# Patient Record
Sex: Female | Born: 1950 | Race: White | Hispanic: No | Smoking: Former smoker
Health system: Southern US, Community
[De-identification: ages and names within clinical notes are randomized; demographics above are authoritative.]

## PROBLEM LIST (undated history)

## (undated) DIAGNOSIS — Z720 Tobacco use: Secondary | ICD-10-CM

## (undated) DIAGNOSIS — E669 Obesity, unspecified: Secondary | ICD-10-CM

## (undated) DIAGNOSIS — J449 Chronic obstructive pulmonary disease, unspecified: Secondary | ICD-10-CM

## (undated) DIAGNOSIS — R519 Headache, unspecified: Secondary | ICD-10-CM

## (undated) DIAGNOSIS — T07XXXA Unspecified multiple injuries, initial encounter: Secondary | ICD-10-CM

## (undated) DIAGNOSIS — M1990 Unspecified osteoarthritis, unspecified site: Secondary | ICD-10-CM

## (undated) DIAGNOSIS — E7800 Pure hypercholesterolemia, unspecified: Secondary | ICD-10-CM

## (undated) DIAGNOSIS — Z8489 Family history of other specified conditions: Secondary | ICD-10-CM

## (undated) DIAGNOSIS — Z8719 Personal history of other diseases of the digestive system: Secondary | ICD-10-CM

## (undated) DIAGNOSIS — I890 Lymphedema, not elsewhere classified: Secondary | ICD-10-CM

## (undated) DIAGNOSIS — G8929 Other chronic pain: Secondary | ICD-10-CM

## (undated) DIAGNOSIS — G43909 Migraine, unspecified, not intractable, without status migrainosus: Secondary | ICD-10-CM

## (undated) DIAGNOSIS — J42 Unspecified chronic bronchitis: Secondary | ICD-10-CM

## (undated) DIAGNOSIS — I82409 Acute embolism and thrombosis of unspecified deep veins of unspecified lower extremity: Secondary | ICD-10-CM

## (undated) DIAGNOSIS — R51 Headache: Secondary | ICD-10-CM

## (undated) DIAGNOSIS — C55 Malignant neoplasm of uterus, part unspecified: Secondary | ICD-10-CM

## (undated) DIAGNOSIS — M549 Dorsalgia, unspecified: Secondary | ICD-10-CM

## (undated) DIAGNOSIS — J189 Pneumonia, unspecified organism: Secondary | ICD-10-CM

## (undated) DIAGNOSIS — K219 Gastro-esophageal reflux disease without esophagitis: Secondary | ICD-10-CM

## (undated) DIAGNOSIS — F40240 Claustrophobia: Secondary | ICD-10-CM

## (undated) DIAGNOSIS — E119 Type 2 diabetes mellitus without complications: Secondary | ICD-10-CM

## (undated) DIAGNOSIS — Z8711 Personal history of peptic ulcer disease: Secondary | ICD-10-CM

## (undated) DIAGNOSIS — I10 Essential (primary) hypertension: Secondary | ICD-10-CM

## (undated) DIAGNOSIS — R32 Unspecified urinary incontinence: Secondary | ICD-10-CM

## (undated) HISTORY — PX: INTRAOPERATIVE ARTERIOGRAM: SHX5157

## (undated) HISTORY — PX: VAGINAL HYSTERECTOMY: SUR661

## (undated) HISTORY — PX: INCONTINENCE SURGERY: SHX676

## (undated) HISTORY — PX: CATARACT EXTRACTION W/ INTRAOCULAR LENS  IMPLANT, BILATERAL: SHX1307

## (undated) HISTORY — DX: Malignant neoplasm of uterus, part unspecified: C55

## (undated) HISTORY — PX: LUMBAR DISC SURGERY: SHX700

## (undated) HISTORY — PX: ESOPHAGOGASTRODUODENOSCOPY: SHX1529

## (undated) HISTORY — PX: COLONOSCOPY: SHX174

## (undated) HISTORY — DX: Unspecified multiple injuries, initial encounter: T07.XXXA

## (undated) HISTORY — PX: BACK SURGERY: SHX140

---

## 2013-10-05 ENCOUNTER — Ambulatory Visit: Payer: Self-pay | Admitting: Podiatry

## 2013-11-30 ENCOUNTER — Encounter: Payer: Self-pay | Admitting: Podiatry

## 2013-11-30 ENCOUNTER — Ambulatory Visit (INDEPENDENT_AMBULATORY_CARE_PROVIDER_SITE_OTHER): Payer: Medicare Other | Admitting: Podiatry

## 2013-11-30 VITALS — BP 101/60 | HR 90 | Resp 12

## 2013-11-30 DIAGNOSIS — M79609 Pain in unspecified limb: Secondary | ICD-10-CM

## 2013-11-30 DIAGNOSIS — B351 Tinea unguium: Secondary | ICD-10-CM

## 2013-11-30 NOTE — Patient Instructions (Signed)
Diabetes and Foot Care Diabetes may cause you to have problems because of poor blood supply (circulation) to your feet and legs. This may cause the skin on your feet to become thinner, break easier, and heal more slowly. Your skin may become dry, and the skin may peel and crack. You may also have nerve damage in your legs and feet causing decreased feeling in them. You may not notice minor injuries to your feet that could lead to infections or more serious problems. Taking care of your feet is one of the most important things you can do for yourself.  HOME CARE INSTRUCTIONS  Wear shoes at all times, even in the house. Do not go barefoot. Bare feet are easily injured.  Check your feet daily for blisters, cuts, and redness. If you cannot see the bottom of your feet, use a mirror or ask someone for help.  Wash your feet with warm water (do not use hot water) and mild soap. Then pat your feet and the areas between your toes until they are completely dry. Do not soak your feet as this can dry your skin.  Apply a moisturizing lotion or petroleum jelly (that does not contain alcohol and is unscented) to the skin on your feet and to dry, brittle toenails. Do not apply lotion between your toes.  Trim your toenails straight across. Do not dig under them or around the cuticle. File the edges of your nails with an emery board or nail file.  Do not cut corns or calluses or try to remove them with medicine.  Wear clean socks or stockings every day. Make sure they are not too tight. Do not wear knee-high stockings since they may decrease blood flow to your legs.  Wear shoes that fit properly and have enough cushioning. To break in new shoes, wear them for just a few hours a day. This prevents you from injuring your feet. Always look in your shoes before you put them on to be sure there are no objects inside.  Do not cross your legs. This may decrease the blood flow to your feet.  If you find a minor scrape,  cut, or break in the skin on your feet, keep it and the skin around it clean and dry. These areas may be cleansed with mild soap and water. Do not cleanse the area with peroxide, alcohol, or iodine.  When you remove an adhesive bandage, be sure not to damage the skin around it.  If you have a wound, look at it several times a day to make sure it is healing.  Do not use heating pads or hot water bottles. They may burn your skin. If you have lost feeling in your feet or legs, you may not know it is happening until it is too late.  Make sure your health care provider performs a complete foot exam at least annually or more often if you have foot problems. Report any cuts, sores, or bruises to your health care provider immediately. SEEK MEDICAL CARE IF:   You have an injury that is not healing.  You have cuts or breaks in the skin.  You have an ingrown nail.  You notice redness on your legs or feet.  You feel burning or tingling in your legs or feet.  You have pain or cramps in your legs and feet.  Your legs or feet are numb.  Your feet always feel cold. SEEK IMMEDIATE MEDICAL CARE IF:   There is increasing redness,   swelling, or pain in or around a wound.  There is a red line that goes up your leg.  Pus is coming from a wound.  You develop a fever or as directed by your health care provider.  You notice a bad smell coming from an ulcer or wound. Document Released: 06/15/2000 Document Revised: 02/18/2013 Document Reviewed: 11/25/2012 [employer] Patient Information 2014 [employer].  

## 2013-11-30 NOTE — Progress Notes (Signed)
   Subjective:    Patient ID: Karen Waters, female    DOB: 1950/11/14, 63 y.o.   MRN: [id number]  HPI  TOENAILS TRIM AND DIABETIC SHOE MEASUREMENT. This patient presents with daughter and is requesting debridement of painful toenails.   Review of Systems  Constitutional: Positive for fatigue.  HENT: Positive for sneezing.   Eyes: Positive for redness and itching.  Respiratory: Positive for cough.   Cardiovascular: Positive for leg swelling.  Endocrine: Positive for cold intolerance and heat intolerance.  Genitourinary: Positive for frequency.  Musculoskeletal: Positive for back pain, gait problem and myalgias.  Allergic/Immunologic: Positive for environmental allergies.  Hematological: Bruises/bleeds easily.  All other systems reviewed and are negative.      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 Will consider this at next visit  Reappoint at three-month intervals

## 2014-02-22 ENCOUNTER — Ambulatory Visit (INDEPENDENT_AMBULATORY_CARE_PROVIDER_SITE_OTHER): Payer: Medicare Other | Admitting: Internal Medicine

## 2014-02-22 ENCOUNTER — Encounter: Payer: Self-pay | Admitting: Internal Medicine

## 2014-02-22 VITALS — BP 120/68 | HR 99 | Ht 64.0 in | Wt 216.0 lb

## 2014-02-22 DIAGNOSIS — R0600 Dyspnea, unspecified: Secondary | ICD-10-CM

## 2014-02-22 DIAGNOSIS — R059 Cough, unspecified: Secondary | ICD-10-CM

## 2014-02-22 DIAGNOSIS — R0989 Other specified symptoms and signs involving the circulatory and respiratory systems: Secondary | ICD-10-CM

## 2014-02-22 DIAGNOSIS — R0609 Other forms of dyspnea: Secondary | ICD-10-CM

## 2014-02-22 DIAGNOSIS — R05 Cough: Secondary | ICD-10-CM

## 2014-02-22 NOTE — Patient Instructions (Signed)
#  Cough and shortness of breath  - do full PFT test next several days  - do ONO test - order through [employer]  - do High Resolution CT chest without contrast on ILD protocol.    #Followup  - complete all of above and return to see my NP Tammy within 2 weeks

## 2014-02-22 NOTE — Progress Notes (Signed)
Subjective:    Patient ID: Karen Waters, female    DOB: 11/26/1950, 63 y.o.   MRN: [id number]  PCP Alvester Chou, NP REferred by DR Harold Hedge - allergist  HPI  IOV 02/22/2014  Chief Complaint  Patient presents with  . Pulmonary Consult    Referred by Dr. Fredderick Phenix for SOB. Pt states with any activity she becomes SOB. Pt does very little daily activity  d/t lymphedema and OA.     63 year old obese female referred by Dr. Harold Hedge for dyspnea and cough evaluation. She has chronic cough. She used to live in [facility] and she is to receive allergy shots today. She recently moved to live in [place] with her daughter and she set up an allergy appointment to get allergy shots. However, due to chronic smoking history and severity of symptoms her allergist as deemed that she should get a pulmonary workup first and therefore she is here.  Dyspnea: Continues insidious onset  dyspnea for several years. Hard pressed to point severity because she is mostly sedentary due to obesity and chronic lymphedema. However, it is clearly exertional for class III distances such as walking from the front door of our office to the examination room. It is relieved by rest. There is no associated chest pain. It is somewhat progressive but overall stable she thinks; again contradictory information. No paroxysmal nocturnal dyspnea or orthopnea. That is associated chronic cough.  Chronic cough: She has severe chronic cough for at least 6 months. She reports she's had bronchitis all her life but chronic cough only for the last 6 months or so. She is unable to determine the differential is within bronchitis and chronic cough. She was on ACE inhibitor and this was changed to losartan but this did not make any difference and cough. Symptoms fluctuated between mild and severe in severity. There is associated white sputum that is mild in amount. Symptoms are present day and night with night sweats as being the worst. It  is associated acid reflux, smoking history. She has a positive sinus history that has flared up now that she's off her allergy shots. She had a chest x-ray several months ago the results of which are not clear. She's never had a sinus CT scan   Dyspnea related hx  Obesity: Body mass index is 37.06 kg/(m^2).  Tobacco:  reports that she has been smoking Cigarettes.  She has a 47 pack-year smoking history. She has never used smokeless tobacco.  Exhaled nitric oxide July 2015: Normal < 5      Past Medical History  Diagnosis Date  . Uterine cancer   . SOB (shortness of breath)      No family history on file.   History   Social History  . Marital Status: Unknown    Spouse Name: N/A    Number of Children: N/A  . Years of Education: N/A   Occupational History  . retired    Social History Main Topics  . Smoking status: Current Every Day Smoker -- 1.00 packs/day for 47 years    Types: Cigarettes  . Smokeless tobacco: Never Used  . Alcohol Use: No  . Drug Use: No  . Sexual Activity: Not on file   Other Topics Concern  . Not on file   Social History Narrative  . No narrative on file     Allergies  Allergen Reactions  . Vicodin [Hydrocodone-Acetaminophen]      Outpatient Prescriptions Prior to Visit  Medication Sig Dispense Refill  .  atorvastatin (LIPITOR) 20 MG tablet 1/2 tablet qd      . COUMADIN 5 MG tablet Take by mouth. 5mg  on 1 days each week  2.5mg  on 6 days      . DEXILANT 60 MG capsule       . fluticasone (FLONASE) 50 MCG/ACT nasal spray       . furosemide (LASIX) 40 MG tablet Take by mouth daily. 1.5 tablets qd      . montelukast (SINGULAIR) 10 MG tablet       . potassium chloride (MICRO-K) 10 MEQ CR capsule       . glipiZIDE (GLUCOTROL) 10 MG tablet       . hydrochlorothiazide (HYDRODIURIL) 50 MG tablet       . HYDROcodone-acetaminophen (NORCO) 7.5-325 MG per tablet       . lisinopril (PRINIVIL,ZESTRIL) 2.5 MG tablet       . losartan (COZAAR) 25 MG  tablet       . metFORMIN (GLUCOPHAGE) 1000 MG tablet       . tiZANidine (ZANAFLEX) 4 MG tablet        No facility-administered medications prior to visit.       Review of Systems  Constitutional: Negative for fever and unexpected weight change.  HENT: Negative for congestion, dental problem, ear pain, nosebleeds, postnasal drip, rhinorrhea, sinus pressure, sneezing, sore throat and trouble swallowing.   Eyes: Negative for redness and itching.  Respiratory: Positive for shortness of breath. Negative for cough, chest tightness and wheezing.   Cardiovascular: Positive for leg swelling. Negative for palpitations.  Gastrointestinal: Negative for nausea and vomiting.  Genitourinary: Negative for dysuria.  Musculoskeletal: Negative for joint swelling.  Skin: Negative for rash.  Neurological: Negative for headaches.  Hematological: Does not bruise/bleed easily.  Psychiatric/Behavioral: Negative for dysphoric mood. The patient is not nervous/anxious.        Objective:   Physical Exam  Vitals reviewed. Constitutional: She is oriented to person, place, and time. She appears well-developed and well-nourished. No distress.  Body mass index is 37.06 kg/(m^2).  Siting in wheel chair  HENT:  Head: Normocephalic and atraumatic.  Right Ear: External ear normal.  Left Ear: External ear normal.  Mouth/Throat: Oropharynx is clear and moist. No oropharyngeal exudate.  Coughs a lot  Eyes: Conjunctivae and EOM are normal. Pupils are equal, round, and reactive to light. Right eye exhibits no discharge. Left eye exhibits no discharge. No scleral icterus.  Neck: Normal range of motion. Neck supple. No JVD present. No tracheal deviation present. No thyromegaly present.  Cardiovascular: Normal rate, regular rhythm, normal heart sounds and intact distal pulses.  Exam reveals no gallop and no friction rub.   No murmur heard. Pulmonary/Chest: Effort normal and breath sounds normal. No respiratory  distress. She has no wheezes. She has no rales. She exhibits no tenderness.  Abdominal: Soft. Bowel sounds are normal. She exhibits no distension and no mass. There is no tenderness. There is no rebound and no guarding.  Musculoskeletal: Normal range of motion. She exhibits edema. She exhibits no tenderness.  chrnic lymphedema +  Lymphadenopathy:    She has no cervical adenopathy.  Neurological: She is alert and oriented to person, place, and time. She has normal reflexes. No cranial nerve deficit. She exhibits normal muscle tone. Coordination normal.  Skin: Skin is warm and dry. No rash noted. She is not diaphoretic. No erythema. No pallor.  Psychiatric: She has a normal mood and affect. Her behavior is normal. Judgment and thought content  normal.    Filed Vitals:   02/22/14 1530  BP: 120/68  Pulse: 99  Height: 5\' 4"  (1.626 m)  Weight: 216 lb (97.977 kg)  SpO2: 91%          Assessment & Plan:  #Cough and shortness of breath - wide range of possibliities and includes obesity, deconditioning, diastolic dysfunction and smoking related lung diseases such as copd, ild  - do full PFT test next several days  - do ONO test - order through [employer]  - do High Resolution CT chest without contrast on ILD protocol.    #Followup  - complete all of above and return to see my NP Tammy within 2 weeks

## 2014-02-26 ENCOUNTER — Telehealth: Payer: Self-pay | Admitting: Internal Medicine

## 2014-02-26 NOTE — Telephone Encounter (Signed)
On o 02/25/14 - shows pulse ox < 88% for total consecutive 5.66minutes and low pulse ox 73% and time < 88% at 233 min  She qualifies for 2L O2.   Let her know that; can start  Now or when she returns for followup  Thanks  Dr. Brand Males, M.D., [facility].C.P Pulmonary and Critical Care Medicine Staff Physician [place] [facility] Pager: [phone], If no answer or between  15:00h - 7:00h: call [phone]  02/26/2014 5:31 PM

## 2014-02-26 NOTE — Telephone Encounter (Signed)
ONO ordered at the 8.24.15 and just done last night Called [employer] and spoke with Jeneen Rinks who reported that he already called the office earlier today and spoke with [facility] to ensure that pt's ONO was received and given to MR.  The pt does qualify for nocturnal O2.  Spoke with Golden [facility], she did receive the ONO and gave it to MR  MR please advise on ONO results, thank you.

## 2014-03-01 ENCOUNTER — Ambulatory Visit: Payer: Medicare Other | Admitting: Podiatry

## 2014-03-01 ENCOUNTER — Telehealth: Payer: Self-pay | Admitting: Internal Medicine

## 2014-03-01 ENCOUNTER — Ambulatory Visit (INDEPENDENT_AMBULATORY_CARE_PROVIDER_SITE_OTHER)
Admission: RE | Admit: 2014-03-01 | Discharge: 2014-03-01 | Disposition: A | Discharge status: Home / Self Care | Payer: Medicare Other | Source: Ambulatory Visit | Attending: Internal Medicine | Admitting: Internal Medicine

## 2014-03-01 ENCOUNTER — Ambulatory Visit (HOSPITAL_COMMUNITY)
Admission: RE | Admit: 2014-03-01 | Discharge: 2014-03-01 | Disposition: A | Discharge status: Home / Self Care | Payer: Medicare Other | Source: Ambulatory Visit | Attending: Internal Medicine | Admitting: Internal Medicine

## 2014-03-01 DIAGNOSIS — R059 Cough, unspecified: Secondary | ICD-10-CM | POA: Insufficient documentation

## 2014-03-01 DIAGNOSIS — R0609 Other forms of dyspnea: Secondary | ICD-10-CM

## 2014-03-01 DIAGNOSIS — R0989 Other specified symptoms and signs involving the circulatory and respiratory systems: Secondary | ICD-10-CM | POA: Diagnosis not present

## 2014-03-01 DIAGNOSIS — R0600 Dyspnea, unspecified: Secondary | ICD-10-CM | POA: Insufficient documentation

## 2014-03-01 DIAGNOSIS — R05 Cough: Secondary | ICD-10-CM | POA: Diagnosis present

## 2014-03-01 LAB — PULMONARY FUNCTION TEST
DL/VA % PRED: 74 %
DL/VA: 3.58 ml/min/mmHg/L
DLCO unc % pred: 47 %
DLCO unc: 11.44 ml/min/mmHg
FEF 25-75 PRE: 1.56 L/s
FEF 25-75 Post: 2.33 L/sec
FEF2575-%CHANGE-POST: 49 %
FEF2575-%PRED-PRE: 73 %
FEF2575-%Pred-Post: 110 %
FEV1-%Change-Post: 21 %
FEV1-%Pred-Post: 81 %
FEV1-%Pred-Pre: 66 %
FEV1-PRE: 1.6 L
FEV1-Post: 1.95 L
FEV1FVC-%Change-Post: 0 %
FEV1FVC-%PRED-PRE: 103 %
FEV6-%CHANGE-POST: 21 %
FEV6-%PRED-POST: 80 %
FEV6-%Pred-Pre: 66 %
FEV6-POST: 2.43 L
FEV6-Pre: 2.01 L
FEV6FVC-%Pred-Post: 104 %
FEV6FVC-%Pred-Pre: 104 %
FVC-%Change-Post: 21 %
FVC-%PRED-POST: 77 %
FVC-%PRED-PRE: 63 %
FVC-POST: 2.43 L
FVC-Pre: 2.01 L
PRE FEV1/FVC RATIO: 80 %
Post FEV1/FVC ratio: 80 %
Post FEV6/FVC ratio: 100 %
Pre FEV6/FVC Ratio: 100 %
RV % pred: 92 %
RV: 1.95 L
TLC % pred: 78 %
TLC: 3.97 L

## 2014-03-01 MED ORDER — ALBUTEROL SULFATE (2.5 MG/3ML) 0.083% IN NEBU
2.5000 mg | INHALATION_SOLUTION | Freq: Once | RESPIRATORY_TRACT | Status: AC
  Administered 2014-03-01: 2.5 mg via RESPIRATORY_TRACT

## 2014-03-01 NOTE — Telephone Encounter (Signed)
Pt states that during ONO she was coughing a lot. Pt wanting to know if this could have any affect in the oxygen level outcome of the test? Please advise Dr Chase Caller.  Karen Males, MD at 02/26/2014 5:30 PM     Status: Signed        On o 02/25/14 - shows pulse ox < 88% for total consecutive 5.40minutes and low pulse ox 73% and time < 88% at 233 min  She qualifies for 2L O2.   Let her know that; can start Now or when she returns for followup  Thanks

## 2014-03-01 NOTE — Telephone Encounter (Signed)
It is quite possible if she was coughing a lot and had finger probe that destats was due to cough. It could be genuine too.   Best finish workup. Till then no interventions.    I will review her CT chest 03/01/2014 nexdt 48h and we can get back to her  Thanks  Dr. Brand Males, M.D., [facility]C.P Pulmonary and Critical Care Medicine Staff Physician [place] [facility] Pager: [phone], If no answer or between  15:00h - 7:00h: call [phone]  03/01/2014 10:26 AM

## 2014-03-01 NOTE — Telephone Encounter (Signed)
Results have been explained to patient, pt expressed understanding. Pt does not wish to start O2 at this time--wishes to wait until OV with TP 03/22/14 to discuss results further.  Pt is not understanding why she is needing O2 at night if she is not having issues with her breathing. I attempted to explain this to the patient and she requests to wait until OV to start. Will send to MR as FYI.   Nothing further needed.

## 2014-03-01 NOTE — Telephone Encounter (Signed)
Pt aware of rec's per MR Will await results from our office.  Nothing further needed.

## 2014-03-11 NOTE — Progress Notes (Signed)
Quick Note:  lmtcb ______ 

## 2014-03-12 ENCOUNTER — Telehealth: Payer: Self-pay | Admitting: Internal Medicine

## 2014-03-15 NOTE — Telephone Encounter (Signed)
Called and spoke with pt and she is aware of results per MR.  She will keep the appt with TP on 9/21.

## 2014-03-15 NOTE — Telephone Encounter (Signed)
Patient returning call about CT results.  [phone]

## 2014-03-16 NOTE — Progress Notes (Signed)
Quick Note:  Called and spoke to pt. Informed pt of results and recs per MR. Pt verbalized understanding and denied any further questions or concerns at this time. ______ 

## 2014-03-22 ENCOUNTER — Ambulatory Visit (INDEPENDENT_AMBULATORY_CARE_PROVIDER_SITE_OTHER): Payer: Medicare Other | Admitting: Adult Health

## 2014-03-22 ENCOUNTER — Encounter: Payer: Self-pay | Admitting: Adult Health

## 2014-03-22 VITALS — BP 114/62 | HR 112 | Temp 98.8°F | Ht 64.0 in | Wt 210.0 lb

## 2014-03-22 DIAGNOSIS — R05 Cough: Secondary | ICD-10-CM

## 2014-03-22 DIAGNOSIS — R0989 Other specified symptoms and signs involving the circulatory and respiratory systems: Secondary | ICD-10-CM

## 2014-03-22 DIAGNOSIS — R918 Other nonspecific abnormal finding of lung field: Secondary | ICD-10-CM

## 2014-03-22 DIAGNOSIS — R059 Cough, unspecified: Secondary | ICD-10-CM

## 2014-03-22 DIAGNOSIS — R0609 Other forms of dyspnea: Secondary | ICD-10-CM

## 2014-03-22 DIAGNOSIS — R0600 Dyspnea, unspecified: Secondary | ICD-10-CM

## 2014-03-22 DIAGNOSIS — R911 Solitary pulmonary nodule: Secondary | ICD-10-CM

## 2014-03-22 NOTE — Patient Instructions (Signed)
May resume Allergy shots as directed by your Allergist.  May use Allegra 180mg  daily As needed drainage  Delsym 2 tsp Twice daily  As needed  Cough  Saline nasal rinses As needed   Return in 2 months with Dr. Chase Caller for spirometry .  Will need CT chest without contrast in 6 months to follow lung nodules.  Follow up with Cardiology regarding your CT that showed plaque buildup in your arteries as we discussed.  Please contact office for sooner follow up if symptoms do not improve or worsen or seek emergency care

## 2014-03-25 DIAGNOSIS — R918 Other nonspecific abnormal finding of lung field: Secondary | ICD-10-CM | POA: Insufficient documentation

## 2014-03-25 NOTE — Progress Notes (Signed)
Subjective:    Patient ID: Karen Waters, female    DOB: [birth year], 63 y.o.   MRN: [id number]  PCP Alvester Chou, NP REferred by DR Harold Hedge - allergist  HPI  IOV 02/22/2014  Chief Complaint  Patient presents with  . Pulmonary Consult    Referred by Dr. Fredderick Phenix for SOB. Pt states with any activity she becomes SOB. Pt does very little daily activity  d/t lymphedema and OA.     63 year old obese female referred by Dr. Harold Hedge for dyspnea and cough evaluation. She has chronic cough. She used to live in [facility] and she is to receive allergy shots today. She recently moved to live in [place] with her daughter and she set up an allergy appointment to get allergy shots. However, due to chronic smoking history and severity of symptoms her allergist as deemed that she should get a pulmonary workup first and therefore she is here.  Dyspnea: Continues insidious onset  dyspnea for several years. Hard pressed to point severity because she is mostly sedentary due to obesity and chronic lymphedema. However, it is clearly exertional for class III distances such as walking from the front door of our office to the examination room. It is relieved by rest. There is no associated chest pain. It is somewhat progressive but overall stable she thinks; again contradictory information. No paroxysmal nocturnal dyspnea or orthopnea. That is associated chronic cough.  Chronic cough: She has severe chronic cough for at least 6 months. She reports she's had bronchitis all her life but chronic cough only for the last 6 months or so. She is unable to determine the differential is within bronchitis and chronic cough. She was on ACE inhibitor and this was changed to losartan but this did not make any difference and cough. Symptoms fluctuated between mild and severe in severity. There is associated white sputum that is mild in amount. Symptoms are present day and night with night sweats as being the worst. It  is associated acid reflux, smoking history. She has a positive sinus history that has flared up now that she's off her allergy shots. She had a chest x-ray several months ago the results of which are not clear. She's never had a sinus CT scan   Dyspnea related hx  Obesity: Body mass index is 37.06 kg/(m^2).  Tobacco:  reports that she has been smoking Cigarettes.  She has a 47 pack-year smoking history. She has never used smokeless tobacco.   03/22/14  Follow up Cough and Dyspnea Returns for 1 month follow up  Seen last month for pulmonary consult for dyspnea and cough . Pt has hx of smoking .  Was sen for CT chest -High Res to look for possible ILD changes and other abnormalities. CT on 03/01/14 showed  NO evidence of ILD .  There was scattered pulmonary nodules up to 6 mm in RLL  We discussed serial follow up over next 2 years w/ next CT planned in 6 months, she is in agreement.  CT did show 3 vessel coronary calcification, we discussed this findings and she says she has had 2 cardiac stress test in past that were neg. These records are not available . Advised her to discuss finding with her PCP for risk factor management and determine if further testing is indicated.  She is in agreement.  Pt had PFT on 03/01/14 was difficult to interpret with coughing and ? VCD component. We talked about getting her cough under better control and  repeating spirometry in office.  Pt still has cough that is mainly dry and lots of sinus drainage. Previously on ACE and ARB but now she is off but still has cough . Complains of drainage down her throat.  Wants to return to her allergy shots that she has been on hold for. No fever, chest pain, orthopnea, edema or n/v/d.  We discussed cough control along with trigger management.  ONO showed minimal desats ~5 min <88% however pt says coughed during most of test .    Review of Systems  Constitutional: Negative for fever and unexpected weight change.  HENT:  Negative for congestion, dental problem, ear pain, nosebleeds, postnasal drip, rhinorrhea, sinus pressure, sneezing, sore throat and trouble swallowing.   Eyes: Negative for redness and itching.  Respiratory: Positive for shortness of breath. Negative for cough, chest tightness and wheezing.   Cardiovascular: Positive for leg swelling. Negative for palpitations.  Gastrointestinal: Negative for nausea and vomiting.  Genitourinary: Negative for dysuria.  Musculoskeletal: Negative for joint swelling.  Skin: Negative for rash.  Neurological: Negative for headaches.  Hematological: Does not bruise/bleed easily.  Psychiatric/Behavioral: Negative for dysphoric mood. The patient is not nervous/anxious.        Objective:   Physical Exam  Vitals reviewed. Constitutional: She is oriented to person, place, and time. Obese . No distress.  HENT:  Head: Normocephalic and atraumatic.  Right Ear: External ear normal.  Left Ear: External ear normal.  Mouth/Throat: Oropharynx is clear and moist. No oropharyngeal exudate.  Eyes: Conjunctivae and EOM are normal. Pupils are equal, round, and reactive to light. Right eye exhibits no discharge. Left eye exhibits no discharge. No scleral icterus.  Neck: Normal range of motion. Neck supple. No JVD present. No tracheal deviation present. No thyromegaly present.  Cardiovascular: Normal rate, regular rhythm, normal heart sounds and intact distal pulses.  Exam reveals no gallop and no friction rub.   No murmur heard. Pulmonary/Chest: Effort normal and breath sounds normal. No respiratory distress. She has no wheezes. She has no rales. She exhibits no tenderness.  Abdominal: Soft. Bowel sounds are normal. She exhibits no distension and no mass. There is no tenderness. There is no rebound and no guarding.  Musculoskeletal: Normal range of motion. She exhibits edema. She exhibits no tenderness.  chronic lymphedema + -along  Lymphadenopathy: none    She has no  cervical adenopathy.  Neurological: She is alert and oriented to person, place, and time. She has normal reflexes. No cranial nerve deficit. She exhibits normal muscle tone. Coordination normal.  Skin: Skin is warm and dry. No rash noted. She is not diaphoretic. No erythema. No pallor.  Psychiatric: She has a normal mood and affect. Her behavior is normal. Judgment and thought content normal.         Assessment & Plan:

## 2014-03-25 NOTE — Assessment & Plan Note (Signed)
CT on 03/01/14 showed NO evidence of ILD . There was scattered pulmonary nodules up to 6 mm in RLL  Repeat CT chest ~08/2014 >>

## 2014-03-25 NOTE — Assessment & Plan Note (Signed)
Suspect is multifactoral with no evidence of ILD on CT and desaturations noted.  Will get cough and drainage under better control and then repeat Spirometry   Plan  May resume Allergy shots as directed by your Allergist.  May use Allegra 180mg  daily As needed drainage  Delsym 2 tsp Twice daily  As needed  Cough  Saline nasal rinses As needed   Return in 2 months with Dr. Chase Caller for spirometry .

## 2014-03-25 NOTE — Assessment & Plan Note (Addendum)
Upper airway cough syndrome w/ suspected triggers of rhinitis and GERD  Plan  May resume Allergy shots as directed by your Allergist.  May use Allegra 180mg  daily As needed drainage  Delsym 2 tsp Twice daily  As needed  Cough  Saline nasal rinses As needed   Return in 2 months with Dr. Chase Caller for spirometry .

## 2014-03-29 ENCOUNTER — Ambulatory Visit: Payer: Medicare Other | Admitting: Podiatry

## 2014-04-12 ENCOUNTER — Ambulatory Visit: Payer: Medicare Other | Admitting: Podiatry

## 2014-05-13 ENCOUNTER — Telehealth: Payer: Self-pay | Admitting: *Deleted

## 2014-05-13 NOTE — Telephone Encounter (Signed)
Erroneous encounter

## 2014-06-12 ENCOUNTER — Encounter (HOSPITAL_COMMUNITY): Payer: Self-pay

## 2014-06-12 ENCOUNTER — Observation Stay (HOSPITAL_COMMUNITY)
Admission: EM | Admit: 2014-06-12 | Discharge: 2014-06-15 | Disposition: A | Discharge status: Home / Self Care | Payer: Medicare Other | Attending: Internal Medicine | Admitting: Internal Medicine

## 2014-06-12 ENCOUNTER — Emergency Department (HOSPITAL_COMMUNITY): Payer: Medicare Other

## 2014-06-12 ENCOUNTER — Observation Stay (HOSPITAL_COMMUNITY): Payer: Medicare Other

## 2014-06-12 DIAGNOSIS — R0600 Dyspnea, unspecified: Secondary | ICD-10-CM

## 2014-06-12 DIAGNOSIS — R51 Headache: Secondary | ICD-10-CM | POA: Insufficient documentation

## 2014-06-12 DIAGNOSIS — Z79899 Other long term (current) drug therapy: Secondary | ICD-10-CM | POA: Insufficient documentation

## 2014-06-12 DIAGNOSIS — R200 Anesthesia of skin: Secondary | ICD-10-CM | POA: Diagnosis not present

## 2014-06-12 DIAGNOSIS — Z720 Tobacco use: Secondary | ICD-10-CM | POA: Insufficient documentation

## 2014-06-12 DIAGNOSIS — M79603 Pain in arm, unspecified: Secondary | ICD-10-CM

## 2014-06-12 DIAGNOSIS — Z8541 Personal history of malignant neoplasm of cervix uteri: Secondary | ICD-10-CM | POA: Diagnosis not present

## 2014-06-12 DIAGNOSIS — M79602 Pain in left arm: Secondary | ICD-10-CM | POA: Diagnosis not present

## 2014-06-12 DIAGNOSIS — R55 Syncope and collapse: Secondary | ICD-10-CM | POA: Diagnosis not present

## 2014-06-12 DIAGNOSIS — Z86718 Personal history of other venous thrombosis and embolism: Secondary | ICD-10-CM | POA: Diagnosis not present

## 2014-06-12 DIAGNOSIS — I2510 Atherosclerotic heart disease of native coronary artery without angina pectoris: Secondary | ICD-10-CM | POA: Diagnosis present

## 2014-06-12 DIAGNOSIS — G500 Trigeminal neuralgia: Secondary | ICD-10-CM

## 2014-06-12 HISTORY — DX: Lymphedema, not elsewhere classified: I89.0

## 2014-06-12 HISTORY — DX: Acute embolism and thrombosis of unspecified deep veins of unspecified lower extremity: I82.409

## 2014-06-12 HISTORY — DX: Tobacco use: Z72.0

## 2014-06-12 HISTORY — DX: Obesity, unspecified: E66.9

## 2014-06-12 LAB — PROTIME-INR
INR: 2.38 — AB (ref 0.00–1.49)
PROTHROMBIN TIME: 26.2 s — AB (ref 11.6–15.2)

## 2014-06-12 LAB — I-STAT CHEM 8, ED
BUN: 27 mg/dL — ABNORMAL HIGH (ref 6–23)
Calcium, Ion: 1.07 mmol/L — ABNORMAL LOW (ref 1.13–1.30)
Chloride: 101 mEq/L (ref 96–112)
Creatinine, Ser: 1.1 mg/dL (ref 0.50–1.10)
Glucose, Bld: 143 mg/dL — ABNORMAL HIGH (ref 70–99)
HCT: 40 % (ref 36.0–46.0)
HEMOGLOBIN: 13.6 g/dL (ref 12.0–15.0)
Potassium: 3 mEq/L — ABNORMAL LOW (ref 3.7–5.3)
SODIUM: 140 meq/L (ref 137–147)
TCO2: 26 mmol/L (ref 0–100)

## 2014-06-12 LAB — BASIC METABOLIC PANEL
ANION GAP: 15 (ref 5–15)
BUN: 27 mg/dL — ABNORMAL HIGH (ref 6–23)
CO2: 27 mEq/L (ref 19–32)
Calcium: 9 mg/dL (ref 8.4–10.5)
Chloride: 99 mEq/L (ref 96–112)
Creatinine, Ser: 0.96 mg/dL (ref 0.50–1.10)
GFR, EST AFRICAN AMERICAN: 70 mL/min — AB (ref >90)
GFR, EST NON AFRICAN AMERICAN: 61 mL/min — AB (ref >90)
Glucose, Bld: 139 mg/dL — ABNORMAL HIGH (ref 70–99)
POTASSIUM: 3.2 meq/L — AB (ref 3.7–5.3)
SODIUM: 141 meq/L (ref 137–147)

## 2014-06-12 LAB — CBC
HCT: 37.4 % (ref 36.0–46.0)
Hemoglobin: 12.3 g/dL (ref 12.0–15.0)
MCH: 28.5 pg (ref 26.0–34.0)
MCHC: 32.9 g/dL (ref 30.0–36.0)
MCV: 86.8 fL (ref 78.0–100.0)
Platelets: 279 10*3/uL (ref 150–400)
RBC: 4.31 MIL/uL (ref 3.87–5.11)
RDW: 14.4 % (ref 11.5–15.5)
WBC: 9.6 10*3/uL (ref 4.0–10.5)

## 2014-06-12 LAB — LIPID PANEL
Cholesterol: 187 mg/dL (ref 0–200)
HDL: 39 mg/dL — AB (ref >39)
LDL CALC: 129 mg/dL — AB (ref 0–99)
Total CHOL/HDL Ratio: 4.8 RATIO
Triglycerides: 96 mg/dL (ref <150)
VLDL: 19 mg/dL (ref 0–40)

## 2014-06-12 LAB — GLUCOSE, CAPILLARY
GLUCOSE-CAPILLARY: 158 mg/dL — AB (ref 70–99)
Glucose-Capillary: 126 mg/dL — ABNORMAL HIGH (ref 70–99)

## 2014-06-12 LAB — CBG MONITORING, ED: Glucose-Capillary: 121 mg/dL — ABNORMAL HIGH (ref 70–99)

## 2014-06-12 LAB — HIV ANTIBODY (ROUTINE TESTING W REFLEX): HIV 1&2 Ab, 4th Generation: NONREACTIVE

## 2014-06-12 LAB — TROPONIN I

## 2014-06-12 LAB — PRO B NATRIURETIC PEPTIDE: Pro B Natriuretic peptide (BNP): 235.8 pg/mL — ABNORMAL HIGH (ref 0–125)

## 2014-06-12 MED ORDER — HYDROMORPHONE HCL 1 MG/ML IJ SOLN
1.0000 mg | Freq: Once | INTRAMUSCULAR | Status: AC
  Administered 2014-06-12: 1 mg via INTRAVENOUS
  Filled 2014-06-12: qty 1

## 2014-06-12 MED ORDER — MONTELUKAST SODIUM 10 MG PO TABS
10.0000 mg | ORAL_TABLET | Freq: Every day | ORAL | Status: DC
  Administered 2014-06-12 – 2014-06-14 (×3): 10 mg via ORAL
  Filled 2014-06-12 (×3): qty 1

## 2014-06-12 MED ORDER — ONDANSETRON HCL 4 MG/2ML IJ SOLN: 4.0000 mg | Freq: Four times a day (QID) | INTRAMUSCULAR | Status: DC | PRN

## 2014-06-12 MED ORDER — ATORVASTATIN CALCIUM 10 MG PO TABS
10.0000 mg | ORAL_TABLET | Freq: Every day | ORAL | Status: DC
  Administered 2014-06-12 – 2014-06-14 (×3): 10 mg via ORAL
  Filled 2014-06-12 (×3): qty 1

## 2014-06-12 MED ORDER — PANTOPRAZOLE SODIUM 40 MG PO TBEC
40.0000 mg | DELAYED_RELEASE_TABLET | Freq: Every day | ORAL | Status: DC
  Administered 2014-06-12 – 2014-06-15 (×4): 40 mg via ORAL
  Filled 2014-06-12 (×4): qty 1

## 2014-06-12 MED ORDER — GUAIFENESIN-DM 100-10 MG/5ML PO SYRP: 5.0000 mL | ORAL_SOLUTION | ORAL | Status: DC | PRN

## 2014-06-12 MED ORDER — MORPHINE SULFATE 4 MG/ML IJ SOLN
4.0000 mg | Freq: Once | INTRAMUSCULAR | Status: AC
  Administered 2014-06-12: 4 mg via INTRAVENOUS
  Filled 2014-06-12: qty 1

## 2014-06-12 MED ORDER — ADULT MULTIVITAMIN W/MINERALS CH
1.0000 | ORAL_TABLET | Freq: Every day | ORAL | Status: DC
  Administered 2014-06-12 – 2014-06-15 (×4): 1 via ORAL
  Filled 2014-06-12 (×4): qty 1

## 2014-06-12 MED ORDER — WARFARIN - PHARMACIST DOSING INPATIENT: Freq: Every day | Status: DC

## 2014-06-12 MED ORDER — SALINE SPRAY 0.65 % NA SOLN
1.0000 | Freq: Four times a day (QID) | NASAL | Status: DC
  Administered 2014-06-13 – 2014-06-15 (×9): 1 via NASAL
  Filled 2014-06-12: qty 44

## 2014-06-12 MED ORDER — WARFARIN SODIUM 5 MG PO TABS
5.0000 mg | ORAL_TABLET | Freq: Once | ORAL | Status: AC
  Administered 2014-06-12: 5 mg via ORAL
  Filled 2014-06-12: qty 1

## 2014-06-12 MED ORDER — ONDANSETRON HCL 4 MG/2ML IJ SOLN
4.0000 mg | Freq: Once | INTRAMUSCULAR | Status: AC
  Administered 2014-06-12: 4 mg via INTRAVENOUS
  Filled 2014-06-12: qty 2

## 2014-06-12 MED ORDER — SODIUM CHLORIDE 0.9 % IV BOLUS (SEPSIS)
1000.0000 mL | Freq: Once | INTRAVENOUS | Status: AC
  Administered 2014-06-12: 1000 mL via INTRAVENOUS

## 2014-06-12 MED ORDER — POTASSIUM CHLORIDE CRYS ER 20 MEQ PO TBCR
40.0000 meq | EXTENDED_RELEASE_TABLET | Freq: Once | ORAL | Status: AC
  Administered 2014-06-12: 40 meq via ORAL
  Filled 2014-06-12: qty 2

## 2014-06-12 MED ORDER — INSULIN ASPART 100 UNIT/ML ~~LOC~~ SOLN
0.0000 [IU] | Freq: Three times a day (TID) | SUBCUTANEOUS | Status: DC
  Administered 2014-06-12: 2 [IU] via SUBCUTANEOUS
  Administered 2014-06-13: 1 [IU] via SUBCUTANEOUS
  Administered 2014-06-14: 2 [IU] via SUBCUTANEOUS

## 2014-06-12 MED ORDER — FLUTICASONE PROPIONATE 50 MCG/ACT NA SUSP: 2.0000 | Freq: Every day | NASAL | Status: DC | PRN

## 2014-06-12 MED ORDER — IOHEXOL 350 MG/ML SOLN
80.0000 mL | Freq: Once | INTRAVENOUS | Status: AC | PRN
  Administered 2014-06-12: 80 mL via INTRAVENOUS

## 2014-06-12 NOTE — ED Provider Notes (Signed)
CSN: [id number]     Arrival date & time 06/12/14  8250 History   First MD Initiated Contact with Patient 06/12/14 [phone]     Chief Complaint  Patient presents with  . Facial Pain     (Consider location/radiation/quality/duration/timing/severity/associated sxs/prior Treatment) HPI Comments: Patient is a 63 year old female with a past medical history of uterine cancer and DVT who presents after a presyncopal episode that occurred prior to arrival. Patient reports walking to the bathroom this morning when she suddenly felt lightheaded and "passed out." Patient's daughter witnessed the episode and lowered the patient to the floor. She did not fall. No head trauma or LOC. Patient remembers the events and denies fully losing consciousness. Patient also reports left arm pain since yesterday. The pain is aching and severe with radiation down her arm. No injury. Patient reports associated left sided facial pain. She reports changing medication from Xarelto to coumadin 1 month ago. She denies any arm weakness but feels the use is limited due to pain. No alleviating factors.    Past Medical History  Diagnosis Date  . Uterine cancer   . SOB (shortness of breath)   . DVT (deep venous thrombosis)    Past Surgical History  Procedure Laterality Date  . Tubal ligation    . Abdominal hysterectomy    . Back surgery    . Cystocele repair     No family history on file. History  Substance Use Topics  . Smoking status: Current Every Day Smoker -- 1.00 packs/day for 47 years    Types: Cigarettes  . Smokeless tobacco: Never Used  . Alcohol Use: No   OB History    No data available     Review of Systems  HENT:       Facial pain  Musculoskeletal: Positive for arthralgias.  Neurological: Positive for light-headedness.  All other systems reviewed and are negative.     Allergies  Darvon; Percocet; and Valium  Home Medications   Prior to Admission medications   Medication Sig Start Date End  Date Taking? Authorizing Provider  atorvastatin (LIPITOR) 20 MG tablet 1/2 tablet qd 11/11/13   Historical Provider, MD  B Complex Vitamins (B-COMPLEX/B-12 PO) Take 1 tablet by mouth daily.    Historical Provider, MD  COUMADIN 5 MG tablet Take by mouth. 5mg  on 1 days each week  2.5mg  on 6 days 11/11/13   Historical Provider, MD  DEXILANT 60 MG capsule Take 60 mg by mouth daily.  11/11/13   Historical Provider, MD  fluticasone (FLONASE) 50 MCG/ACT nasal spray Place 2 sprays into both nostrils daily.  11/11/13   Historical Provider, MD  furosemide (LASIX) 40 MG tablet 1.5 tablets once daily 11/11/13   Historical Provider, MD  guaiFENesin-dextromethorphan (ROBITUSSIN DM) 100-10 MG/5ML syrup Take 5 mLs by mouth every 4 (four) hours as needed for cough.    Historical Provider, MD  montelukast (SINGULAIR) 10 MG tablet Take 10 mg by mouth at bedtime.  11/11/13   Historical Provider, MD  potassium chloride (MICRO-K) 10 MEQ CR capsule  11/11/13   Historical Provider, MD  PROAIR HFA 108 (90 BASE) MCG/ACT inhaler Inhale into the lungs as needed.    Historical Provider, MD  triamterene-hydrochlorothiazide (MAXZIDE) 75-50 MG per tablet Take 1 tablet by mouth daily.    Historical Provider, MD   BP 119/57 mmHg  Pulse 89  Temp(Src) 98.5 F (36.9 C) (Oral)  Resp 20  Ht 5\' 5"  (1.651 m)  Wt 200 lb (90.719 kg)  BMI 33.28 kg/m2  SpO2 97% Physical Exam  Constitutional: She is oriented to person, place, and time. She appears well-developed and well-nourished. No distress.  HENT:  Head: Normocephalic and atraumatic.  Mouth/Throat: Oropharynx is clear and moist. No oropharyngeal exudate.  Left facial tenderness to palpation. No edema or obvious deformity.   Eyes: Conjunctivae and EOM are normal. Pupils are equal, round, and reactive to light.  Neck: Normal range of motion.  Cardiovascular: Normal rate and regular rhythm.  Exam reveals no gallop and no friction rub.   No murmur heard. Pulmonary/Chest: Effort normal  and breath sounds normal. She has no wheezes. She has no rales. She exhibits no tenderness.  Abdominal: Soft. She exhibits no distension. There is no tenderness. There is no rebound.  Musculoskeletal: Normal range of motion.  Left wrist and forearm tender to palpation. No obvious deformity. Limited ROM due to pain.  Neurological: She is alert and oriented to person, place, and time. No cranial nerve deficit. Coordination normal.  Extremity strength and sensation equal and intact bilaterally. Speech is goal-oriented. Moves limbs without ataxia.   Skin: Skin is warm and dry.  Psychiatric: She has a normal mood and affect. Her behavior is normal.  Nursing note and vitals reviewed.   ED Course  Procedures (including critical care time) Labs Review Labs Reviewed  BASIC METABOLIC PANEL - Abnormal; Notable for the following:    Potassium 3.2 (*)    Glucose, Bld 139 (*)    BUN 27 (*)    GFR calc non Af Amer 61 (*)    GFR calc Af Amer 70 (*)    All other components within normal limits  PROTIME-INR - Abnormal; Notable for the following:    Prothrombin Time 26.2 (*)    INR 2.38 (*)    All other components within normal limits  I-STAT CHEM 8, ED - Abnormal; Notable for the following:    Potassium 3.0 (*)    BUN 27 (*)    Glucose, Bld 143 (*)    Calcium, Ion 1.07 (*)    All other components within normal limits  CBC  URINALYSIS, ROUTINE W REFLEX MICROSCOPIC    Imaging Review Dg Forearm Left  06/12/2014   CLINICAL DATA:  63 year old female with 2 day history of left arm pain. No known injury.  EXAM: LEFT FOREARM - 2 VIEW  COMPARISON:  Concurrently obtained radiographs of the wrist  FINDINGS: There is no evidence of fracture or other focal bone lesions. Soft tissues are unremarkable.  IMPRESSION: Negative.   Electronically Signed   By: Jacqulynn Cadet M.D.   On: 06/12/2014 10:30   Dg Wrist Complete Left  06/12/2014   CLINICAL DATA:  63 year old female with 2 day history of left arm  pain without known injury.  EXAM: LEFT WRIST - COMPLETE 3+ VIEW  COMPARISON:  Concurrently obtained radiographs of the right forearm  FINDINGS: No evidence of acute fracture or malalignment. No wrist joint effusion. Scattered atherosclerotic vascular calcifications noted along the course of the radial artery. 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  IMPRESSION: 1. 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 2. Mild atherosclerotic vascular calcifications along the course of the radial artery.   Electronically Signed   By: Jacqulynn Cadet M.D.   On: 06/12/2014 10:29   Ct Head Wo Contrast  06/12/2014   CLINICAL DATA:  63 year old female with left-sided head pain after changing from Coumadin to Xarelto approximately 2 months ago. New onset left arm weakness and  near syncope beginning yesterday  EXAM: CT HEAD WITHOUT CONTRAST  TECHNIQUE: Contiguous axial images were obtained from the base of the skull through the vertex without intravenous contrast.  COMPARISON:  None.  FINDINGS: Negative for acute intracranial hemorrhage, acute infarction, mass, mass effect, hydrocephalus or midline shift. Gray-white differentiation is preserved throughout. Periventricular, subcortical and white matter foci of hypoattenuation which are nonspecific but most suggestive of the sequelae of chronic microvascular ischemic white matter disease. No focal soft tissue or calvarial abnormality. Incompletely imaged right intra parotid lymph node. The at normal aeration of the mastoid air cells and visualized paranasal sinuses.  IMPRESSION: 1. No acute intracranial abnormality. 2. Chronic microvascular ischemic white matter disease.   Electronically Signed   By: Jacqulynn Cadet M.D.   On: 06/12/2014 09:03   Ct Angio Chest Pe W/cm &/or Wo Cm  06/12/2014   CLINICAL DATA:  63 year old female with near syncope. Currently on Xarelto. Past history of DVT and uterine cancer  EXAM: CT ANGIOGRAPHY CHEST WITH CONTRAST   TECHNIQUE: Multidetector CT imaging of the chest was performed using the standard protocol during bolus administration of intravenous contrast. Multiplanar CT image reconstructions and MIPs were obtained to evaluate the vascular anatomy.  CONTRAST:  66mL OMNIPAQUE IOHEXOL 350 MG/ML SOLN  COMPARISON:  Prior CT scan of the chest 03/01/2014  FINDINGS: Mediastinum: Heterogeneously enlarged thyroid gland. No mediastinal mass or suspicious adenopathy. Unremarkable esophagus.  Heart/Vascular: Adequate opacification of the pulmonary arteries to the proximal subsegmental level. No central filling defect to suggest acute pulmonary embolus. Atherosclerotic vascular calcifications in the aorta. No aneurysmal dilatation, dissection or significant stenosis. Main pulmonary artery is within normal limits for size. Atherosclerotic calcifications noted throughout the coronary arteries including the left main, left anterior descending, circumflex and right coronary arteries. The heart remains within normal limits for size. No pericardial effusion.  Lungs/Pleura: No pleural effusion. The lungs are clear. Mild dependent atelectasis. Scattered areas of linear atelectasis versus pleural-parenchymal scarring in the inferior lingula and inferior right middle lobe. Stable 7 x 6 mm subpleural nodule in the medial aspect of the right lower lobe (image 50 series 406) compared to 03/01/2014. There is an adjacent 11 x 6 mm subpleural pulmonary nodule in the posterior aspect of the right lower lobe (image 53) which is slightly more conspicuous than previously seen. This may represent a small focus of round atelectasis versus a true pulmonary nodule. Stable subpleural nodule affiliated with the right major fissure.  Bones/Soft Tissues: No acute fracture or aggressive appearing lytic or blastic osseous lesion. Stable chronic T8 compression fracture with vertebra plana appearance.  Upper Abdomen: Incompletely imaged IVC filter. Adreniform thickening  of the left adrenal gland favored to reflect underlying adrenal hyperplasia. Otherwise, unremarkable.  Review of the MIP images confirms the above findings.  IMPRESSION: 1. Negative for acute pulmonary embolus. 2. Nonspecific subpleural nodular opacities in the posteromedial aspect of the right lower lobe. The smaller opacity is essentially unchanged compared 03/01/2014 while the larger opacity has slightly increased in size. This may represent a region of focal round atelectasis or true pulmonary nodularity. Given the size of the larger nodule at over 1 cm (11 mm) further evaluation with PET-CT should be considered. Alternately, short interval followup with a repeat chest CT in 3 months would be reasonable. 3. Atherosclerosis including multivessel coronary artery disease. 4. Incompletely imaged IVC filter.   Electronically Signed   By: Jacqulynn Cadet M.D.   On: 06/12/2014 12:05     EKG Interpretation  Date/Time:  Saturday June 12 2014 08:00:05 EST Ventricular Rate:  86 PR Interval:  222 QRS Duration: 94 QT Interval:  364 QTC Calculation: 435 R Axis:   -60 Text Interpretation:  Sinus rhythm Prolonged PR interval Inferior infarct,  old Anterior infarct, old anterior and inferior q waves Confirmed by  NANAVATI, MD, ANKIT [id number]) on 06/12/2014 8:05:51 AM      MDM   Final diagnoses:  Arm numbness  Arm pain  Syncope and collapse    10:07 AM Labs show hypokalemia without other acute changes. No other acute labs findings.   12:38 PM Chest xray shows no acute changes. CT angio shows no acute PE. INR is therapeutic. Patient has no neuro deficits. CT head unremarkable and left arm xrays shows no fracture. Patient will be admitted to Internal medicine.    Alvina Chou, PA-C 06/12/14 [place], MD 06/12/14 1652

## 2014-06-12 NOTE — ED Notes (Signed)
Attempted urine sample. Unsuccessful using urinal.

## 2014-06-12 NOTE — ED Notes (Addendum)
Per [facility], pt from home for left sided arm pain, facial pain and left sided weakness since 1600 yesterday. No slurred speech or facial droop. Lungs clear, vss. Pt states she was on her way to the bathroom this morning before EMS arrived and "passed out" but her daughter and grandson caught her and she remembers them assisting her to the floor.

## 2014-06-12 NOTE — ED Notes (Signed)
Pt remains monitored by blood pressure, pulse ox, and 12 lead. pts family remains at bedside.  

## 2014-06-12 NOTE — ED Notes (Signed)
Pt remains monitored by blood pressure, pulse ox, and 12 lead. Pts family remains at bedside.  

## 2014-06-12 NOTE — ED Notes (Signed)
Went to complete orthostatic vital signs pt dry heaving at the time. Was not able to complete orthostatic vital signs at this time.  Reported to RN Phillis Haggis

## 2014-06-12 NOTE — ED Notes (Signed)
Pt attempting to use female urinal. Pt refused bedpan.

## 2014-06-12 NOTE — ED Notes (Signed)
Pt placed into gown and on monitor upon arrival to room. Pt monitored by blood pressure, pulse ox, and 12 lead. pts EKG given to and signed by Dr. Kathrynn Humble

## 2014-06-12 NOTE — ED Notes (Signed)
Attempted to call report x2

## 2014-06-12 NOTE — ED Notes (Signed)
Internal med at bedside.

## 2014-06-12 NOTE — H&P (Signed)
Date: 06/12/2014               Patient Name:  Karen Waters MRN: [id number]  DOB: 09-20-1950 Age / Sex: 63 y.o., female   PCP: Alvester Chou, NP         Medical Service: Internal Medicine Teaching Service         Attending Physician: Dr. Varney Biles, MD    First Contact: Dr. Genene Churn Pager: [phone]  Second Contact: Dr. Gordy Levan Pager: [phone]       After Hours (After 5p/  First Contact Pager: [phone]  weekends / holidays): Second Contact Pager: [phone]   Chief Complaint: left face pain, left arm pain, syncope.   History of Present Illness:   63 yo female hx of HTN, uterine cancer, DVT, chronic lymphaedema, dyspnea on exertion, chronic cough, brought by ambulance from home with left sided arm pain since yesterday with radiation down the arm, facial pain, and left sided weakness. Patient was on her way to the bathroom, was standing up with her walker, and passed out and her daughter/grandson caught her and assisted her to the floor. She had LOC for ~1 minute. No aura like symptoms, palpitations, cp, sob before, during, or after the fall. No hx of seizures and no seizure like movements. She stated she passed out frequently but not sure why.   She also having left arm pain since yesterday, bad enough that she cannot make a fist. Pain starts from her fingers on the left hand ad goes up, stops above the left elbow. No weakness, just painful. Has some pain on her neck on the left side. States that she was put on xarelto few months ago and recently she developed some sinus area pain on the left side of her face and it was thought to be from [place] and states it didn't improve with antibiotics. That's why she was switched to coumadin 1 month ago. She has been on anticoagulation for years (was on coumadin before xarelto), had 3-4 episodes of DVT (no PE). 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 Continues to have left facial pain, sinus tenderness,  and radiation to her head and left neck. No rashes, no hx of shingles, never received the shingle vaccine. Has trouble chewing food because of the pain, but able to swallow fine without pain.  No fever,chills, SOB, chest pain. Had 2x emesis nb/nb in the ED, wasn't having nausea before. She states nausea started after getting dilaudid. No ab pain. No pain anywhere else.  Labs normal except K+ 3.0. INR 2.38. Trop neg.  Xray forearm left - negative for fracture. CT head - neg for any acute intracranial abnormality. Chronic microvascular ischemic white matter disease. Smokes 3/4 pack to 1 pack a day of cigarettes, no ETOH, no other substance usage. Lives with daughter and son in law. Moved in February 2015 from [place]. Has aide who cooks for her and helps her.  Takes lasix for chronic lymphaedema. She says she pees well when she is on lasix, otherwise doesn't pee. She states eating and drinking as usual.   Saw pulm on 03/22/14 with pulm. High res CT on 03/01/14 showed no evidence of ILD. Scattered pulm nodules upto 6 mm in RLL. Next CT planned in 6 months (to be followed over next 2 years). PFT 03/01/14 was difficult to interpret b/c of coughing, wanted to repeat with cough under better control. Was taken off AceI but still  having cough.   Meds: No current facility-administered medications for this encounter.   Current Outpatient Prescriptions  Medication Sig Dispense Refill  . atorvastatin (LIPITOR) 20 MG tablet Take 10 mg by mouth daily at 6 PM. 1/2 tablet qd    . B Complex Vitamins (B-COMPLEX/B-12 PO) Take 1 tablet by mouth daily.    Marland Kitchen COUMADIN 5 MG tablet Take 2.5-5 mg by mouth daily. Tues and Thurs 2.5 mg, all other days 5 mg  Pt takes med around 1600    . DEXILANT 60 MG capsule Take 60 mg by mouth daily.     . fluticasone (FLONASE) 50 MCG/ACT nasal spray Place 2 sprays into both nostrils daily as needed for allergies.     . furosemide (LASIX) 40 MG tablet Take 60 mg by mouth daily. 1.5  tablets once daily    . guaiFENesin-dextromethorphan (ROBITUSSIN DM) 100-10 MG/5ML syrup Take 5 mLs by mouth every 4 (four) hours as needed for cough.    Marland Kitchen ibuprofen (ADVIL,MOTRIN) 200 MG tablet Take 400 mg by mouth every 8 (eight) hours as needed for mild pain or moderate pain.    . montelukast (SINGULAIR) 10 MG tablet Take 10 mg by mouth at bedtime.     . triamterene-hydrochlorothiazide (MAXZIDE) 75-50 MG per tablet Take 1 tablet by mouth daily.      Allergies: Allergies as of 06/12/2014 - Review Complete 06/12/2014  Allergen Reaction Noted  . Darvon [propoxyphene]  03/22/2014  . Percocet [oxycodone-acetaminophen]  03/22/2014  . Valium [diazepam]  03/22/2014   Past Medical History  Diagnosis Date  . Uterine cancer   . SOB (shortness of breath)   . DVT (deep venous thrombosis)   . Lymphedema of both lower extremities    Past Surgical History  Procedure Laterality Date  . Tubal ligation    . Abdominal hysterectomy    . Back surgery    . Cystocele repair     No family history on file. History   Social History  . Marital Status: Divorced    Spouse Name: N/A    Number of Children: N/A  . Years of Education: N/A   Occupational History  . retired    Social History Main Topics  . Smoking status: Current Every Day Smoker -- 1.00 packs/day for 47 years    Types: Cigarettes  . Smokeless tobacco: Never Used  . Alcohol Use: No  . Drug Use: No  . Sexual Activity: Not on file   Other Topics Concern  . Not on file   Social History Narrative    Review of Systems: Review of Systems  Constitutional: Negative for fever, chills, weight loss, malaise/fatigue and diaphoresis.  HENT: Negative for congestion, ear discharge, ear pain, hearing loss, nosebleeds, sore throat and tinnitus.   Eyes: Positive for double vision and pain. Negative for blurred vision, photophobia, discharge and redness.       Has sinus pain and pain around left eye.  Respiratory: Negative for cough,  hemoptysis, sputum production, shortness of breath, wheezing and stridor.   Cardiovascular: Positive for leg swelling. Negative for chest pain, palpitations, orthopnea, claudication and PND.  Gastrointestinal: Positive for nausea and vomiting. Negative for heartburn, abdominal pain, diarrhea, constipation, blood in stool and melena.  Genitourinary: Negative for dysuria, urgency, frequency, hematuria and flank pain.  Musculoskeletal: Negative for myalgias, back pain, joint pain, falls and neck pain.       Has pain on left arm, not able to make a fist because of pain. No weakness. Non  tender to palpation.  Skin: Negative.   Neurological: Positive for dizziness and loss of consciousness. Negative for tingling, tremors, sensory change, speech change, focal weakness, seizures, weakness and headaches.  Endo/Heme/Allergies: Negative.   Psychiatric/Behavioral: Negative.     Physical Exam: Blood pressure 107/45, pulse 71, temperature 98.6 F (37 C), temperature source Oral, resp. rate 14, height 5\' 5"  (1.651 m), weight 200 lb (90.719 kg), SpO2 89 %. Physical Exam  Constitutional: She is oriented to person, place, and time. She appears well-developed and well-nourished. She appears distressed.  Obese woman, appears older than her age. Having some dry heaving, and 1x vomitting when we were interviewing her.  HENT:  Head: Normocephalic and atraumatic.  Right Ear: External ear normal.  Left Ear: External ear normal.  Nose: Nose normal.  Mouth/Throat: Oropharynx is clear and moist. No oropharyngeal exudate.  Has tenderness around frontal and maxillary sinus region on the left. No oropharngeal lesions. No Lymphadenopathies.  Eyes: Conjunctivae and EOM are normal. Pupils are equal, round, and reactive to light. Right eye exhibits no discharge. Left eye exhibits no discharge. No scleral icterus.  Neck: Normal range of motion. Neck supple. No JVD present. No tracheal deviation present. No thyromegaly  present.  No spinous tenderness, no tenderness on neck.  Cardiovascular: Normal rate, regular rhythm, normal heart sounds and intact distal pulses.  Exam reveals no gallop and no friction rub.   2/3 systolic murmur best at apex  Respiratory: Effort normal and breath sounds normal. No stridor. No respiratory distress. She has no wheezes. She has no rales. She exhibits no tenderness.  GI: Soft. Bowel sounds are normal. She exhibits no distension and no mass. There is no tenderness. There is no rebound and no guarding.  Obese. No skin lesions.  Musculoskeletal: Normal range of motion.  Has lymphaedema on both legs, non pitting. Non tender.   Left arm normal, without any swelling or TTP, but cannot make a fist due to pain. Can do flexion and extension of elbow and shoulder on left. Right arm normal.   Neurological: She is alert and oriented to person, place, and time. She has normal reflexes. She displays normal reflexes. No cranial nerve deficit. She exhibits normal muscle tone. Coordination normal.  Skin: Skin is warm. No rash noted. No erythema. No pallor.     Lab results: Basic Metabolic Panel:  Recent Labs  06/12/14 0825 06/12/14 0832  NA 141 140  K 3.2* 3.0*  CL 99 101  CO2 27  --   GLUCOSE 139* 143*  BUN 27* 27*  CREATININE 0.96 1.10  CALCIUM 9.0  --    Liver Function Tests: No results for input(s): AST, ALT, ALKPHOS, BILITOT, PROT, ALBUMIN in the last 72 hours. No results for input(s): LIPASE, AMYLASE in the last 72 hours. No results for input(s): AMMONIA in the last 72 hours. CBC:  Recent Labs  06/12/14 0825 06/12/14 0832  WBC 9.6  --   HGB 12.3 13.6  HCT 37.4 40.0  MCV 86.8  --   PLT 279  --    Cardiac Enzymes: No results for input(s): CKTOTAL, CKMB, CKMBINDEX, TROPONINI in the last 72 hours. BNP: No results for input(s): PROBNP in the last 72 hours. D-Dimer: No results for input(s): DDIMER in the last 72 hours. CBG: No results for input(s): GLUCAP in the  last 72 hours. Hemoglobin A1C: No results for input(s): HGBA1C in the last 72 hours. Fasting Lipid Panel: No results for input(s): CHOL, HDL, LDLCALC, TRIG, CHOLHDL, LDLDIRECT in  the last 72 hours. Thyroid Function Tests: No results for input(s): TSH, T4TOTAL, FREET4, T3FREE, THYROIDAB in the last 72 hours. Anemia Panel: No results for input(s): VITAMINB12, FOLATE, FERRITIN, TIBC, IRON, RETICCTPCT in the last 72 hours. Coagulation:  Recent Labs  06/12/14 0825  LABPROT 26.2*  INR 2.38*   Urine Drug Screen: Drugs of Abuse  No results found for: LABOPIA, COCAINSCRNUR, LABBENZ, AMPHETMU, THCU, LABBARB  Alcohol Level: No results for input(s): ETH in the last 72 hours. Urinalysis: No results for input(s): COLORURINE, LABSPEC, PHURINE, GLUCOSEU, HGBUR, BILIRUBINUR, KETONESUR, PROTEINUR, UROBILINOGEN, NITRITE, LEUKOCYTESUR in the last 72 hours.  Invalid input(s): APPERANCEUR Misc. Labs:  Imaging results:  Dg Forearm Left  06/12/2014   CLINICAL DATA:  63 year old female with 2 day history of left arm pain. No known injury.  EXAM: LEFT FOREARM - 2 VIEW  COMPARISON:  Concurrently obtained radiographs of the wrist  FINDINGS: There is no evidence of fracture or other focal bone lesions. Soft tissues are unremarkable.  IMPRESSION: Negative.   Electronically Signed   By: Jacqulynn Cadet M.D.   On: 06/12/2014 10:30   Dg Wrist Complete Left  06/12/2014   CLINICAL DATA:  63 year old female with 2 day history of left arm pain without known injury.  EXAM: LEFT WRIST - COMPLETE 3+ VIEW  COMPARISON:  Concurrently obtained radiographs of the right forearm  FINDINGS: No evidence of acute fracture or malalignment. No wrist joint effusion. Scattered atherosclerotic vascular calcifications noted along the course of the radial artery. 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  IMPRESSION: 1. 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 2. Mild atherosclerotic vascular calcifications  along the course of the radial artery.   Electronically Signed   By: Jacqulynn Cadet M.D.   On: 06/12/2014 10:29   Ct Head Wo Contrast  06/12/2014   CLINICAL DATA:  63 year old female with left-sided head pain after changing from Coumadin to Xarelto approximately 2 months ago. New onset left arm weakness and near syncope beginning yesterday  EXAM: CT HEAD WITHOUT CONTRAST  TECHNIQUE: Contiguous axial images were obtained from the base of the skull through the vertex without intravenous contrast.  COMPARISON:  None.  FINDINGS: Negative for acute intracranial hemorrhage, acute infarction, mass, mass effect, hydrocephalus or midline shift. Gray-white differentiation is preserved throughout. Periventricular, subcortical and white matter foci of hypoattenuation which are nonspecific but most suggestive of the sequelae of chronic microvascular ischemic white matter disease. No focal soft tissue or calvarial abnormality. Incompletely imaged right intra parotid lymph node. The at normal aeration of the mastoid air cells and visualized paranasal sinuses.  IMPRESSION: 1. No acute intracranial abnormality. 2. Chronic microvascular ischemic white matter disease.   Electronically Signed   By: Jacqulynn Cadet M.D.   On: 06/12/2014 09:03   Ct Angio Chest Pe W/cm &/or Wo Cm  06/12/2014   CLINICAL DATA:  63 year old female with near syncope. Currently on Xarelto. Past history of DVT and uterine cancer  EXAM: CT ANGIOGRAPHY CHEST WITH CONTRAST  TECHNIQUE: Multidetector CT imaging of the chest was performed using the standard protocol during bolus administration of intravenous contrast. Multiplanar CT image reconstructions and MIPs were obtained to evaluate the vascular anatomy.  CONTRAST:  34mL OMNIPAQUE IOHEXOL 350 MG/ML SOLN  COMPARISON:  Prior CT scan of the chest 03/01/2014  FINDINGS: Mediastinum: Heterogeneously enlarged thyroid gland. No mediastinal mass or suspicious adenopathy. Unremarkable esophagus.   Heart/Vascular: Adequate opacification of the pulmonary arteries to the proximal subsegmental level. No central filling defect to  suggest acute pulmonary embolus. Atherosclerotic vascular calcifications in the aorta. No aneurysmal dilatation, dissection or significant stenosis. Main pulmonary artery is within normal limits for size. Atherosclerotic calcifications noted throughout the coronary arteries including the left main, left anterior descending, circumflex and right coronary arteries. The heart remains within normal limits for size. No pericardial effusion.  Lungs/Pleura: No pleural effusion. The lungs are clear. Mild dependent atelectasis. Scattered areas of linear atelectasis versus pleural-parenchymal scarring in the inferior lingula and inferior right middle lobe. Stable 7 x 6 mm subpleural nodule in the medial aspect of the right lower lobe (image 50 series 406) compared to 03/01/2014. There is an adjacent 11 x 6 mm subpleural pulmonary nodule in the posterior aspect of the right lower lobe (image 53) which is slightly more conspicuous than previously seen. This may represent a small focus of round atelectasis versus a true pulmonary nodule. Stable subpleural nodule affiliated with the right major fissure.  Bones/Soft Tissues: No acute fracture or aggressive appearing lytic or blastic osseous lesion. Stable chronic T8 compression fracture with vertebra plana appearance.  Upper Abdomen: Incompletely imaged IVC filter. Adreniform thickening of the left adrenal gland favored to reflect underlying adrenal hyperplasia. Otherwise, unremarkable.  Review of the MIP images confirms the above findings.  IMPRESSION: 1. Negative for acute pulmonary embolus. 2. Nonspecific subpleural nodular opacities in the posteromedial aspect of the right lower lobe. The smaller opacity is essentially unchanged compared 03/01/2014 while the larger opacity has slightly increased in size. This may represent a region of focal round  atelectasis or true pulmonary nodularity. Given the size of the larger nodule at over 1 cm (11 mm) further evaluation with PET-CT should be considered. Alternately, short interval followup with a repeat chest CT in 3 months would be reasonable. 3. Atherosclerosis including multivessel coronary artery disease. 4. Incompletely imaged IVC filter.   Electronically Signed   By: Jacqulynn Cadet M.D.   On: 06/12/2014 12:05    Other results: EKG: sinus rhythm, Q waves on avf? Unclear because of artifacts.   Assessment & Plan by Problem: Active Problems:   Syncope   CAD, multiple vessel  63 yo female with HTN, chronic lymphaedema, chronic cough, CAD here with left facial pain, left arm pain and syncope.  Syncope - unclear etiology CT head negative for any acute changes. EKG no ST changes, will trend trops to rule out MI. CT angio negative for PE. Did show pulmonary nodules. - ordered orthostat (couldn't do because patient was dry heaving) to rule out orthostatic hypotension. Did get 2L fluid in the ED. Will not give her any more fluid. - will get ECHO to rule out cardiac structural abnormality, monitor on tele for any arrythmia - neuro checks.  - hold BP meds (lasix and maxzide for now as could be volume depleted).  Left arm pain - unclear etiology. No weakness or numbness/tingling, just has pain all the sudden starting yesterday.  - left forearm and wrist xray normal. Will get Xray Cspine to rule out any fracture/nerve compression.  - got morphine in the ED and also dilaudid. Dilaudid made her nauseous and had 2x emesis. Will not do dilaudid.   Facial pain - with sinus tenderness - unclear etiology- could be secondary to sinusitis but she has no fever. States she was treated with abx without improvement. Sinus look normal on CT head. May consider doing Ct sinuses.  Possible osteoporesis - has T8 compression fracture and also rib fracture. - will check vitamin D. Will do Dexa  outpatient if not  already done. May need to be on vitamin D/calcium. - will recommend fosamax for her.  AKI ? crt 1.10 (no baseline known). - could be volume depleted. - monitor BMP.  Nausea/vomitting - likely 2/2 to dilaudid 1x in ED. Monitor for improvement.  - zofran for nausea.  Hypokalemia - repleted, could be 2/2 to lasix. Chronic leg swelling - lasix 60 mg daily.  Chronic cough - with nasal drainage, allergy history. On  flonase,  singulair, robitussin DM. Used to get allergy shots regularly but has been doing well so stopped shots.  Saw pulm on 03/22/14 with pulm. High res CT on 03/01/14 showed no evidence of ILD. Scattered pulm nodules upto 6 mm in RLL. Next CT planned in 6 months (to be followed over next 2 years). PFT 03/01/14 was difficult to interpret b/c of coughing, wanted to repeat with cough under better control. Was taken off AceI but still having cough.  - will let her f/up with pulm outpatient to manage her pulm nodule and cough. Dyspnea - Has dyspnea with short walking - will get ECHO to evaluate cardiac function. No CP.  CAD - three vessel calcification seen on CT. Not on ASA b/c on coumadin.  HTN - was on more BP meds before but lost 100lb. maxzide 75-50mg  daily and lasix 60mg  daily at home. - hold any BP meds for now as BP normal and lowering may cause more dizziness.  Hx of DVT - will continue coumadin per pharmacy. INR therapeutic.  HLD - lipitor, continue GERD - dexilant 60mg  daily at home. Will do protonix here  Dispo: Disposition is deferred at this time, awaiting improvement of current medical problems. Anticipated discharge in approximately 1-2 day(s).   The patient does have a current PCP Alvester Chou, NP) and does need an [facility] hospital follow-up appointment after discharge.  The patient does have transportation limitations that hinder transportation to clinic appointments.  Signed: Dellia Nims, MD 06/12/2014, 1:08 PM

## 2014-06-12 NOTE — ED Notes (Signed)
Attempted to call report

## 2014-06-12 NOTE — Progress Notes (Signed)
Family will be bring lymphedema sleeve tomorrow.

## 2014-06-12 NOTE — Progress Notes (Signed)
ANTICOAGULATION CONSULT NOTE - Initial Consult  Pharmacy Consult:  Coumadin Indication:  History of DVT  Allergies  Allergen Reactions  . Darvon [Propoxyphene]     REACTIONS: hallucinations  . Percocet [Oxycodone-Acetaminophen]     REACTION: hallucinations  . Valium [Diazepam]     REACTION: hallucinations    Patient Measurements: Height: 5\' 5"  (165.1 cm) Weight: 200 lb (90.719 kg) IBW/kg (Calculated) : 57  Vital Signs: Temp: 97.6 F (36.4 C) (12/12 1522) Temp Source: Oral (12/12 1522) BP: 124/53 mmHg (12/12 1522) Pulse Rate: 70 (12/12 1522)  Labs:  Recent Labs  06/12/14 0825 06/12/14 0832 06/12/14 1302  HGB 12.3 13.6  --   HCT 37.4 40.0  --   PLT 279  --   --   LABPROT 26.2*  --   --   INR 2.38*  --   --   CREATININE 0.96 1.10  --   TROPONINI  --   --  <0.30    Estimated Creatinine Clearance: 56.7 mL/min (by C-G formula based on Cr of 1.1).   Medical History: Past Medical History  Diagnosis Date  . Uterine cancer   . SOB (shortness of breath)   . DVT (deep venous thrombosis)   . Lymphedema of both lower extremities       Assessment: 37 YOF with history of multiple DVTs to continue on Coumadin.  Patient's INR is therapeutic on admit.  No bleeding reported.   Goal of Therapy:  INR 2-3    Plan:  - Coumadin 5mg  PO today - Daily PT / INR    Quetzali Heinle D. Mina Marble, PharmD, BCPS Pager:  [phone] 06/12/2014, 4:08 PM

## 2014-06-12 NOTE — ED Notes (Signed)
Patient transported to CT 

## 2014-06-12 NOTE — ED Notes (Signed)
Pt remains monitored by blood pressure, pulse ox, and 12 lead. Admitting at bedside.

## 2014-06-12 NOTE — ED Notes (Signed)
Kaitlyn, PA back at the bedside.

## 2014-06-12 NOTE — ED Notes (Signed)
Completed vital signs in all four extremities.   Right Arm : 129/49  Left Arm: 111/58  Left Leg: 121/90  Right Leg: 127/60

## 2014-06-12 NOTE — ED Notes (Addendum)
Family at the bedside.

## 2014-06-13 ENCOUNTER — Encounter (HOSPITAL_COMMUNITY): Payer: Self-pay | Admitting: Internal Medicine

## 2014-06-13 DIAGNOSIS — E119 Type 2 diabetes mellitus without complications: Secondary | ICD-10-CM

## 2014-06-13 DIAGNOSIS — I517 Cardiomegaly: Secondary | ICD-10-CM

## 2014-06-13 DIAGNOSIS — Z7901 Long term (current) use of anticoagulants: Secondary | ICD-10-CM

## 2014-06-13 DIAGNOSIS — N179 Acute kidney failure, unspecified: Secondary | ICD-10-CM

## 2014-06-13 DIAGNOSIS — R51 Headache: Secondary | ICD-10-CM | POA: Diagnosis not present

## 2014-06-13 DIAGNOSIS — R55 Syncope and collapse: Secondary | ICD-10-CM | POA: Diagnosis not present

## 2014-06-13 DIAGNOSIS — E876 Hypokalemia: Secondary | ICD-10-CM

## 2014-06-13 DIAGNOSIS — Z720 Tobacco use: Secondary | ICD-10-CM

## 2014-06-13 DIAGNOSIS — R200 Anesthesia of skin: Secondary | ICD-10-CM | POA: Diagnosis not present

## 2014-06-13 DIAGNOSIS — I890 Lymphedema, not elsewhere classified: Secondary | ICD-10-CM

## 2014-06-13 DIAGNOSIS — R8290 Unspecified abnormal findings in urine: Secondary | ICD-10-CM

## 2014-06-13 DIAGNOSIS — M79602 Pain in left arm: Secondary | ICD-10-CM

## 2014-06-13 DIAGNOSIS — Z86718 Personal history of other venous thrombosis and embolism: Secondary | ICD-10-CM

## 2014-06-13 LAB — TSH: TSH: 1.85 u[IU]/mL (ref 0.350–4.500)

## 2014-06-13 LAB — GLUCOSE, CAPILLARY
GLUCOSE-CAPILLARY: 131 mg/dL — AB (ref 70–99)
GLUCOSE-CAPILLARY: 139 mg/dL — AB (ref 70–99)
Glucose-Capillary: 76 mg/dL (ref 70–99)
Glucose-Capillary: 131 mg/dL — ABNORMAL HIGH (ref 70–99)

## 2014-06-13 LAB — BASIC METABOLIC PANEL
ANION GAP: 12 (ref 5–15)
BUN: 21 mg/dL (ref 6–23)
CO2: 27 mEq/L (ref 19–32)
Calcium: 9.3 mg/dL (ref 8.4–10.5)
Chloride: 101 mEq/L (ref 96–112)
Creatinine, Ser: 0.85 mg/dL (ref 0.50–1.10)
GFR calc non Af Amer: 70 mL/min — ABNORMAL LOW (ref >90)
GFR, EST AFRICAN AMERICAN: 82 mL/min — AB (ref >90)
Glucose, Bld: 84 mg/dL (ref 70–99)
POTASSIUM: 3.2 meq/L — AB (ref 3.7–5.3)
SODIUM: 140 meq/L (ref 137–147)

## 2014-06-13 LAB — URINE MICROSCOPIC-ADD ON

## 2014-06-13 LAB — PROTIME-INR
INR: 2.53 — ABNORMAL HIGH (ref 0.00–1.49)
PROTHROMBIN TIME: 27.5 s — AB (ref 11.6–15.2)

## 2014-06-13 LAB — SEDIMENTATION RATE: Sed Rate: 102 mm/hr — ABNORMAL HIGH (ref 0–22)

## 2014-06-13 LAB — URINALYSIS, ROUTINE W REFLEX MICROSCOPIC
Bilirubin Urine: NEGATIVE
Glucose, UA: NEGATIVE mg/dL
Hgb urine dipstick: NEGATIVE
Ketones, ur: NEGATIVE mg/dL
Leukocytes, UA: NEGATIVE
Nitrite: POSITIVE — AB
Protein, ur: NEGATIVE mg/dL
SPECIFIC GRAVITY, URINE: 1.033 — AB (ref 1.005–1.030)
Urobilinogen, UA: 0.2 mg/dL (ref 0.0–1.0)
pH: 6 (ref 5.0–8.0)

## 2014-06-13 LAB — RAPID URINE DRUG SCREEN, HOSP PERFORMED
Amphetamines: NOT DETECTED
Barbiturates: NOT DETECTED
Benzodiazepines: NOT DETECTED
Cocaine: NOT DETECTED
Opiates: POSITIVE — AB
Tetrahydrocannabinol: NOT DETECTED

## 2014-06-13 LAB — MAGNESIUM: MAGNESIUM: 1.6 mg/dL (ref 1.5–2.5)

## 2014-06-13 LAB — TROPONIN I: Troponin I: <0.3 ng/mL (ref <0.30)

## 2014-06-13 LAB — HEMOGLOBIN A1C
Hgb A1c MFr Bld: 8.4 % — ABNORMAL HIGH (ref <5.7)
Mean Plasma Glucose: 194 mg/dL — ABNORMAL HIGH (ref <117)

## 2014-06-13 LAB — VITAMIN D 25 HYDROXY (VIT D DEFICIENCY, FRACTURES): Vit D, 25-Hydroxy: 11 ng/mL — ABNORMAL LOW (ref 30–100)

## 2014-06-13 MED ORDER — LEVOFLOXACIN IN D5W 500 MG/100ML IV SOLN
500.0000 mg | Freq: Every day | INTRAVENOUS | Status: DC
  Administered 2014-06-13: 500 mg via INTRAVENOUS
  Filled 2014-06-13: qty 100

## 2014-06-13 MED ORDER — VITAMIN D (ERGOCALCIFEROL) 1.25 MG (50000 UNIT) PO CAPS
50000.0000 [IU] | ORAL_CAPSULE | ORAL | Status: DC
  Administered 2014-06-14: 50000 [IU] via ORAL
  Filled 2014-06-13: qty 1

## 2014-06-13 MED ORDER — POTASSIUM CHLORIDE CRYS ER 10 MEQ PO TBCR
EXTENDED_RELEASE_TABLET | ORAL | Status: AC
Start: 2014-06-13 — End: 2014-06-13
  Filled 2014-06-13: qty 4

## 2014-06-13 MED ORDER — POTASSIUM CHLORIDE CRYS ER 20 MEQ PO TBCR
40.0000 meq | EXTENDED_RELEASE_TABLET | Freq: Once | ORAL | Status: AC
  Administered 2014-06-13: 40 meq via ORAL
  Filled 2014-06-13: qty 4

## 2014-06-13 MED ORDER — PREDNISONE 20 MG PO TABS
40.0000 mg | ORAL_TABLET | Freq: Every day | ORAL | Status: DC
  Administered 2014-06-13 – 2014-06-15 (×3): 40 mg via ORAL
  Filled 2014-06-13 (×3): qty 2

## 2014-06-13 MED ORDER — WARFARIN SODIUM 2.5 MG PO TABS
2.5000 mg | ORAL_TABLET | Freq: Once | ORAL | Status: AC
  Administered 2014-06-13: 2.5 mg via ORAL
  Filled 2014-06-13: qty 1

## 2014-06-13 MED ORDER — MAGNESIUM SULFATE 2 GM/50ML IV SOLN
2.0000 g | Freq: Once | INTRAVENOUS | Status: AC
  Administered 2014-06-13: 2 g via INTRAVENOUS
  Filled 2014-06-13: qty 50

## 2014-06-13 MED ORDER — TRAMADOL HCL 50 MG PO TABS
50.0000 mg | ORAL_TABLET | Freq: Four times a day (QID) | ORAL | Status: DC | PRN
  Administered 2014-06-13 – 2014-06-15 (×4): 50 mg via ORAL
  Filled 2014-06-13 (×5): qty 1

## 2014-06-13 NOTE — Progress Notes (Addendum)
Subjective: She is feeling better in terms of her arm pain but her facial pain still there on the forehead area on the left side, around her eye, and also sometimes around her cheeks. Nausea and vomiting resolved.  She states she passed out all the time. She is not concerned about it since she has been passing out since her childhood. She states she had stress test few months ago it was normal per patient. She never saw a cardiologist her LOC. She states she doesn't feel any symptom before passing out, just happens all the sudden without any preceding event. It happens randomly when walking or doing something.  Objective: Vital signs in last 24 hours: Filed Vitals:   06/12/14 2000 06/13/14 0000 06/13/14 0400 06/13/14 0751  BP: 108/66 113/43 108/51 116/50  Pulse: 67 70 77 75  Temp: 98.1 F (36.7 C) 98.2 F (36.8 C) 98.2 F (36.8 C) 98.3 F (36.8 C)  TempSrc: Oral Oral Oral Oral  Resp: _0 Height:      Weight:   213 lb (96.616 kg)   SpO2: 98% 95% 96% 93%   Weight change:   Intake/Output Summary (Last 24 hours) at 06/13/14 1028 Last data filed at 06/13/14 0900  Gross per 24 hour  Intake   1240 ml  Output    600 ml  Net    640 ml   Vitals reviewed. General: sitting in chair, NAD HEENT: PERRL, EOMI, no scleral icterus Cardiac: RRR, no rubs, murmurs or gallops Pulm: clear to auscultation bilaterally, no wheezes, rales, or rhonchi Abd: soft, nontender, nondistended, BS present Ext: warm and well perfused, no pedal edema. Has some tenderness around her wrist on the dorsal side. Is make to make a fist today and pain is better. Full ROM otherwise.  Neuro: alert and oriented X3, cranial nerves II-XII grossly intact, strength and sensation to light touch equal in bilateral upper and lower extremities  Lab Results: Basic Metabolic Panel:  Recent Labs Lab 06/12/14 0825 06/12/14 0832 06/13/14 06/13/14 0051  NA 141 140 140  --   K 3.2* 3.0* 3.2*  --   CL 99 101 101  --    CO2 27  --  27  --   GLUCOSE 139* 143* 84  --   BUN 27* 27* 21  --   CREATININE 0.96 1.10 0.85  --   CALCIUM 9.0  --  9.3  --   MG  --   --   --  1.6   Liver Function Tests: No results for input(s): AST, ALT, ALKPHOS, BILITOT, PROT, ALBUMIN in the last 168 hours. No results for input(s): LIPASE, AMYLASE in the last 168 hours. No results for input(s): AMMONIA in the last 168 hours. CBC:  Recent Labs Lab 06/12/14 0825 06/12/14 0832  WBC 9.6  --   HGB 12.3 13.6  HCT 37.4 40.0  MCV 86.8  --   PLT 279  --    Cardiac Enzymes:  Recent Labs Lab 06/12/14 1302 06/12/14 1906 06/13/14  TROPONINI <0.30 <0.30 <0.30   BNP:  Recent Labs Lab 06/12/14 1906  PROBNP 235.8*   D-Dimer: No results for input(s): DDIMER in the last 168 hours. CBG:  Recent Labs Lab 06/12/14 1335 06/12/14 1643 06/12/14 2021 06/13/14 0727  GLUCAP 121* 158* 126* 76   Hemoglobin A1C:  Recent Labs Lab 06/12/14 1906  HGBA1C 8.4*   Fasting Lipid Panel:  Recent Labs Lab 06/12/14 1906  CHOL 187  HDL 39*  LDLCALC 129*  TRIG 96  CHOLHDL 4.8   Thyroid Function Tests: No results for input(s): TSH, T4TOTAL, FREET4, T3FREE, THYROIDAB in the last 168 hours. Coagulation:  Recent Labs Lab 06/12/14 0825 06/13/14  LABPROT 26.2* 27.5*  INR 2.38* 2.53*   Anemia Panel: No results for input(s): VITAMINB12, FOLATE, FERRITIN, TIBC, IRON, RETICCTPCT in the last 168 hours. Urine Drug Screen: Drugs of Abuse     Component Value Date/Time   LABOPIA POSITIVE* 06/13/2014 0159   COCAINSCRNUR NONE DETECTED 06/13/2014 0159   LABBENZ NONE DETECTED 06/13/2014 0159   AMPHETMU NONE DETECTED 06/13/2014 0159   THCU NONE DETECTED 06/13/2014 0159   LABBARB NONE DETECTED 06/13/2014 0159    Alcohol Level: No results for input(s): ETH in the last 168 hours. Urinalysis:  Recent Labs Lab 06/13/14 0159  COLORURINE YELLOW  LABSPEC 1.033*  PHURINE 6.0  GLUCOSEU NEGATIVE  HGBUR NEGATIVE  BILIRUBINUR  NEGATIVE  KETONESUR NEGATIVE  PROTEINUR NEGATIVE  UROBILINOGEN 0.2  NITRITE POSITIVE*  LEUKOCYTESUR NEGATIVE   Misc. Labs:  Micro Results: No results found for this or any previous visit (from the past 240 hour(s)). Studies/Results: Dg Cervical Spine Complete  06/12/2014   CLINICAL DATA:  Left-sided neck and arm pain for 2 and half months. No known injury. History of compression/ burst. Patient is unsteady with vomiting and shaking. Best images possible.  EXAM: CERVICAL SPINE  4+ VIEWS  COMPARISON:  None.  FINDINGS: There is no evidence of cervical spine fracture or prevertebral soft tissue swelling. Alignment is normal. Bones appear radiolucent. There is atherosclerotic calcification of the aorta and carotid arteries. Lung apices are clear.  IMPRESSION: No evidence for acute  abnormality.   Electronically Signed   By: Shon Hale M.D.   On: 06/12/2014 18:34   Dg Forearm Left  06/12/2014   CLINICAL DATA:  63 year old female with 2 day history of left arm pain. No known injury.  EXAM: LEFT FOREARM - 2 VIEW  COMPARISON:  Concurrently obtained radiographs of the wrist  FINDINGS: There is no evidence of fracture or other focal bone lesions. Soft tissues are unremarkable.  IMPRESSION: Negative.   Electronically Signed   By: Jacqulynn Cadet M.D.   On: 06/12/2014 10:30   Dg Wrist Complete Left  06/12/2014   CLINICAL DATA:  63 year old female with 2 day history of left arm pain without known injury.  EXAM: LEFT WRIST - COMPLETE 3+ VIEW  COMPARISON:  Concurrently obtained radiographs of the right forearm  FINDINGS: No evidence of acute fracture or malalignment. No wrist joint effusion. Scattered atherosclerotic vascular calcifications noted along the course of the radial artery. 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  IMPRESSION: 1. 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 2. Mild atherosclerotic vascular calcifications along the course of the radial artery.    Electronically Signed   By: Jacqulynn Cadet M.D.   On: 06/12/2014 10:29   Ct Head Wo Contrast  06/12/2014   CLINICAL DATA:  63 year old female with left-sided head pain after changing from Coumadin to Xarelto approximately 2 months ago. New onset left arm weakness and near syncope beginning yesterday  EXAM: CT HEAD WITHOUT CONTRAST  TECHNIQUE: Contiguous axial images were obtained from the base of the skull through the vertex without intravenous contrast.  COMPARISON:  None.  FINDINGS: Negative for acute intracranial hemorrhage, acute infarction, mass, mass effect, hydrocephalus or midline shift. Gray-white differentiation is preserved throughout. Periventricular, subcortical and white matter foci of hypoattenuation which are nonspecific but most suggestive of the  sequelae of chronic microvascular ischemic white matter disease. No focal soft tissue or calvarial abnormality. Incompletely imaged right intra parotid lymph node. The at normal aeration of the mastoid air cells and visualized paranasal sinuses.  IMPRESSION: 1. No acute intracranial abnormality. 2. Chronic microvascular ischemic white matter disease.   Electronically Signed   By: Jacqulynn Cadet M.D.   On: 06/12/2014 09:03   Ct Angio Chest Pe W/cm &/or Wo Cm  06/12/2014   CLINICAL DATA:  63 year old female with near syncope. Currently on Xarelto. Past history of DVT and uterine cancer  EXAM: CT ANGIOGRAPHY CHEST WITH CONTRAST  TECHNIQUE: Multidetector CT imaging of the chest was performed using the standard protocol during bolus administration of intravenous contrast. Multiplanar CT image reconstructions and MIPs were obtained to evaluate the vascular anatomy.  CONTRAST:  62m OMNIPAQUE IOHEXOL 350 MG/ML SOLN  COMPARISON:  Prior CT scan of the chest 03/01/2014  FINDINGS: Mediastinum: Heterogeneously enlarged thyroid gland. No mediastinal mass or suspicious adenopathy. Unremarkable esophagus.  Heart/Vascular: Adequate opacification of the  pulmonary arteries to the proximal subsegmental level. No central filling defect to suggest acute pulmonary embolus. Atherosclerotic vascular calcifications in the aorta. No aneurysmal dilatation, dissection or significant stenosis. Main pulmonary artery is within normal limits for size. Atherosclerotic calcifications noted throughout the coronary arteries including the left main, left anterior descending, circumflex and right coronary arteries. The heart remains within normal limits for size. No pericardial effusion.  Lungs/Pleura: No pleural effusion. The lungs are clear. Mild dependent atelectasis. Scattered areas of linear atelectasis versus pleural-parenchymal scarring in the inferior lingula and inferior right middle lobe. Stable 7 x 6 mm subpleural nodule in the medial aspect of the right lower lobe (image 50 series 406) compared to 03/01/2014. There is an adjacent 11 x 6 mm subpleural pulmonary nodule in the posterior aspect of the right lower lobe (image 53) which is slightly more conspicuous than previously seen. This may represent a small focus of round atelectasis versus a true pulmonary nodule. Stable subpleural nodule affiliated with the right major fissure.  Bones/Soft Tissues: No acute fracture or aggressive appearing lytic or blastic osseous lesion. Stable chronic T8 compression fracture with vertebra plana appearance.  Upper Abdomen: Incompletely imaged IVC filter. Adreniform thickening of the left adrenal gland favored to reflect underlying adrenal hyperplasia. Otherwise, unremarkable.  Review of the MIP images confirms the above findings.  IMPRESSION: 1. Negative for acute pulmonary embolus. 2. Nonspecific subpleural nodular opacities in the posteromedial aspect of the right lower lobe. The smaller opacity is essentially unchanged compared 03/01/2014 while the larger opacity has slightly increased in size. This may represent a region of focal round atelectasis or true pulmonary nodularity. Given  the size of the larger nodule at over 1 cm (11 mm) further evaluation with PET-CT should be considered. Alternately, short interval followup with a repeat chest CT in 3 months would be reasonable. 3. Atherosclerosis including multivessel coronary artery disease. 4. Incompletely imaged IVC filter.   Electronically Signed   By: HJacqulynn CadetM.D.   On: 06/12/2014 12:05   Medications: I have reviewed the patient's current medications. Scheduled Meds: . atorvastatin  10 mg Oral q1800  . insulin aspart  0-9 Units Subcutaneous TID WC  . levofloxacin (LEVAQUIN) IV  500 mg Intravenous Daily  . montelukast  10 mg Oral QHS  . multivitamin with minerals  1 tablet Oral Daily  . pantoprazole  40 mg Oral Daily  . potassium chloride  40 mEq Oral Once  . sodium chloride  1 spray Each Nare QID  . warfarin  2.5 mg Oral ONCE-1800  . Warfarin - Pharmacist Dosing Inpatient   Does not apply q1800   Continuous Infusions:  PRN Meds:.fluticasone, guaiFENesin-dextromethorphan, ondansetron (ZOFRAN) IV Assessment/Plan: Active Problems:   Syncope   CAD, multiple vessel   63 yo female with HTN, DVT, chronic lymphaedema, here with left facial pain, left arm pain, and with syncope.  Syncope - unclear etiology. 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 Had stress test few months ago which was normal per patient but no other workup was done according to her. She denies feeling dizzy, SOB, or palpitations. - could be orthostatic hypotension given she usually is standing up when this happens and she had her LOC yesterday when she got up and was walking to the bathroom - will check orthostats (asking RN to check it as it was still not done since yesterday). Did get IVF so it could be normal now. She could have been volume depleted as her crt improved with IVF. - will need outpatient cardiology referral. Possibly needs long term cardiac monitor to rule out  arrythmias causing her recurrent LOC.  - hold BP meds for now since BP low.  - ECHO normal (normal EF 55-60%, no AS). Consulted EP Dr. Joylene Grapes for recs. May need loop recorder.  Left facial pain - has pain on her forehead area constantly for few weeks. No rash. No hx of shingles, she did not get shingle vaccine. This could be trigeminal neuralgia, but since she doesn't have clear evidence of shingles, may need to do MRI to rule out any vascular compression of trigeminal nerve, MS plaques, or space occupying lesion. Will also check ESR to rule out temporal arteritis and do MRI after that if normal. Pain is worse when tries to chew food. - tramadol for pain for now. May need Neurontin or amitriptyline for trigeminal neuralgia unless having Temporal Arteritis.   Left arm pain - likely from OA - Xray forearm/wrist showed osteoarthritis, Xray Cspine negative for fracture.   UA positive for nitrite but she has no symptoms of UTI (no dysuria, frequency, mal odor as we confirmed today). Was started on Levaquin overnight. Will discontinue today.  AKI - crt was 1.10, likely pre-renal, now 0.85 with IVF  DM II - hgba1c 8.5 here. Was taking 2 oral meds including glipizide and something else she doesn't remember. - will continue those on discharge. SSI here. cbg's under goal.  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 Continue coumadin, INR therapeutic.  Hypokalemia - 2/2 to lasix likely. Will hold lasix. Was taking lasix for lymphadema, which doesn't help with it since the cause if lymphatic damage, not volume overload.   CAD - three vessel calcification on CT, but not ON ASA b/c of coumadin. Had normal stress test per patient few months ago. - may benefit from beta blocker.  HTN - was on lasix 41m daily and maxide 75-537mdaily. Normotensive here. Will not give her lasix because that could lead to further volume depletion and lead to more syncope  Chronic lymphadema - 2/2 to lymph node exicision for her uterine  cancer per patient. was taking lasix for it. Will not give her that since she is not volume overloaded on exam, just her lymphadedema which is not treated best with lasix. Best thing is to use her lymphadema sleeves and keep leg elevated.  Dispo: Disposition is deferred at this time, awaiting improvement  of current medical problems.  Anticipated discharge in approximately 1-2 day(s).   The patient does have a current PCP Alvester Chou, NP) and does need an [facility] hospital follow-up appointment after discharge.  The patient does have transportation limitations that hinder transportation to clinic appointments.  .Services Needed at time of discharge: Y = Yes, Blank = No PT:   OT:   RN:   Equipment:   Other:     LOS: 1 day   Dellia Nims, MD 06/13/2014, 10:28 AM

## 2014-06-13 NOTE — Progress Notes (Signed)
Utilization Review Completed.   Janetta Vandoren, RN, BSN Nurse Case Manager  

## 2014-06-13 NOTE — Consult Note (Signed)
CARDIOLOGY CONSULT NOTE     Primary Care Physician: Alvester Chou, NP Referring Physician:  Dr Ward Givens Date: 06/12/2014  Reason for consultation: syncope  Karen Waters is a 63 y.o. female with a h/o obesity, lymphedema, and recurrent postural syncope.  She is admitted following a syncopal event.  She states that she stood up and felt weak, then collapsed.  Her family was able to help her to the ground and she did not sustain injury.  Upon evaluation at [facility], she had postural dizziness and formal orthostatics were not performed.  She was also noted to have significant pre-renal azotemia on labs.  She has received IVF and now feels "much better".  She had a similar episode of postural syncope 8 months ago and also back in 2012.  She denies any tachypalpitations.  She has not recently had syncope at rest or when supine.  She reports having syncope as a young child but is unable to recall events related to this.  She denies FH of sudden death or arrhythmia.  She has chronic lymphedema for which she has taken lasix "for years". Today, she denies symptoms of palpitations, chest pain, shortness of breath, orthopnea, PND, or neurologic sequela. The patient is tolerating medications without difficulties and is otherwise without complaint today.   Past Medical History  Diagnosis Date  . Uterine cancer   . DVT (deep venous thrombosis)   . Lymphedema of both lower extremities   . Obesity   . Tobacco abuse    Past Surgical History  Procedure Laterality Date  . Tubal ligation    . Abdominal hysterectomy    . Back surgery    . Cystocele repair      . atorvastatin  10 mg Oral q1800  . insulin aspart  0-9 Units Subcutaneous TID WC  . magnesium sulfate 1 - 4 g bolus IVPB  2 g Intravenous Once  . montelukast  10 mg Oral QHS  . multivitamin with minerals  1 tablet Oral Daily  . pantoprazole  40 mg Oral Daily  . predniSONE  40 mg Oral Q breakfast  . sodium chloride  1 spray Each Nare QID    . warfarin  2.5 mg Oral ONCE-1800  . Warfarin - Pharmacist Dosing Inpatient   Does not apply q1800      Allergies  Allergen Reactions  . Darvon [Propoxyphene]     REACTIONS: hallucinations  . Percocet [Oxycodone-Acetaminophen]     REACTION: hallucinations  . Valium [Diazepam]     REACTION: hallucinations    History   Social History  . Marital Status: Divorced    Spouse Name: N/A    Number of Children: N/A  . Years of Education: N/A   Occupational History  . retired    Social History Main Topics  . Smoking status: Current Every Day Smoker -- 1.00 packs/day for 47 years    Types: Cigarettes  . Smokeless tobacco: Never Used  . Alcohol Use: No  . Drug Use: No  . Sexual Activity: Not on file   Other Topics Concern  . Not on file   Social History Narrative    Family History  Problem Relation Age of Onset  . Hypertension      ROS- All systems are reviewed and negative except as per the HPI above  Physical Exam: Telemetry: Filed Vitals:   06/12/14 2000 06/13/14 0000 06/13/14 0400 06/13/14 0751  BP: 108/66 113/43 108/51 116/50  Pulse: 67 70 77 75  Temp: 98.1  F (36.7 C) 98.2 F (36.8 C) 98.2 F (36.8 C) 98.3 F (36.8 C)  TempSrc: Oral Oral Oral Oral  Resp: 18 18 20 18   Height:      Weight:   213 lb (96.616 kg)   SpO2: 98% 95% 96% 93%    GEN- The patient is obese appearing, alert and oriented x 3 today.   Head- normocephalic, atraumatic Eyes-  Sclera clear, conjunctiva pink Ears- hearing intact Oropharynx- clear Neck- supple,  Lungs- Clear to ausculation bilaterally, normal work of breathing Heart- Regular rate and rhythm, no murmurs, rubs or gallops, PMI not laterally displaced GI- soft, NT, ND, + BS Extremities- no clubbing, cyanosis, + marked lymphedema MS- no significant deformity or atrophy Skin- no rash or lesion Psych- euthymic mood, full affect Neuro- strength and sensation are intact  EKG reveals sinus rhythm with first degree AV block,  inferior infarct pattern  Echo reveals EF 55-60% with no WMA.  Labs:   Lab Results  Component Value Date   WBC 9.6 06/12/2014   HGB 13.6 06/12/2014   HCT 40.0 06/12/2014   MCV 86.8 06/12/2014   PLT 279 06/12/2014    Recent Labs Lab 06/13/14  NA 140  K 3.2*  CL 101  CO2 27  BUN 21  CREATININE 0.85  CALCIUM 9.3  GLUCOSE 84   Lab Results  Component Value Date   TROPONINI <0.30 06/13/2014    Lab Results  Component Value Date   CHOL 187 06/12/2014   Lab Results  Component Value Date   HDL 39* 06/12/2014   Lab Results  Component Value Date   LDLCALC 129* 06/12/2014   Lab Results  Component Value Date   TRIG 96 06/12/2014   Lab Results  Component Value Date   CHOLHDL 4.8 06/12/2014   No results found for: LDLDIRECT    ASSESSMENT AND PLAN:   1. Postural dizziness and syncope The patient presents with postural dizziness/ syncope in the setting of chronic diuretic use.  She did not have orthostatics obtained when she first got her but per report did have reproducible postural dizziness.  She has marked prerenal azotemia on arrival.  I agree with primary team that lasix use was probably the culprit and that she was likely intravascularly deplete despite her chronic edema. I have advised that she stop lasix, avoid salt, and wear support hose.  I have also recommended that she be seen electively as an outpatient by a vein specialist.  Lifestyle modification including maintaining hydration and avoiding quick standing was encouraged.  Isometric exercises prior to standing was also encouraged.   I do not feel that an arrhythmic cause is likely.  I would not recommend any additional heart monitoring or CV workup at this time.  In the setting of syncope, driving restrictions were discussed today.  2. Tobacco Cessation advised  3. Obesity Body mass index is 35.44 kg/(m^2). Weight reduction would like help with her lymphedema management   Cardiology team to see as needed  while here. Please call with questions.   Thompson Grayer, MD 06/13/2014  4:05 PM

## 2014-06-13 NOTE — Progress Notes (Signed)
  Echocardiogram 2D Echocardiogram has been performed.  Bobbye Charleston 06/13/2014, 1:52 PM

## 2014-06-13 NOTE — Evaluation (Signed)
Physical Therapy Evaluation Patient Details Name: Karen Waters MRN: [id number] DOB: 04/25/1951 Today's Date: 06/13/2014   History of Present Illness    63 yo female hx of HTN, uterine cancer, DVT, chronic lymphaedema, dyspnea on exertion, chronic cough, brought by ambulance from home with left sided arm pain since yesterday with radiation down the arm, facial pain, and left sided weakness. Patient was on her way to the bathroom, was standing up with her walker, and passed out and her daughter/grandson caught her and assisted her to the floor. She had LOC for ~1 minute. She stated she passes out frequently but not sure why.    Clinical Impression  Pt currently with functional limitations due to pain, decreased mobility, decreased strength, decreased balance. Pt will benefit from skilled PT to increase independence and safety with mobility to allow discharge to home with supervision. PT discussed benefits of HHPT with patient and and pt stated she has had HHPT before but it was not helpful and quickly discontinued due to pain in her knees. Pt functioning near base level but is unsteady with ambulation, exhibiting a significant Right lean and decrease stance time on LLE. Pt will benefit from LE strengthening and balance training while being followed acutely.      Follow Up Recommendations Home health PT    Equipment Recommendations  None recommended by PT    Recommendations for Other Services OT consult     Precautions / Restrictions Precautions Precautions: Fall Restrictions Weight Bearing Restrictions: No      Mobility  Bed Mobility Overal bed mobility: Needs Assistance Bed Mobility: Supine to Sit     Supine to sit: Supervision;HOB elevated     General bed mobility comments: Pt able to sit up using railings to pull trunk up. Pt required verbal cuing for sequencing. Pt stated she sleeps in recliner so does not normally need to perform bed mobility.   Transfers Overall  transfer level: Needs assistance Equipment used: Rolling walker (2 wheeled) Transfers: Sit to/from Stand Sit to Stand: Mod assist         General transfer comment: Pt required mod assist boost to stand from lowered bed. Pt reuired verbal cuing for hand placement during transfer.   Ambulation/Gait Ambulation/Gait assistance: Min assist Ambulation Distance (Feet): 25 Feet Assistive device: Rolling walker (2 wheeled) Gait Pattern/deviations: Decreased stance time - left;Step-through pattern;Decreased stride length   Gait velocity interpretation: Below normal speed for age/gender General Gait Details: Pt with decreased stance time on Left LE with significant Right trunk lean causing unsteadiness. Pt had no loss of balance and reported that this was her baseline.   Stairs            Wheelchair Mobility    Modified Rankin (Stroke Patients Only)       Balance Overall balance assessment: Needs assistance         Standing balance support: During functional activity;Bilateral upper extremity supported Standing balance-Leahy Scale: Poor Standing balance comment: Pt required use of RW to maintain balance.                              Pertinent Vitals/Pain Pain Assessment: 0-10 Pain Score: 8  Pain Location: Left side of face Pain Descriptors / Indicators: Constant;Pressure Pain Intervention(s): Monitored during session;Limited activity within patient's tolerance     Home Living Family/patient expects to be discharged to:: Private residence Living Arrangements: Children (Adult children) Available Help at Discharge: Family;Available PRN/intermittently Type of  Home: House Home Access: Stairs to enter Entrance Stairs-Rails: None Entrance Stairs-Number of Steps: 2 Home Layout: One level Home Equipment: Walker - 2 wheels;Wheelchair - manual;Grab bars - toilet;Shower seat Additional Comments: Pt lives with daughter and son-in-law. Has an aide M-F 8-2pm.      Prior Function Level of Independence: Needs assistance         Comments: Requires aide assistance to get in and out of tub. Sleeps in recliner and has lift chair to stand.      Hand Dominance        Extremity/Trunk Assessment   Upper Extremity Assessment: Defer to OT evaluation           Lower Extremity Assessment: Overall WFL for tasks assessed         Communication   Communication: No difficulties  Cognition Arousal/Alertness: Awake/alert Behavior During Therapy: WFL for tasks assessed/performed Overall Cognitive Status: Within Functional Limits for tasks assessed                      General Comments General comments (skin integrity, edema, etc.): Pt with swelling in Left hand, limiting mobility.     Exercises        Assessment/Plan    PT Assessment Patient needs continued PT services  PT Diagnosis Difficulty walking   PT Problem List Decreased strength;Decreased range of motion;Decreased activity tolerance;Decreased balance;Decreased mobility;Decreased coordination;Pain  PT Treatment Interventions DME instruction;Gait training;Stair training;Functional mobility training;Therapeutic activities;Balance training;Therapeutic exercise;Patient/family education   PT Goals (Current goals can be found in the Care Plan section) Acute Rehab PT Goals Patient Stated Goal: None stated PT Goal Formulation: With patient Time For Goal Achievement: 06/27/14 Potential to Achieve Goals: Fair    Frequency Min 2X/week   Barriers to discharge        Co-evaluation               End of Session Equipment Utilized During Treatment: Gait belt Activity Tolerance: Patient tolerated treatment well Patient left: in chair;with call bell/phone within reach      Functional Assessment Tool Used: clinical judgment Functional Limitation: Mobility: Walking and moving around Mobility: Walking and Moving Around Current Status [phone]): At least 20 percent but less  than 40 percent impaired, limited or restricted Mobility: Walking and Moving Around Goal Status [phone]): At least 1 percent but less than 20 percent impaired, limited or restricted    Time: 0832-0856 PT Time Calculation (min) (ACUTE ONLY): 24 min   Charges:   PT Evaluation $Initial PT Evaluation Tier I: 1 Procedure PT Treatments $Gait Training: 8-22 mins   PT G Codes:   Functional Assessment Tool Used: clinical judgment Functional Limitation: Mobility: Walking and moving around    Goodrich, Christy Gentles 06/13/2014, 9:50 AM  Jearld Shines, SPT  Acute Rehabilitation [phone]

## 2014-06-13 NOTE — Progress Notes (Signed)
ANTICOAGULATION CONSULT NOTE - Follow Up Consult  Pharmacy Consult for warfarin Indication: hx DVT  Allergies  Allergen Reactions  . Darvon [Propoxyphene]     REACTIONS: hallucinations  . Percocet [Oxycodone-Acetaminophen]     REACTION: hallucinations  . Valium [Diazepam]     REACTION: hallucinations    Patient Measurements: Height: 5\' 5"  (165.1 cm) Weight: 213 lb (96.616 kg) IBW/kg (Calculated) : 57  Vital Signs: Temp: 98.3 F (36.8 C) (12/13 0751) Temp Source: Oral (12/13 0751) BP: 116/50 mmHg (12/13 0751) Pulse Rate: 75 (12/13 0751)  Labs:  Recent Labs  06/12/14 0825 06/12/14 0832 06/12/14 1302 06/12/14 1906 06/13/14  HGB 12.3 13.6  --   --   --   HCT 37.4 40.0  --   --   --   PLT 279  --   --   --   --   LABPROT 26.2*  --   --   --  27.5*  INR 2.38*  --   --   --  2.53*  CREATININE 0.96 1.10  --   --  0.85  TROPONINI  --   --  <0.30 <0.30 <0.30    Estimated Creatinine Clearance: 75.8 mL/min (by C-G formula based on Cr of 0.85).   Assessment: 63 y/o female on chronic warfarin for hx of DVTs admitted 12/12 with L arm/facial pain and weakness. INR is therapeutic at 2.53 however started Levaquin yesterday which can increase sensitivity to warfarin.  PTA: 2.5 mg daily except 5 mg T/Th, last dose at home was 12/11  Goal of Therapy:  INR 2-3 Monitor platelets by anticoagulation protocol: Yes   Plan:  - Warfarin 2.5 mg PO tonight - INR daily - Monitor for s/sx of bleeding  [facility], [place].D., BCPS Clinical Pharmacist Pager: [phone] 06/13/2014 8:47 AM

## 2014-06-14 ENCOUNTER — Observation Stay (HOSPITAL_COMMUNITY): Payer: Medicare Other

## 2014-06-14 DIAGNOSIS — R55 Syncope and collapse: Principal | ICD-10-CM

## 2014-06-14 LAB — CBC
HEMATOCRIT: 38.6 % (ref 36.0–46.0)
HEMOGLOBIN: 12.4 g/dL (ref 12.0–15.0)
MCH: 28.2 pg (ref 26.0–34.0)
MCHC: 32.1 g/dL (ref 30.0–36.0)
MCV: 87.9 fL (ref 78.0–100.0)
Platelets: 298 10*3/uL (ref 150–400)
RBC: 4.39 MIL/uL (ref 3.87–5.11)
RDW: 14.1 % (ref 11.5–15.5)
WBC: 5.5 10*3/uL (ref 4.0–10.5)

## 2014-06-14 LAB — GLUCOSE, CAPILLARY
GLUCOSE-CAPILLARY: 217 mg/dL — AB (ref 70–99)
Glucose-Capillary: 193 mg/dL — ABNORMAL HIGH (ref 70–99)
Glucose-Capillary: 271 mg/dL — ABNORMAL HIGH (ref 70–99)
Glucose-Capillary: 204 mg/dL — ABNORMAL HIGH (ref 70–99)

## 2014-06-14 LAB — BASIC METABOLIC PANEL
Anion gap: 12 (ref 5–15)
BUN: 17 mg/dL (ref 6–23)
CALCIUM: 9.6 mg/dL (ref 8.4–10.5)
CO2: 25 meq/L (ref 19–32)
CREATININE: 0.8 mg/dL (ref 0.50–1.10)
Chloride: 97 mEq/L (ref 96–112)
GFR calc Af Amer: 88 mL/min — ABNORMAL LOW (ref >90)
GFR calc non Af Amer: 76 mL/min — ABNORMAL LOW (ref >90)
Glucose, Bld: 229 mg/dL — ABNORMAL HIGH (ref 70–99)
Potassium: 4.6 mEq/L (ref 3.7–5.3)
Sodium: 134 mEq/L — ABNORMAL LOW (ref 137–147)

## 2014-06-14 LAB — MAGNESIUM: Magnesium: 2 mg/dL (ref 1.5–2.5)

## 2014-06-14 LAB — PROTIME-INR
INR: 2.67 — ABNORMAL HIGH (ref 0.00–1.49)
Prothrombin Time: 28.6 seconds — ABNORMAL HIGH (ref 11.6–15.2)

## 2014-06-14 MED ORDER — WARFARIN SODIUM 5 MG PO TABS
5.0000 mg | ORAL_TABLET | ORAL | Status: AC
  Administered 2014-06-14: 5 mg via ORAL
  Filled 2014-06-14: qty 1

## 2014-06-14 MED ORDER — WARFARIN SODIUM 2.5 MG PO TABS: 2.5000 mg | ORAL_TABLET | ORAL | Status: DC

## 2014-06-14 MED ORDER — LORAZEPAM 2 MG/ML IJ SOLN
0.5000 mg | Freq: Once | INTRAMUSCULAR | Status: AC
Start: 2014-06-14 — End: 2014-06-15
  Administered 2014-06-15: 0.5 mg via INTRAVENOUS
  Filled 2014-06-14: qty 1

## 2014-06-14 MED ORDER — INSULIN ASPART 100 UNIT/ML ~~LOC~~ SOLN
0.0000 [IU] | Freq: Three times a day (TID) | SUBCUTANEOUS | Status: DC
  Administered 2014-06-14: 8 [IU] via SUBCUTANEOUS
  Administered 2014-06-14 – 2014-06-15 (×2): 5 [IU] via SUBCUTANEOUS
  Administered 2014-06-15: 3 [IU] via SUBCUTANEOUS

## 2014-06-14 NOTE — Progress Notes (Signed)
Subjective:   Day of hospitalization: 2  VSS.  No overnight events.  Pt still c/o left sided facial pain and states that tramadol helps to "ease the pain."  ESR was high at 102.      Objective:   Vital signs in last 24 hours: Filed Vitals:   06/13/14 1637 06/13/14 2000 06/14/14 0500 06/14/14 0740  BP: 118/56 124/54 110/52 149/85  Pulse: 71 76 95 62  Temp: 98.3 F (36.8 C) 98.4 F (36.9 C) 98.2 F (36.8 C) 97.8 F (36.6 C)  TempSrc: Oral   Oral  Resp: 18 21 20 17   Height:      Weight:   214 lb (97.07 kg)   SpO2: 94% 96% 94% 96%    Weight: Filed Weights   06/12/14 0758 06/13/14 0400 06/14/14 0500  Weight: 200 lb (90.719 kg) 213 lb (96.616 kg) 214 lb (97.07 kg)    I/Os:  Intake/Output Summary (Last 24 hours) at 06/14/14 1118 Last data filed at 06/14/14 0900  Gross per 24 hour  Intake    240 ml  Output      0 ml  Net    240 ml    Physical Exam: Constitutional: Vital signs reviewed.  Patient is sitting up in bed in no acute distress and cooperative with exam.   HEENT: [place]/AT; PERRL, EOMI, conjunctivae normal, no scleral icterus  Cardiovascular: RRR, no MRG Pulmonary/Chest: normal respiratory effort, no accessory muscle use, CTAB, no wheezes, rales, or rhonchi Abdominal: Soft. +BS, NT/ND Neurological: A&O x3, CN II-XII grossly intact; non-focal exam Extremities: 2+DP b/l, no C/C, non-pitting edema b/l of the LE  Skin: Warm, dry and intact.  Lab Results:  BMP:  Recent Labs Lab 06/13/14 06/13/14 0051 06/14/14 0345  NA 140  --  134*  K 3.2*  --  4.6  CL 101  --  97  CO2 27  --  25  GLUCOSE 84  --  229*  BUN 21  --  17  CREATININE 0.85  --  0.80  CALCIUM 9.3  --  9.6  MG  --  1.6 2.0   CBC:  Recent Labs Lab 06/12/14 0825 06/12/14 0832 06/14/14 0345  WBC 9.6  --  5.5  HGB 12.3 13.6 12.4  HCT 37.4 40.0 38.6  MCV 86.8  --  87.9  PLT 279  --  298    Coagulation:  Recent Labs Lab 06/12/14 0825 06/13/14 06/14/14 0345  LABPROT 26.2* 27.5*  28.6*  INR 2.38* 2.53* 2.67*    CBG:            Recent Labs Lab 06/12/14 2021 06/13/14 0727 06/13/14 1147 06/13/14 1636 06/13/14 2102 06/14/14 0741  GLUCAP 126* 76 131* 139* 131* 193*           HA1C:       Recent Labs Lab 06/12/14 1906  HGBA1C 8.4*    Lipid Panel:  Recent Labs Lab 06/12/14 1906  CHOL 187  HDL 39*  LDLCALC 129*  TRIG 96  CHOLHDL 4.8   Cardiac Enzymes:  Recent Labs Lab 06/12/14 1302 06/12/14 1906 06/13/14  TROPONINI <0.30 <0.30 <0.30    EKG: EKG Interpretation  Date/Time:  Saturday June 12 2014 08:00:05 EST Ventricular Rate:  86 PR Interval:  222 QRS Duration: 94 QT Interval:  364 QTC Calculation: 435 R Axis:   -60 Text Interpretation:  Sinus rhythm Prolonged PR interval Inferior infarct, old Anterior infarct, old anterior and inferior q waves Confirmed by Kathrynn Humble, MD, ANKIT (  [id number]) on 06/12/2014 8:05:51 AM   BNP:  Recent Labs Lab 06/12/14 1906  PROBNP 235.8*    D-Dimer: No results for input(s): DDIMER in the last 168 hours.  Urinalysis:  Recent Labs Lab 06/13/14 0159  COLORURINE YELLOW  LABSPEC 1.033*  PHURINE 6.0  GLUCOSEU NEGATIVE  HGBUR NEGATIVE  BILIRUBINUR NEGATIVE  KETONESUR NEGATIVE  PROTEINUR NEGATIVE  UROBILINOGEN 0.2  NITRITE POSITIVE*  LEUKOCYTESUR NEGATIVE    Micro Results: No results found for this or any previous visit (from the past 240 hour(s)).  Blood Culture: No results found for: SDES, SPECREQUEST, CULT, REPTSTATUS  Studies/Results: Dg Cervical Spine Complete  06/12/2014   CLINICAL DATA:  Left-sided neck and arm pain for 2 and half months. No known injury. History of compression/ burst. Patient is unsteady with vomiting and shaking. Best images possible.  EXAM: CERVICAL SPINE  4+ VIEWS  COMPARISON:  None.  FINDINGS: There is no evidence of cervical spine fracture or prevertebral soft tissue swelling. Alignment is normal. Bones appear radiolucent. There is atherosclerotic  calcification of the aorta and carotid arteries. Lung apices are clear.  IMPRESSION: No evidence for acute  abnormality.   Electronically Signed   By: Shon Hale M.D.   On: 06/12/2014 18:34   Ct Angio Chest Pe W/cm &/or Wo Cm  06/12/2014   CLINICAL DATA:  63 year old female with near syncope. Currently on Xarelto. Past history of DVT and uterine cancer  EXAM: CT ANGIOGRAPHY CHEST WITH CONTRAST  TECHNIQUE: Multidetector CT imaging of the chest was performed using the standard protocol during bolus administration of intravenous contrast. Multiplanar CT image reconstructions and MIPs were obtained to evaluate the vascular anatomy.  CONTRAST:  33mL OMNIPAQUE IOHEXOL 350 MG/ML SOLN  COMPARISON:  Prior CT scan of the chest 03/01/2014  FINDINGS: Mediastinum: Heterogeneously enlarged thyroid gland. No mediastinal mass or suspicious adenopathy. Unremarkable esophagus.  Heart/Vascular: Adequate opacification of the pulmonary arteries to the proximal subsegmental level. No central filling defect to suggest acute pulmonary embolus. Atherosclerotic vascular calcifications in the aorta. No aneurysmal dilatation, dissection or significant stenosis. Main pulmonary artery is within normal limits for size. Atherosclerotic calcifications noted throughout the coronary arteries including the left main, left anterior descending, circumflex and right coronary arteries. The heart remains within normal limits for size. No pericardial effusion.  Lungs/Pleura: No pleural effusion. The lungs are clear. Mild dependent atelectasis. Scattered areas of linear atelectasis versus pleural-parenchymal scarring in the inferior lingula and inferior right middle lobe. Stable 7 x 6 mm subpleural nodule in the medial aspect of the right lower lobe (image 50 series 406) compared to 03/01/2014. There is an adjacent 11 x 6 mm subpleural pulmonary nodule in the posterior aspect of the right lower lobe (image 53) which is slightly more conspicuous than  previously seen. This may represent a small focus of round atelectasis versus a true pulmonary nodule. Stable subpleural nodule affiliated with the right major fissure.  Bones/Soft Tissues: No acute fracture or aggressive appearing lytic or blastic osseous lesion. Stable chronic T8 compression fracture with vertebra plana appearance.  Upper Abdomen: Incompletely imaged IVC filter. Adreniform thickening of the left adrenal gland favored to reflect underlying adrenal hyperplasia. Otherwise, unremarkable.  Review of the MIP images confirms the above findings.  IMPRESSION: 1. Negative for acute pulmonary embolus. 2. Nonspecific subpleural nodular opacities in the posteromedial aspect of the right lower lobe. The smaller opacity is essentially unchanged compared 03/01/2014 while the larger opacity has slightly increased in size. This may represent a region of focal  round atelectasis or true pulmonary nodularity. Given the size of the larger nodule at over 1 cm (11 mm) further evaluation with PET-CT should be considered. Alternately, short interval followup with a repeat chest CT in 3 months would be reasonable. 3. Atherosclerosis including multivessel coronary artery disease. 4. Incompletely imaged IVC filter.   Electronically Signed   By: Jacqulynn Cadet M.D.   On: 06/12/2014 12:05    Medications:  Scheduled Meds: . atorvastatin  10 mg Oral q1800  . insulin aspart  0-9 Units Subcutaneous TID WC  . montelukast  10 mg Oral QHS  . multivitamin with minerals  1 tablet Oral Daily  . pantoprazole  40 mg Oral Daily  . predniSONE  40 mg Oral Q breakfast  . sodium chloride  1 spray Each Nare QID  . Vitamin D (Ergocalciferol)  50,000 Units Oral Q7 days  . warfarin  5 mg Oral Once per day on Sun Mon Wed Fri Sat   And  . [START ON 06/15/2014] warfarin  2.5 mg Oral Once per day on Tue Thu  . Warfarin - Pharmacist Dosing Inpatient   Does not apply q1800   Continuous Infusions:  PRN Meds: fluticasone,  guaiFENesin-dextromethorphan, ondansetron (ZOFRAN) IV, traMADol  Antibiotics: Antibiotics Given (last 72 hours)    Date/Time Action Medication Dose Rate   06/13/14 0454 Given   levofloxacin (LEVAQUIN) IVPB 500 mg 500 mg 100 mL/hr      Day of Hospitalization: 2  Consults: Treatment Team:  Catarina Hartshorn, MD  Assessment/Plan:   Active Problems:   Syncope   CAD, multiple vessel   Syncope Likely d/t volume depletion.  Nuclear stress test a few months ago was normal per patient but no other workup was done according to her.  Have requested records.  EKG no arrhythmia.  TTE yesterday shows (normal EF 55-60%, no AS). Orthostatics were not checked upon admission and pt not orthostatic now after IVF.  Consulted EP Dr. Rayann Heman for recs and does not need any further w/u also thinks likely d/t volume depletion from diuretics being taken for lymphedema.  She was advised to stop these.  We are still concerned that she endorses having syncopal episodes since childhood and will ask neuro to evaluate for possible seizures? 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 No rash. No h/o shingles, she did not get shingle vaccine.  Also endorses jaw claudication.  ESR elevated at 102. -continue tramadol for pain -started prednisone 32m daily for possible temporal arteritis -likely will need temporal a. biopsy   Left arm pain likely from OA -XR forearm/wrist showed osteoarthritis, XR Cspine negative for fracture.  -continue tramadol   Asymptomatic bacteruria Pt has no symptoms of UTI.  Levaquin started but was d/c.  DM II HA1c 8.5 here. Was taking 2 oral meds including glipizide and something else she doesn't remember. - will continue those on discharge. 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 Continue  coumadin, INR therapeutic.  Pulmonary nodules -follows with Ramaswamy   Lymphedema -d/c lasix -will refer to vascular upon d/c for further mgmt   Disposition Disposition is deferred, awaiting improvement of current medical problems.  Request records from PCP.  Continue PT/OT.     LOS: 2 days  JJones Bales MD PGY-2, Internal Medicine Teaching Service  06/14/2014, 11:18 AM

## 2014-06-14 NOTE — Progress Notes (Signed)
UR completed 

## 2014-06-14 NOTE — Evaluation (Signed)
Occupational Therapy Evaluation Patient Details Name: Karen Waters MRN: [id number] DOB: 08/12/1950 Today's Date: 06/14/2014    History of Present Illness 63 yo female hx of HTN, uterine cancer, DVT, chronic lymphaedema, dyspnea on exertion, chronic cough, brought by ambulance from home with left sided arm pain since yesterday with radiation down the arm, facial pain, and left sided weakness. Patient was on her way to the bathroom, was standing up with her walker, and passed out and her daughter/grandson caught her and assisted her to the floor. She had LOC for ~1 minute. She stated she passes out frequently but not sure why.     Clinical Impression   Pt admitted with above. Pt has assist with ADLs, PTA. Feel pt will benefit from acute OT to increase independence with BADLs as well as increase strength prior to d/c. Recommending HHOT upon d/c and pt agreeable to this.     Follow Up Recommendations  Home health OT;Supervision - Intermittent    Equipment Recommendations  3 in 1 bedside comode    Recommendations for Other Services       Precautions / Restrictions Precautions Precautions: Fall Restrictions Weight Bearing Restrictions: No      Mobility Bed Mobility Overal bed mobility: Needs Assistance Bed Mobility: Supine to Sit;Sit to Supine;Rolling Rolling: Modified independent (Device/Increase time)   Supine to sit: Supervision Sit to supine: Min assist   General bed mobility comments: assist with LE's to get back in bed. Cues for technique to scoot HOB.  Transfers Overall transfer level: Needs assistance Equipment used: Rolling walker (2 wheeled) Transfers: Sit to/from Stand Sit to Stand: Min guard;Supervision              Balance                                            ADL Overall ADL's : Needs assistance/impaired     Grooming: Wash/dry face;Oral care;Brushing hair;Min guard;Standing   Upper Body Bathing: Min guard;Standing   Lower  Body Bathing: Minimal assistance;Sit to/from stand   Upper Body Dressing : Min guard;Standing   Lower Body Dressing: Sit to/from stand;Minimal assistance   Toilet Transfer: Min guard;Ambulation;BSC;RW           Functional mobility during ADLs: Min guard;Rolling walker General ADL Comments: Educated on edema management techniques for LUE. Educated on safety tips for home (rugs, safe shoewear, sitting for most of LB ADLs). Educated on energy conservation techniques.  Educated on 3 in 1.  Educated on correct placement of walker with grooming tasks at sink. Elevated LUE at end of session. Educated on breathing technique-pt took a couple breaks in session. Explained there is a sockaid available to assist with LB dressing/cost/where to purchase. explained benefit of therapy.     Vision                     Perception     Praxis      Pertinent Vitals/Pain Pain Assessment: 0-10 Pain Score: 8  Pain Location: left side of face Pain Descriptors / Indicators: Sore Pain Intervention(s): Monitored during session     Hand Dominance     Extremity/Trunk Assessment Upper Extremity Assessment Upper Extremity Assessment: LUE deficits/detail LUE Deficits / Details: slight swelling in LUE   Lower Extremity Assessment Lower Extremity Assessment: Defer to PT evaluation       Communication Communication Communication: No  difficulties   Cognition Arousal/Alertness: Awake/alert Behavior During Therapy: WFL for tasks assessed/performed Overall Cognitive Status: Within Functional Limits for tasks assessed                     General Comments       Exercises       Shoulder Instructions      Home Living Family/patient expects to be discharged to:: Private residence Living Arrangements: Children (adult children ) Available Help at Discharge: Family;Available PRN/intermittently (has aide as well) Type of Home: House Home Access: Stairs to enter [employer] of  Steps: 2 Entrance Stairs-Rails: None Home Layout: One level     Bathroom Shower/Tub: Teacher, early years/pre: Standard     Home Equipment: Environmental consultant - 2 wheels;Wheelchair - manual;Grab bars - toilet;Tub bench   Additional Comments: Pt lives with daughter and son-in-law. Has an aide M-F 8-2pm.       Prior Functioning/Environment Level of Independence: Needs assistance    ADL's / Homemaking Assistance Needed: assist with tub transfer and LB dressing. Reports assist getting off commode   Comments: Requires aide assistance to get in and out of tub. Sleeps in recliner and has lift chair to stand.     OT Diagnosis: Acute pain;Generalized weakness   OT Problem List: Decreased strength;Decreased range of motion;Decreased activity tolerance;Decreased knowledge of use of DME or AE;Decreased knowledge of precautions;Pain;Obesity;Increased edema   OT Treatment/Interventions: Self-care/ADL training;Therapeutic exercise;DME and/or AE instruction;Energy conservation;Therapeutic activities;Patient/family education;Balance training    OT Goals(Current goals can be found in the care plan section) Acute Rehab OT Goals Patient Stated Goal: not stated OT Goal Formulation: With patient Time For Goal Achievement: 06/21/14 Potential to Achieve Goals: Good ADL Goals Pt Will Perform Grooming: with set-up;standing Pt Will Perform Lower Body Dressing: with supervision;with adaptive equipment;sit to/from stand;with set-up Pt Will Transfer to Toilet: with supervision;ambulating Pt Will Perform Toileting - Clothing Manipulation and hygiene: with modified independence;sitting/lateral leans;sit to/from stand  OT Frequency: Min 2X/week   Barriers to D/C:            Co-evaluation              End of Session Equipment Utilized During Treatment: Gait belt;Rolling walker  Activity Tolerance: Patient limited by fatigue Patient left: in bed;with call bell/phone within reach   Time:  7591-6384 OT Time Calculation (min): 27 min Charges:  OT General Charges $OT Visit: 1 Procedure OT Evaluation $Initial OT Evaluation Tier I: 1 Procedure OT Treatments $Self Care/Home Management : 8-22 mins G-Codes: OT G-codes **NOT FOR INPATIENT CLASS** Functional Assessment Tool Used: clinical judgment Functional Limitation: Self care Self Care Current Status (Y6599): At least 20 percent but less than 40 percent impaired, limited or restricted Self Care Goal Status (J5701): At least 1 percent but less than 20 percent impaired, limited or restricted  Benito Mccreedy OTR/L [phone] 06/14/2014, 9:54 AM

## 2014-06-14 NOTE — Progress Notes (Signed)
Faxed over authorization of health information form to patients PCP. Awaiting records to be sent.

## 2014-06-14 NOTE — Care Management Note (Signed)
    Page 1 of 1   06/14/2014     11:15:58 AM CARE MANAGEMENT NOTE 06/14/2014  Patient:  Karen Waters, Karen Waters   Account Number:  [id number]  Date Initiated:  06/14/2014  Documentation initiated by:  GRAVES-BIGELOW,Adeeb Konecny  Subjective/Objective Assessment:   Pt admitted for syncope. Pt lives with daughter.     Action/Plan:   CM did offer choice for HH services-PT/OT. Pt chose [facility] for services. SOC to begin within 24-48 hrs post d/c. Per pt she has DME RW, wheel chair and lift chair. No DME needs at this time.   Anticipated DC Date:  06/14/2014   Anticipated DC Plan:  [facility]  CM consult      [facility] Choice  HOME HEALTH   Choice offered to / List presented to:  C-1 Patient        [place] arranged  [place].   Status of service:   Medicare Important Message given?  NO (If response is "NO", the following Medicare IM given date fields will be blank) Date Medicare IM given:   Medicare IM given by:   Date Additional Medicare IM given:   Additional Medicare IM given by:    Discharge Disposition:  [place]  Per UR Regulation:  Reviewed for med. necessity/level of care/duration of stay  If discussed at [place] of Stay Meetings, dates discussed:    Comments:

## 2014-06-14 NOTE — Progress Notes (Signed)
EEG completed; results pending.    

## 2014-06-14 NOTE — Progress Notes (Addendum)
ANTICOAGULATION CONSULT NOTE - Follow Up Consult  Pharmacy Consult for warfarin Indication: hx DVT  Allergies  Allergen Reactions  . Darvon [Propoxyphene]     REACTIONS: hallucinations  . Percocet [Oxycodone-Acetaminophen]     REACTION: hallucinations  . Valium [Diazepam]     REACTION: hallucinations    Patient Measurements: Height: 5\' 5"  (165.1 cm) Weight: 214 lb (97.07 kg) IBW/kg (Calculated) : 57  Vital Signs: Temp: 97.8 F (36.6 C) (12/14 0740) Temp Source: Oral (12/14 0740) BP: 149/85 mmHg (12/14 0740) Pulse Rate: 62 (12/14 0740)  Labs:  Recent Labs  06/12/14 0825 06/12/14 0832 06/12/14 1302 06/12/14 1906 06/13/14 06/14/14 0345  HGB 12.3 13.6  --   --   --  12.4  HCT 37.4 40.0  --   --   --  38.6  PLT 279  --   --   --   --  298  LABPROT 26.2*  --   --   --  27.5* 28.6*  INR 2.38*  --   --   --  2.53* 2.67*  CREATININE 0.96 1.10  --   --  0.85 0.80  TROPONINI  --   --  <0.30 <0.30 <0.30  --     Estimated Creatinine Clearance: 80.8 mL/min (by C-G formula based on Cr of 0.8).   Assessment: 63 y/o female on chronic warfarin for hx of DVTs admitted 12/12 with L arm/facial pain and weakness. INR is therapeutic at 2.6 however started Levaquin 12/12 but discontinued yesterday which can increase sensitivity to warfarin. INR appears stable will change INR checks to MWF only.  PTA: 5 mg daily except 2.5 mg T/Th  Goal of Therapy:  INR 2-3 Monitor platelets by anticoagulation protocol: Yes   Plan:  - Resume home warfarin dose  - INR checks MWF - Monitor for s/sx of bleeding  Erin Hearing PharmD., BCPS Clinical Pharmacist Pager [phone] 06/14/2014 10:42 AM

## 2014-06-14 NOTE — Progress Notes (Signed)
Internal Medicine Attending  Date: 06/14/2014  Patient name: Karen Waters Medical record number: [id number] Date of birth: 11/15/1950 Age: 63 y.o. Gender: female  I saw and evaluated the patient, and discussed her care with resident on A.M rounds.  I reviewed the resident's note by Dr. Gill and I agree with the resident's findings and plans as documented in her note, with the following additional comments.  Patient was admitted following an episode of syncope at home, and reports a long history of episodic syncope which she says has never been worked up medically in the past.  She denies any premonitory symptoms.  She also reports pain in the left frontal and maxillary areas and pain with chewing.  Her syncope may have been due to volume depletion, although that is not clear.  Given the chronic recurrence, seizures are a possibility.  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  Plans include consult neurology; presumptive treatment with oral prednisone for possible temporal arteritis; she will need consultation or referral for a temporal artery biopsy in order to evaluate this possibility.  We also need to obtain her outside medical records. 

## 2014-06-14 NOTE — Discharge Instructions (Addendum)
You were admitted with syncope, left facial pain, and left arm pain. You should follow up with neurology for your syncope. 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 You need to get a temporal artery biopsy with outpatient surgery team.  Also follow up at our clinic. Start taking lantus 10 units daily. Don't take your glipizide or other tablets for your diabetes. Check your blood sugar 2x a day as you were already doing. Bring your record to the clinic.   Information on my medicine - Coumadin   (Warfarin)  Why was Coumadin prescribed for you? Coumadin was prescribed for you because you have a blood clot or a medical condition that can cause an increased risk of forming blood clots. Blood clots can cause serious health problems by blocking the flow of blood to the heart, lung, or brain. Coumadin can prevent harmful blood clots from forming. As a reminder your indication for Coumadin is:   Blood Clotting Disorder  What test will check on my response to Coumadin? While on Coumadin (warfarin) you will need to have an INR test regularly to ensure that your dose is keeping you in the desired range. The INR (international normalized ratio) number is calculated from the result of the laboratory test called prothrombin time (PT).  If an INR APPOINTMENT HAS NOT ALREADY BEEN MADE FOR YOU please schedule an appointment to have this lab work done by your health care provider within 7 days. Your INR goal is usually a number between:  2 to 3 or your provider may give you a more narrow range like 2-2.5.  Ask your health care provider during an office visit what your goal INR is.  What  do you need to  know  About  COUMADIN? Take Coumadin (warfarin) exactly as prescribed by your healthcare provider about the same time each day.  DO NOT stop taking without talking to the doctor who prescribed the medication.  Stopping without other blood clot prevention medication  to take the place of Coumadin may increase your risk of developing a new clot or stroke.  Get refills before you run out.  What do you do if you miss a dose? If you miss a dose, take it as soon as you remember on the same day then continue your regularly scheduled regimen the next day.  Do not take two doses of Coumadin at the same time.  Important Safety Information A possible side effect of Coumadin (Warfarin) is an increased risk of bleeding. You should call your healthcare provider right away if you experience any of the following: ? Bleeding from an injury or your nose that does not stop. ? Unusual colored urine (red or dark brown) or unusual colored stools (red or black). ? Unusual bruising for unknown reasons. ? A serious fall or if you hit your head (even if there is no bleeding).  Some foods or medicines interact with Coumadin (warfarin) and might alter your response to warfarin. To help avoid this: ? Eat a balanced diet, maintaining a consistent amount of Vitamin K. ? Notify your provider about major diet changes you plan to make. ? Avoid alcohol or limit your intake to 1 drink for women and 2 drinks for men per day. (1 drink is 5 oz. wine, 12 oz. beer, or 1.5 oz. liquor.)  Make sure that ANY health care provider who prescribes medication for you knows that you are taking Coumadin (warfarin).  Also make sure the  healthcare provider who is monitoring your Coumadin knows when you have started a new medication including herbals and non-prescription products.  Coumadin (Warfarin)  Major Drug Interactions  Increased Warfarin Effect Decreased Warfarin Effect  Alcohol (large quantities) Antibiotics (esp. Septra/Bactrim, Flagyl, Cipro) Amiodarone (Cordarone) Aspirin (ASA) Cimetidine (Tagamet) Megestrol (Megace) NSAIDs (ibuprofen, naproxen, etc.) Piroxicam (Feldene) Propafenone (Rythmol SR) Propranolol (Inderal) Isoniazid (INH) Posaconazole (Noxafil) Barbiturates  (Phenobarbital) Carbamazepine (Tegretol) Chlordiazepoxide (Librium) Cholestyramine (Questran) Griseofulvin Oral Contraceptives Rifampin Sucralfate (Carafate) Vitamin K   Coumadin (Warfarin) Major Herbal Interactions  Increased Warfarin Effect Decreased Warfarin Effect  Garlic Ginseng Ginkgo biloba Coenzyme Q10 Green tea St. Johns wort    Coumadin (Warfarin) FOOD Interactions  Eat a consistent number of servings per week of foods HIGH in Vitamin K (1 serving =  cup)  Collards (cooked, or boiled & drained) Kale (cooked, or boiled & drained) Mustard greens (cooked, or boiled & drained) Parsley *serving size only =  cup Spinach (cooked, or boiled & drained) Swiss chard (cooked, or boiled & drained) Turnip greens (cooked, or boiled & drained)  Eat a consistent number of servings per week of foods MEDIUM-HIGH in Vitamin K (1 serving = 1 cup)  Asparagus (cooked, or boiled & drained) Broccoli (cooked, boiled & drained, or raw & chopped) Brussel sprouts (cooked, or boiled & drained) *serving size only =  cup Lettuce, raw (green leaf, endive, romaine) Spinach, raw Turnip greens, raw & chopped   These websites have more information on Coumadin (warfarin):  FailFactory.se; VeganReport.com.au;

## 2014-06-14 NOTE — Procedures (Signed)
ELECTROENCEPHALOGRAM REPORT   Patient: Karen Waters       Room #: 1X-79 EEG No. ID: [id number] Age: 63 y.o.        Sex: female Referring Physician: Dr Marinda Elk Report Date:  06/14/2014        Interpreting Physician: Jim Like, Larkin Ina  History: Karen Waters is an 63 y.o. female hx head trauma as child presenting with history of multiple episodes of loss of consciousness.   Medications:  Scheduled: . atorvastatin  10 mg Oral q1800  . insulin aspart  0-15 Units Subcutaneous TID WC  . montelukast  10 mg Oral QHS  . multivitamin with minerals  1 tablet Oral Daily  . pantoprazole  40 mg Oral Daily  . predniSONE  40 mg Oral Q breakfast  . sodium chloride  1 spray Each Nare QID  . Vitamin D (Ergocalciferol)  50,000 Units Oral Q7 days  . warfarin  5 mg Oral Once per day on Sun Mon Wed Fri Sat   And  . [START ON 06/15/2014] warfarin  2.5 mg Oral Once per day on Tue Thu  . Warfarin - Pharmacist Dosing Inpatient   Does not apply q1800    Conditions of Recording:  This is a 16 channel EEG carried out with the patient in the awake state.  Description:  The waking background activity consists of a low voltage, symmetrical, fairly well organized, 9 to 10 Hz alpha activity, seen from the parieto-occipital and posterior temporal regions.  Low voltage fast activity, poorly organized, is seen anteriorly and is at times superimposed on more posterior regions.  A mixture of theta and alpha rhythms are seen from the central and temporal regions. There appears to be brief sharp activity predominantly at [facility]. This is not sustained and does not organize into epileptiform activity.  The patient drowses with slowing to irregular, low voltage theta and beta activity. The patient does not sleep.  Hyperventilation was not performed. Intermittent photic stimulation was performed but failed to illicit any change in the tracing.   IMPRESSION: Abnormal EEG due to sharp transients at [facility] on the left. This can  indicate a focal abnormality. Clinical correlation is suggested and further imaging may be warranted.    Jim Like, DO [facility] [phone]  If 7pm- 7am, please page neurology on call as listed in [place]. 06/14/2014, 2:24 PM

## 2014-06-14 NOTE — Progress Notes (Signed)
Physical Therapy Treatment Patient Details Name: Karen Waters MRN: [id number] DOB: 18-May-1951 Today's Date: 06/14/2014    History of Present Illness 63 yo Karen hx of HTN, uterine cancer, DVT, chronic lymphaedema, dyspnea on exertion, chronic cough, brought by ambulance from home with left sided arm pain since yesterday with radiation down the arm, facial pain, and left sided weakness. Patient was on her way to the bathroom, was standing up with her walker, and passed out and her daughter/grandson caught her and assisted her to the floor. She had LOC for ~1 minute. She stated she passes out frequently but not sure why.      PT Comments    Pt making steady progress.  Follow Up Recommendations  Home health PT (pt may decline)     Equipment Recommendations  None recommended by PT    Recommendations for Other Services       Precautions / Restrictions Precautions Precautions: Fall    Mobility  Bed Mobility Overal bed mobility: Needs Assistance Bed Mobility: Supine to Sit;Sit to Supine     Supine to sit: Supervision Sit to supine: Min assist   General bed mobility comments: assist with LE's to get back in bed.   Transfers Overall transfer level: Needs assistance Equipment used: Rolling walker (2 wheeled) Transfers: Sit to/from Stand Sit to Stand: Min guard         General transfer comment: Pt with incr effort to come to stand.  Ambulation/Gait Ambulation/Gait assistance: Min assist Ambulation Distance (Feet): 60 Feet Assistive device: Rolling walker (2 wheeled) Gait Pattern/deviations: Step-through pattern;Decreased step length - right;Decreased step length - left;Wide base of support;Trunk flexed   Gait velocity interpretation: Below normal speed for age/gender General Gait Details: Verbal cues to stand more erect.   Stairs            Wheelchair Mobility    Modified Rankin (Stroke Patients Only)       Balance Overall balance assessment: Needs  assistance Sitting-balance support: No upper extremity supported;Feet supported Sitting balance-Leahy Scale: Good     Standing balance support: Bilateral upper extremity supported Standing balance-Leahy Scale: Poor Standing balance comment: Support of walker for static standing                    Cognition Arousal/Alertness: Awake/alert Behavior During Therapy: WFL for tasks assessed/performed Overall Cognitive Status: Within Functional Limits for tasks assessed                      Exercises      General Comments        Pertinent Vitals/Pain      Home Living                      Prior Function            PT Goals (current goals can now be found in the care plan section) Progress towards PT goals: Progressing toward goals    Frequency  Min 2X/week    PT Plan Current plan remains appropriate    Co-evaluation             End of Session Equipment Utilized During Treatment: Gait belt Activity Tolerance: Patient tolerated treatment well Patient left: in bed;with call bell/phone within reach     Time: 1445-1501 PT Time Calculation (min) (ACUTE ONLY): 16 min  Charges:  $Gait Training: 8-22 mins  G Codes:      Karen Waters 06/14/2014, 3:40 PM  [facility] PT [phone]
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
## 2014-06-15 ENCOUNTER — Observation Stay (HOSPITAL_COMMUNITY): Payer: Medicare Other

## 2014-06-15 DIAGNOSIS — R55 Syncope and collapse: Secondary | ICD-10-CM | POA: Diagnosis not present

## 2014-06-15 LAB — URINE CULTURE

## 2014-06-15 LAB — GLUCOSE, CAPILLARY
Glucose-Capillary: 239 mg/dL — ABNORMAL HIGH (ref 70–99)
Glucose-Capillary: 151 mg/dL — ABNORMAL HIGH (ref 70–99)

## 2014-06-15 MED ORDER — PREDNISONE 20 MG PO TABS: 50.0000 mg | ORAL_TABLET | Freq: Every day | ORAL | Status: DC

## 2014-06-15 MED ORDER — VITAMIN D (ERGOCALCIFEROL) 1.25 MG (50000 UNIT) PO CAPS: 50000.0000 [IU] | ORAL_CAPSULE | ORAL | Status: DC

## 2014-06-15 MED ORDER — GADOBENATE DIMEGLUMINE 529 MG/ML IV SOLN
20.0000 mL | Freq: Once | INTRAVENOUS | Status: AC | PRN
  Administered 2014-06-15: 20 mL via INTRAVENOUS

## 2014-06-15 MED ORDER — PREDNISONE 10 MG PO TABS: 50.0000 mg | ORAL_TABLET | Freq: Every day | ORAL | Status: DC

## 2014-06-15 MED ORDER — INSULIN GLARGINE 100 UNIT/ML SOLOSTAR PEN: 10.0000 [IU] | PEN_INJECTOR | Freq: Every day | SUBCUTANEOUS | Status: DC

## 2014-06-15 MED ORDER — INSULIN PEN NEEDLE 29G X 12.7MM MISC: Status: DC

## 2014-06-15 NOTE — Progress Notes (Signed)
Occupational Therapy Treatment Patient Details Name: Karen Waters MRN: [id number] DOB: 26-Nov-1950 Today's Date: 06/15/2014    History of present illness 63 yo female hx of HTN, uterine cancer, DVT, chronic lymphaedema, dyspnea on exertion, chronic cough, brought by ambulance from home with left sided arm pain since yesterday with radiation down the arm, facial pain, and left sided weakness. Patient was on her way to the bathroom, was standing up with her walker, and passed out and her daughter/grandson caught her and assisted her to the floor. She had LOC for ~1 minute. She stated she passes out frequently but not sure why.     OT comments  Pt lethargic during session, suspect due to meds, but agreeable to therapy. Continue to recommend [place] upon d/c.  Follow Up Recommendations  Home health OT;Supervision - Intermittent    Equipment Recommendations  3 in 1 bedside comode    Recommendations for Other Services      Precautions / Restrictions Precautions Precautions: Fall Restrictions Weight Bearing Restrictions: No       Mobility Bed Mobility Overal bed mobility: Needs Assistance Bed Mobility: Supine to Sit;Sit to Supine     Supine to sit: Modified independent (Device/Increase time) (with rail) Sit to supine: Min assist   General bed mobility comments: Assist with LE when getting back in bed. Told pt what she could use to assist LE.  Transfers Overall transfer level: Needs assistance Equipment used: Rolling walker (2 wheeled) Transfers: Sit to/from Stand Sit to Stand: Supervision         General transfer comment: cues to reinforce technique.    Balance                                   ADL Overall ADL's : Needs assistance/impaired     Grooming: Wash/dry face;Oral care;Applying deodorant;Brushing hair;Sitting;Standing;Set up;Supervision/safety   Upper Body Bathing: Set up;Supervision/ safety;Standing   Lower Body Bathing: Sit to/from  stand;Minimal assistance   Upper Body Dressing : Sitting;Standing;Set up;Supervision/safety   Lower Body Dressing: Min guard;Sit to/from stand;With adaptive equipment   Toilet Transfer: Min guard;Ambulation;BSC;RW           Functional mobility during ADLs: Min guard;Rolling walker General ADL Comments: Discussed use of bag on walker and safety (sitting for LB ADLs). Educated on AE and pt practiced with sockaid. Pt able to donn/doff socks without AE, but states it is sometimes difficult and she gets assistance, so educated on AE/cost/where to purchase. Educated on energy conservation techniques-pt took a few breaks in session.      Vision                     Perception     Praxis      Cognition  Lethargic (suspect due to meds) Behavior During Therapy: WFL for tasks assessed/performed Overall Cognitive Status: Within Functional Limits for tasks assessed                       Extremity/Trunk Assessment               Exercises     Shoulder Instructions       General Comments      Pertinent Vitals/ Pain       Pain Assessment: 0-10 Pain Score:  (7-8) Pain Location: left side of face Pain Intervention(s): Monitored during session  Home Living  Prior Functioning/Environment              Frequency Min 2X/week     Progress Toward Goals  OT Goals(current goals can now be found in the care plan section)  Progress towards OT goals: Progressing toward goals  Acute Rehab OT Goals Patient Stated Goal: go home OT Goal Formulation: With patient Time For Goal Achievement: 06/21/14 Potential to Achieve Goals: Good ADL Goals Pt Will Perform Grooming: with set-up;standing Pt Will Perform Upper Body Bathing: with modified independence;sitting;standing Pt Will Perform Lower Body Bathing: with supervision;sit to/from stand;with adaptive equipment Pt Will Perform Lower Body Dressing: with  supervision;with adaptive equipment;sit to/from stand;with set-up Pt Will Transfer to Toilet: with supervision;ambulating Pt Will Perform Toileting - Clothing Manipulation and hygiene: with modified independence;sitting/lateral leans;sit to/from stand  Plan Discharge plan remains appropriate    Co-evaluation                 End of Session Equipment Utilized During Treatment: Gait belt;Rolling walker   Activity Tolerance Patient limited by fatigue   Patient Left in bed;with call bell/phone within reach   Nurse Communication          Time: 3557-3220 OT Time Calculation (min): 28 min  Charges: OT General Charges $OT Visit: 1 Procedure OT Treatments $Self Care/Home Management : 23-37 mins  Benito Mccreedy OTR/L [phone] 06/15/2014, 10:44 AM

## 2014-06-15 NOTE — Progress Notes (Signed)
Inpatient Diabetes Program Recommendations  AACE/ADA: New Consensus Statement on Inpatient Glycemic Control (2013)  Target Ranges:  Prepandial:   less than 140 mg/dL      Peak postprandial:   less than 180 mg/dL (1-2 hours)      Critically ill patients:  140 - 180 mg/dL    Results for Karen Waters, Karen Waters (MRN [id number]) as of 06/15/2014 09:56  Ref. Range 06/14/2014 07:41 06/14/2014 11:32 06/14/2014 16:59 06/14/2014 21:20  Glucose-Capillary Latest Range: 70-99 mg/dL 193 (H) 217 (H) 271 (H) 204 (H)    Results for Karen Waters, Karen Waters (MRN [id number]) as of 06/15/2014 09:56  Ref. Range 06/15/2014 07:34  Glucose-Capillary Latest Range: 70-99 mg/dL 239 (H)     Current Insulin Orders: Novolog Moderate SSI   **Patient getting Prednisone 40 mg daily.  **Fasting glucose and postprandial glucose levels elevated.  Eating 100% of meals.  **Note SSI increased to Moderate scale yesterday.    MD- Please consider the following in-hospital insulin adjustments while patient receiving PO steroids:  1. Add low dose Lantus if fasting glucose levels continue to stay elevated- Lantus 10 units QHS (0.1 units/kg dosing) 2. Add Novolog Meal Coverage- Novolog 3 units tid with meals    Will follow Wyn Quaker RN, MSN, CDE Diabetes Coordinator Inpatient Diabetes Program Team Pager: [phone] (8a-10p)   1.

## 2014-06-15 NOTE — Progress Notes (Signed)
Subjective: Patient continues to complain of headache but has had no further syncopal episodes.    Objective: Current vital signs: BP 118/42 mmHg  Pulse 66  Temp(Src) 97.6 F (36.4 C) (Oral)  Resp 20  Ht 5\' 5"  (1.651 m)  Wt 97.07 kg (214 lb)  BMI 35.61 kg/m2  SpO2 96% Vital signs in last 24 hours: Temp:  [97.6 F (36.4 C)-98 F (36.7 C)] 97.6 F (36.4 C) (12/15 0500) Pulse Rate:  [63-71] 66 (12/15 0500) Resp:  [16-20] 20 (12/15 0500) BP: (116-128)/(42-53) 118/42 mmHg (12/15 0500) SpO2:  [94 %-97 %] 96 % (12/15 0500) Weight:  [97.07 kg (214 lb)] 97.07 kg (214 lb) (12/15 0342)  Intake/Output from previous day: 12/14 0701 - 12/15 0700 In: 960 [P.O.:960] Out: -  Intake/Output this shift:   Nutritional status: Diet heart healthy/carb modified  Neurologic Exam: Mental Status: Alert, oriented, thought content appropriate. Speech fluent without evidence of aphasia. Able to follow 3 step commands without difficulty. Cranial Nerves: II: Discs flat bilaterally; Visual fields grossly normal, pupils equal, round, reactive to light and accommodation III,IV, VI: ptosis not present, extra-ocular motions intact bilaterally V,VII: smile symmetric, facial light touch sensation normal bilaterally VIII: hearing normal bilaterally IX,X: gag reflex present XI: bilateral shoulder shrug XII: midline tongue extension Motor: Right :Upper extremity 5/5Left: Upper extremity 5/5 Lower extremity 5/5Lower extremity 5/5 Tone and bulk:normal tone throughout; no atrophy noted Sensory: Pinprick and light touch intact throughout with decreased sensation in stocking distribution of LE Deep Tendon Reflexes: 2+ and symmetric throughout UE no KJ or AJ noted bilaterally Plantars: Right: downgoingLeft: downgoing  Lab Results: Basic Metabolic Panel:  Recent  Labs Lab 06/12/14 0825 06/12/14 0832 06/13/14 06/13/14 0051 06/14/14 0345  NA 141 140 140  --  134*  K 3.2* 3.0* 3.2*  --  4.6  CL 99 101 101  --  97  CO2 27  --  27  --  25  GLUCOSE 139* 143* 84  --  229*  BUN 27* 27* 21  --  17  CREATININE 0.96 1.10 0.85  --  0.80  CALCIUM 9.0  --  9.3  --  9.6  MG  --   --   --  1.6 2.0    Liver Function Tests: No results for input(s): AST, ALT, ALKPHOS, BILITOT, PROT, ALBUMIN in the last 168 hours. No results for input(s): LIPASE, AMYLASE in the last 168 hours. No results for input(s): AMMONIA in the last 168 hours.  CBC:  Recent Labs Lab 06/12/14 0825 06/12/14 0832 06/14/14 0345  WBC 9.6  --  5.5  HGB 12.3 13.6 12.4  HCT 37.4 40.0 38.6  MCV 86.8  --  87.9  PLT 279  --  298    Cardiac Enzymes:  Recent Labs Lab 06/12/14 1302 06/12/14 1906 06/13/14  TROPONINI <0.30 <0.30 <0.30    Lipid Panel:  Recent Labs Lab 06/12/14 1906  CHOL 187  TRIG 96  HDL 39*  CHOLHDL 4.8  VLDL 19  LDLCALC 129*    CBG:  Recent Labs Lab 06/14/14 1132 06/14/14 1659 06/14/14 2120 06/15/14 0734 06/15/14 1132  GLUCAP 217* 271* 204* 239* 151*    Microbiology: Results for orders placed or performed during the hospital encounter of 06/12/14  Culture, Urine     Status: None   Collection Time: 06/13/14  1:59 AM  Result Value Ref Range Status   Specimen Description URINE, RANDOM  Final   Special Requests ADDED 0531  Final   Culture  Setup Time   Final    06/13/2014 19:40 Performed at [facility]   Final    >=100,000 COLONIES/ML Performed at [facility] Performed at [facility]    Report Status 06/15/2014 FINAL  Final   Organism ID, Bacteria CITROBACTER YOUNGAE  Final      Susceptibility   Citrobacter youngae - MIC*    CEFAZOLIN >=64 RESISTANT Resistant     CEFTRIAXONE >=64 RESISTANT Resistant     CIPROFLOXACIN <=0.25 SENSITIVE Sensitive      GENTAMICIN <=1 SENSITIVE Sensitive     NITROFURANTOIN <=16 SENSITIVE Sensitive     TOBRAMYCIN <=1 SENSITIVE Sensitive     TRIMETH/SULFA <=20 SENSITIVE Sensitive     PIP/TAZO 64 INTERMEDIATE Intermediate     * CITROBACTER YOUNGAE    Coagulation Studies:  Recent Labs  06/13/14 06/14/14 0345  LABPROT 27.5* 28.6*  INR 2.53* 2.67*    Imaging: Mr Kizzie Fantasia Contrast  06/15/2014   CLINICAL DATA:  63 year old female with syncope. Frequent dizziness. New left upper extremity arm and hand pain. Left face pain for 2.5 months. Initial encounter.  EXAM: MRI HEAD WITHOUT AND WITH CONTRAST  TECHNIQUE: Multiplanar, multiecho pulse sequences of the brain and surrounding structures were obtained without and with intravenous contrast.  CONTRAST:  43mL MULTIHANCE GADOBENATE DIMEGLUMINE 529 MG/ML IV SOLN  COMPARISON:  Head CT without contrast 06/12/2014.  FINDINGS: No restricted diffusion to suggest acute infarction. No midline shift, mass effect, evidence of mass lesion, ventriculomegaly, extra-axial collection or acute intracranial hemorrhage. Cervicomedullary junction and pituitary are within normal limits. Negative visualized cervical spine. Major intracranial vascular flow voids are preserved.  Patchy bilateral cerebral white matter T2 and FLAIR hyperintensity. Extent is moderate for age. Configuration is nonspecific. No cortical encephalomalacia. No chronic blood products identified in the brain. Deep gray matter nuclei, brainstem and cerebellum are within normal limits. Visible internal auditory structures appear normal. Mild motion artifact on post-contrast images. No abnormal enhancement identified.  No skullbase abnormality identified. Normal bone marrow signal. Small nasopharyngeal retention cysts incidentally noted. Visualized paranasal sinuses and mastoids are clear. Visualized scalp soft tissues are within normal limits. Postoperative changes to both globes.  IMPRESSION: 1.  No acute intracranial  abnormality. 2. Moderate for age nonspecific white matter signal changes.   Electronically Signed   By: Lars Pinks M.D.   On: 06/15/2014 07:24    Medications:  I have reviewed the patient's current medications. Scheduled: . atorvastatin  10 mg Oral q1800  . insulin aspart  0-15 Units Subcutaneous TID WC  . montelukast  10 mg Oral QHS  . multivitamin with minerals  1 tablet Oral Daily  . pantoprazole  40 mg Oral Daily  . predniSONE  40 mg Oral Q breakfast  . sodium chloride  1 spray Each Nare QID  . Vitamin D (Ergocalciferol)  50,000 Units Oral Q7 days  . warfarin  2.5 mg Oral Once per day on Tue Thu  . Warfarin - Pharmacist Dosing Inpatient   Does not apply q1800    Assessment/Plan: No further syncopal episodes.  MRI of the brain personally reviewed and shows no acute changes.  Patient remains on Prednisone.  TA biopsy pending.   Recommendations: 1. No AED's at this time. 2. Agree with biopsy and continued steroids 3. No further neurologic intervention is recommended at this time.  If further questions arise, please call or page  at that time.  Thank you for allowing neurology to participate in the care of this patient.    LOS: 3 days   Alexis Goodell, MD [facility] [phone] 06/15/2014  12:57 PM    +

## 2014-06-15 NOTE — Discharge Summary (Signed)
Name: Karen Waters MRN: [id number] DOB: 1951-06-29 63 y.o. PCP: Alvester Chou, NP  Date of Admission: 06/12/2014  7:53 AM Date of Discharge: 06/15/2014 Attending Physician: Axel Filler, MD  Discharge Diagnosis:  Active Problems:   Syncope   CAD, multiple vessel    Syncope and collapse  Discharge Medications:   Medication List    STOP taking these medications        furosemide 40 MG tablet  Commonly known as:  LASIX     triamterene-hydrochlorothiazide 75-50 MG per tablet  Commonly known as:  MAXZIDE      TAKE these medications        atorvastatin 20 MG tablet  Commonly known as:  LIPITOR  Take 10 mg by mouth daily at 6 PM. 1/2 tablet qd     B-COMPLEX/B-12 PO  Take 1 tablet by mouth daily.     COUMADIN 5 MG tablet  Generic drug:  warfarin  - Take 2.5-5 mg by mouth daily. Tues and Thurs 2.5 mg, all other days 5 mg  -   - Pt takes med around 1600     DEXILANT 60 MG capsule  Generic drug:  dexlansoprazole  Take 60 mg by mouth daily.     fluticasone 50 MCG/ACT nasal spray  Commonly known as:  FLONASE  Place 2 sprays into both nostrils daily as needed for allergies.     guaiFENesin-dextromethorphan 100-10 MG/5ML syrup  Commonly known as:  ROBITUSSIN DM  Take 5 mLs by mouth every 4 (four) hours as needed for cough.     ibuprofen 200 MG tablet  Commonly known as:  ADVIL,MOTRIN  Take 400 mg by mouth every 8 (eight) hours as needed for mild pain or moderate pain.     Insulin Glargine 100 UNIT/ML Solostar Pen  Commonly known as:  LANTUS SOLOSTAR  Inject 10 Units into the skin daily at 10 pm.     montelukast 10 MG tablet  Commonly known as:  SINGULAIR  Take 10 mg by mouth at bedtime.     predniSONE 10 MG tablet  Commonly known as:  DELTASONE  Take 5 tablets (50 mg total) by mouth daily with breakfast.  Start taking on:  06/16/2014     Vitamin D (Ergocalciferol) 50000 UNITS Caps capsule  Commonly known as:  DRISDOL  Take 1 capsule (50,000 Units  total) by mouth every 7 (seven) days.        Disposition and follow-up:   Ms.Karen Waters was discharged from [facility] in Stable condition.  At the hospital follow up visit please address:  1.   Patient here with recurrent syncope. Workup negative, likely from volume depletion. Stopped lasix and maxzide. I doubt she needs to be on any medication for BP. 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 Consider outpatient vascular surgery follow up for her lymphadema.   Has neurology follow up, clinic will call.  Has left facial pain with TTP over temporal artery and ESR 102. 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 Has outpatient surgery follow up for temporal artery biopsy. Please follow up on the results. If negative, may need contralateral side biopsy. If confirmed, needs to be on prednisone for at least 1 year. Will slowly taper from 37m down by 10% every 2 weeks, then when dose reaches about 160m will decreased by 63m58mvery month for 12 months. Also consider insulin dose as you are tapering her prednisone dose.  Started newly on lantus  10 units daily for DM II, likely to worsen with prednisone. Told her to stop taking glipizide and other PO diabetes med (not sure which one she was taking other than glipizide). Asked her to check her blood sugar at home and to bring the records to the clinic. Please adjust insulin dose based on her readings at home.  Please check BMP. Stopped lasix, was on PO potassium before, I doubt she will need it since we stopped lasix. If K+ still low then can resume her K+ tablets, otherwise told her to hold it for now.  Has outpatient follow up with pulmonology to monitor her lung nodule. Please make sure she follows up with them with a CT chest in 3 months.   Elevated ESR - 102. al though temporal arteritis is the likely cause, please consider further workup for other causes of ESR elevation. Needs  age appropriate cancer screening and other health maintenance outpatient.  2.  Labs / imaging needed at time of follow-up: BMP.  3.  Pending labs/ test needing follow-up:   Follow-up Appointments: Follow-up Information    Follow up with Alvester Chou, NP.   Specialty:  Nurse Practitioner   Contact information:   [facility] [place] [address] [phone]       Follow up with [facility].   Why:  Physical and Occupational Therapy   Contact information:   [address] [place] [address] [phone]       Follow up with Odis Hollingshead, MD On 06/21/2014.   Specialty:  General Surgery   Why:  10:15 am. for temporal artery biopsy.    Contact information:   [address] [place] [address] [phone]       Follow up with Jenetta Downer, MD On 06/21/2014.   Specialty:  Internal Medicine   Why:  @ 3:45 pm.   Contact information:   [place] [address] [phone]       Follow up with [facility].   Why:  clinic will call you.      Discharge Instructions:   Consultations:    Procedures Performed:  Dg Cervical Spine Complete  06/12/2014   CLINICAL DATA:  Left-sided neck and arm pain for 2 and half months. No known injury. History of compression/ burst. Patient is unsteady with vomiting and shaking. Best images possible.  EXAM: CERVICAL SPINE  4+ VIEWS  COMPARISON:  None.  FINDINGS: There is no evidence of cervical spine fracture or prevertebral soft tissue swelling. Alignment is normal. Bones appear radiolucent. There is atherosclerotic calcification of the aorta and carotid arteries. Lung apices are clear.  IMPRESSION: No evidence for acute  abnormality.   Electronically Signed   By: Shon Hale M.D.   On: 06/12/2014 18:34   Dg Forearm Left  06/12/2014   CLINICAL DATA:  63 year old female with 2 day history of left arm pain. No known injury.  EXAM:  LEFT FOREARM - 2 VIEW  COMPARISON:  Concurrently obtained radiographs of the wrist  FINDINGS: There is no evidence of fracture or other focal bone lesions. Soft tissues are unremarkable.  IMPRESSION: Negative.   Electronically Signed   By: Jacqulynn Cadet M.D.   On: 06/12/2014 10:30   Dg Wrist Complete Left  06/12/2014   CLINICAL DATA:  63 year old female with 2 day history of left arm pain without known injury.  EXAM: LEFT WRIST - COMPLETE 3+ VIEW  COMPARISON:  Concurrently obtained radiographs of the right forearm  FINDINGS: No evidence of acute fracture or malalignment. No wrist joint effusion. Scattered atherosclerotic vascular calcifications noted along the course of the radial artery. Degenerative osteoarthritis present at the thumb [facility] joint.  IMPRESSION: 1. 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 2. Mild atherosclerotic vascular calcifications along the course of the radial artery.   Electronically Signed   By: Jacqulynn Cadet M.D.   On: 06/12/2014 10:29   Ct Head Wo Contrast  06/12/2014   CLINICAL DATA:  63 year old female with left-sided head pain after changing from Coumadin to Xarelto approximately 2 months ago. New onset left arm weakness and near syncope beginning yesterday  EXAM: CT HEAD WITHOUT CONTRAST  TECHNIQUE: Contiguous axial images were obtained from the base of the skull through the vertex without intravenous contrast.  COMPARISON:  None.  FINDINGS: Negative for acute intracranial hemorrhage, acute infarction, mass, mass effect, hydrocephalus or midline shift. Gray-white differentiation is preserved throughout. Periventricular, subcortical and white matter foci of hypoattenuation which are nonspecific but most suggestive of the sequelae of chronic microvascular ischemic white matter disease. No focal soft tissue or calvarial abnormality. Incompletely imaged right intra parotid lymph node. The at normal aeration of the mastoid air cells and visualized paranasal sinuses.   IMPRESSION: 1. No acute intracranial abnormality. 2. Chronic microvascular ischemic white matter disease.   Electronically Signed   By: Jacqulynn Cadet M.D.   On: 06/12/2014 09:03   Ct Angio Chest Pe W/cm &/or Wo Cm  06/12/2014   CLINICAL DATA:  63 year old female with near syncope. Currently on Xarelto. Past history of DVT and uterine cancer  EXAM: CT ANGIOGRAPHY CHEST WITH CONTRAST  TECHNIQUE: Multidetector CT imaging of the chest was performed using the standard protocol during bolus administration of intravenous contrast. Multiplanar CT image reconstructions and MIPs were obtained to evaluate the vascular anatomy.  CONTRAST:  51m OMNIPAQUE IOHEXOL 350 MG/ML SOLN  COMPARISON:  Prior CT scan of the chest 03/01/2014  FINDINGS: Mediastinum: Heterogeneously enlarged thyroid gland. No mediastinal mass or suspicious adenopathy. Unremarkable esophagus.  Heart/Vascular: Adequate opacification of the pulmonary arteries to the proximal subsegmental level. No central filling defect to suggest acute pulmonary embolus. Atherosclerotic vascular calcifications in the aorta. No aneurysmal dilatation, dissection or significant stenosis. Main pulmonary artery is within normal limits for size. Atherosclerotic calcifications noted throughout the coronary arteries including the left main, left anterior descending, circumflex and right coronary arteries. The heart remains within normal limits for size. No pericardial effusion.  Lungs/Pleura: No pleural effusion. The lungs are clear. Mild dependent atelectasis. Scattered areas of linear atelectasis versus pleural-parenchymal scarring in the inferior lingula and inferior right middle lobe. Stable 7 x 6 mm subpleural nodule in the medial aspect of the right lower lobe (image 50 series 406) compared to 03/01/2014. There is an adjacent 11 x 6 mm subpleural pulmonary nodule in the posterior aspect of the right lower lobe (image 53) which is slightly more conspicuous than previously  seen. This may represent a small focus of round atelectasis versus a true pulmonary nodule. Stable subpleural nodule affiliated with the right major fissure.  Bones/Soft Tissues: No acute fracture or aggressive appearing lytic or blastic osseous lesion. Stable chronic T8 compression fracture with vertebra plana appearance.  Upper Abdomen: Incompletely imaged IVC filter. Adreniform thickening of the left adrenal gland favored to reflect underlying adrenal hyperplasia. Otherwise, unremarkable.  Review of the MIP images confirms the above findings.  IMPRESSION: 1. Negative for acute pulmonary embolus.  2. Nonspecific subpleural nodular opacities in the posteromedial aspect of the right lower lobe. The smaller opacity is essentially unchanged compared 03/01/2014 while the larger opacity has slightly increased in size. This may represent a region of focal round atelectasis or true pulmonary nodularity. Given the size of the larger nodule at over 1 cm (11 mm) further evaluation with PET-CT should be considered. Alternately, short interval followup with a repeat chest CT in 3 months would be reasonable. 3. Atherosclerosis including multivessel coronary artery disease. 4. Incompletely imaged IVC filter.   Electronically Signed   By: Jacqulynn Cadet M.D.   On: 06/12/2014 12:05   Mr Jeri Cos IR Contrast  06/15/2014   CLINICAL DATA:  63 year old female with syncope. Frequent dizziness. New left upper extremity arm and hand pain. Left face pain for 2.5 months. Initial encounter.  EXAM: MRI HEAD WITHOUT AND WITH CONTRAST  TECHNIQUE: Multiplanar, multiecho pulse sequences of the brain and surrounding structures were obtained without and with intravenous contrast.  CONTRAST:  72m MULTIHANCE GADOBENATE DIMEGLUMINE 529 MG/ML IV SOLN  COMPARISON:  Head CT without contrast 06/12/2014.  FINDINGS: No restricted diffusion to suggest acute infarction. No midline shift, mass effect, evidence of mass lesion, ventriculomegaly,  extra-axial collection or acute intracranial hemorrhage. Cervicomedullary junction and pituitary are within normal limits. Negative visualized cervical spine. Major intracranial vascular flow voids are preserved.  Patchy bilateral cerebral white matter T2 and FLAIR hyperintensity. Extent is moderate for age. Configuration is nonspecific. No cortical encephalomalacia. No chronic blood products identified in the brain. Deep gray matter nuclei, brainstem and cerebellum are within normal limits. Visible internal auditory structures appear normal. Mild motion artifact on post-contrast images. No abnormal enhancement identified.  No skullbase abnormality identified. Normal bone marrow signal. Small nasopharyngeal retention cysts incidentally noted. Visualized paranasal sinuses and mastoids are clear. Visualized scalp soft tissues are within normal limits. Postoperative changes to both globes.  IMPRESSION: 1.  No acute intracranial abnormality. 2. Moderate for age nonspecific white matter signal changes.   Electronically Signed   By: LLars PinksM.D.   On: 06/15/2014 07:24    2D Echo:   Left ventricle: The cavity size was normal. Systolic function was normal. The estimated ejection fraction was in the range of 55% to 60%. Wall motion was normal; there were no regional wall motion abnormalities. Left ventricular diastolic function parameters were normal. - Left atrium: The atrium was mildly dilated.  Admission HPI:   63yo female hx of HTN, uterine cancer, DVT, chronic lymphaedema, dyspnea on exertion, chronic cough, brought by ambulance from home with left sided arm pain since yesterday with radiation down the arm, facial pain, and left sided weakness. Patient was on her way to the bathroom, was standing up with her walker, and passed out and her daughter/grandson caught her and assisted her to the floor. She had LOC for ~1 minute. No aura like symptoms, palpitations, cp, sob before, during, or after  the fall. No hx of seizures and no seizure like movements. She stated she passed out frequently but not sure why.   She also having left arm pain since yesterday, bad enough that she cannot make a fist. Pain starts from her fingers on the left hand ad goes up, stops above the left elbow. No weakness, just painful. Has some pain on her neck on the left side. States that she was put on xarelto few months ago and recently she developed some sinus area pain on the left side of her face and  it was thought to be from [place] and states it didn't improve with antibiotics. That's why she was switched to coumadin 1 month ago. She has been on anticoagulation for years (was on coumadin before xarelto), had 3-4 episodes of DVT (no PE). 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 Continues to have left facial pain, sinus tenderness, and radiation to her head and left neck. No rashes, no hx of shingles, never received the shingle vaccine. Has trouble chewing food because of the pain, but able to swallow fine without pain.  No fever,chills, SOB, chest pain. Had 2x emesis nb/nb in the ED, wasn't having nausea before. She states nausea started after getting dilaudid. No ab pain. No pain anywhere else.  Labs normal except K+ 3.0. INR 2.38. Trop neg.  Xray forearm left - negative for fracture. CT head - neg for any acute intracranial abnormality. Chronic microvascular ischemic white matter disease. Smokes 3/4 pack to 1 pack a day of cigarettes, no ETOH, no other substance usage. Lives with daughter and son in law. Moved in February 2015 from [place]. Has aide who cooks for her and helps her.  Takes lasix for chronic lymphaedema. She says she pees well when she is on lasix, otherwise doesn't pee. She states eating and drinking as usual.   Saw pulm on 03/22/14 with pulm. High res CT on 03/01/14 showed no evidence of ILD. Scattered pulm nodules upto 6 mm in RLL. Next CT  planned in 6 months (to be followed over next 2 years). PFT 03/01/14 was difficult to interpret b/c of coughing, wanted to repeat with cough under better control. Was taken off AceI but still having cough.    Hospital Course by problem list: Active Problems:   Syncope   CAD, multiple vessel    Syncope and collapse   62 yo female with HTN, DVT, chronic lymphaedema, here with left facial pain, left arm pain, and with syncope.  Syncope likely 2/2 to volume depletion- unclear etiology. 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 Had stress test few months ago which was normal per patient but no other workup was done according to her. She denies feeling dizzy, SOB, or palpitations. - could be orthostatic hypotension given she usually is standing up when this happens and she had her LOC yesterday when she got up and was walking to the bathroom. orthostat was checked after getting IVF, it was negative but doesn't rule out orthostatic hypotension causing syncope. Volume depletion is likely the cause of her synocope. Has AKi on admission which did respond to IVF, suggesting she was volume depleted.  - ECHO normal (normal EF 55-60%, no AS). Consulted EP Dr. Joylene Grapes for recs. He doesn't think she needs any cardiac workup, thinks she is likely volume depleted from the lasix and maxzide she was taking for chronic lymphaedema. Lasix is not a good treatment for that, usually sleeve and leg elevation is best for that. Stopped lasix and maxzide.  - will not give any BP med as BP normal and can increase her risk for syncope. d/ced her lasix and maxzide.  - neurology was consulted. EEG shows sharp transient F3 but based on her hx, neuro does not think this is from seizure. MRI was done which doesn't show any acute changes. They don't recommend any further neuro workup.  - needs outpatient neurology follow up. Neuro clinic will call.  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 ESR 102. Has TTP on temporal artery area on the left forehead and jaw claudication. suspicious for Giant cell arteritis. CT head did not show any sinusitis or abscess. Was treated for presume sinusitis with abx in the past without improvement. No rash suggesting shingles.  - MRI negative.  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 Pain still persisting today. Will increase prednisone to 512mdaily. Scheduled her for outpatient temporal artery biopsy on 06/21/2014.  - will continue prednisone 5043maily and has one time follow up at our clinic where the dose can be adjusted. If biopsy negative, will need to get biopsy of the contralateral side. If still negative, would have lower suspicision for Giant Cell arteritis. But this can be falsely negative by being on prednisone for several weeks. - if confirmed and if she is doing well on prednisone would need to taper prednisone 10% every 2 weeks until 50m77mily dose. Then will go down by 1mg 65mry month and treat her for total 1 year.  - if Giant cell arteritis is ruled out then may consider trying amitriptyline treatment for possible trigeminal neuralgia.  Elevated ESR - 102. al though temporal arteritis is the likely cause, please consider further workup for other causes of ESR elevation. Needs age appropriate cancer screening and other health maintenance outpatient.  Left arm pain - likely from OA - improved.  - Xray forearm/wrist showed osteoarthritis, Xray Cspine negative for fracture.   Asymptomatic bacteuria - UA positive for nitrite but she has no symptoms of UTI (no dysuria, frequency, mal odor as we confirmed today). Was started on Levaquin overnight. Will discontinue today.  AKI - crt was 1.10, likely pre-renal, now 0.80 with IVF. Monitor with another BMP at clinic.   DM II - hgba1c 8.5 here. Was taking 2 oral meds including glipizide and something else she doesn't remember. - will  continue those on discharge. SSI here. CBG's high after starting prednisone in high 200's. Requiring lot of insulin here. Is at risk of worsening of diabetes given we started on prednisone. Will need to closely monitor her blood sugar at home and clinic.  - will do lantus 10 units daily at home, will stop glipizide and other PO DM med she was taking (not sure which one), and will just keep on lantus 10 unit daily. Check blood glucose at home daily.   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 Continue coumadin, INR therapeutic.cont coumadin home regimen.  Hypokalemia - 2/2 to lasix likely. Will hold lasix.  - K+ normal now. Asked her to hold PO K+ tablet. Can restart if K+ still low at clinic BMP.  CAD - three vessel calcification on CT, but not ON ASA b/c of coumadin. Had normal stress test per patient few months ago.  HTN - was on lasix 60mg 78my and maxzide 75-50mg d58m. Normotensive here. - d/c both of these as she was mainly taking them for lymphadema and her BP is normal here. Lasix and maxzide not ideal for lymphaedema given the fact she is not volume overloaded (normal EF, normal lung exam, AKI). She was volume depleted and needs to stop taking these.  Chronic lymphadema - 2/2 to lymph node exicision for her uterine cancer per patient. was taking lasix for it. Will not give her that since she is not volume overloaded on exam, just her lymphadedema which is not treated best with lasix. Best thing is to use her lymphadema sleeves  and keep leg elevated. - may consider vascular surgery referral for her lymphadema outpatient.   Discharge Vitals:   BP 118/42 mmHg  Pulse 66  Temp(Src) 97.6 F (36.4 C) (Oral)  Resp 20  Ht 5' 5" (1.651 m)  Wt 214 lb (97.07 kg)  BMI 35.61 kg/m2  SpO2 96%  Discharge Labs:  Results for orders placed or performed during the hospital encounter of 06/12/14 (from the past 24 hour(s))  Glucose, capillary     Status: Abnormal   Collection Time: 06/14/14  4:59 PM  Result  Value Ref Range   Glucose-Capillary 271 (H) 70 - 99 mg/dL   Comment 1 Documented in Chart    Comment 2 Notify RN   Glucose, capillary     Status: Abnormal   Collection Time: 06/14/14  9:20 PM  Result Value Ref Range   Glucose-Capillary 204 (H) 70 - 99 mg/dL  Glucose, capillary     Status: Abnormal   Collection Time: 06/15/14  7:34 AM  Result Value Ref Range   Glucose-Capillary 239 (H) 70 - 99 mg/dL  Glucose, capillary     Status: Abnormal   Collection Time: 06/15/14 11:32 AM  Result Value Ref Range   Glucose-Capillary 151 (H) 70 - 99 mg/dL    Signed: Dellia Nims, MD 06/15/2014, 2:42 PM    Services Ordered on Discharge: home PT Equipment Ordered on Discharge:

## 2014-06-15 NOTE — Progress Notes (Signed)
Subjective: Arm pain has resolved. Left sided facial/forehead pain is still 6/10. Has some dizziness which she thinks is due to getting ativan before MRI and also the tramadol.   Denies any sob, cp, cough, n/v, fever, chills, numbness, weakness, tingling.  Objective: Vital signs in last 24 hours: Filed Vitals:   06/14/14 0740 06/14/14 1335 06/14/14 2100 06/15/14 0500  BP: 149/85 128/53 116/48 118/42  Pulse: 62 63 71 66  Temp: 97.8 F (36.6 C) 97.9 F (36.6 C) 98 F (36.7 C) 97.6 F (36.4 C)  TempSrc: Oral Oral Oral Oral  Resp: _0 Height:      Weight:      SpO2: 96% 97% 94% 96%   Weight change:   Intake/Output Summary (Last 24 hours) at 06/15/14 1150 Last data filed at 06/15/14 0500  Gross per 24 hour  Intake    720 ml  Output      0 ml  Net    720 ml   Vitals reviewed. General: sitting in bed, NAD HEENT: PERRL, EOMI, no scleral icterus. Has TTP on the right forehead area along the Temporal artery territory. Cardiac: RRR, no rubs, murmurs or gallops Pulm: clear to auscultation bilaterally, no wheezes, rales, or rhonchi Abd: soft, nontender, nondistended, BS present Ext: warm and well perfused, no pedal edema. Able to make fist today on the left arm, very mild pain today. Has full ROM on the left hand today. Other extremities normal. Neuro: alert and oriented X3, cranial nerves II-XII grossly intact, strength and sensation to light touch equal in bilateral upper and lower extremities  Lab Results: Basic Metabolic Panel:  Recent Labs Lab 06/13/14 06/13/14 0051 06/14/14 0345  NA 140  --  134*  K 3.2*  --  4.6  CL 101  --  97  CO2 27  --  25  GLUCOSE 84  --  229*  BUN 21  --  17  CREATININE 0.85  --  0.80  CALCIUM 9.3  --  9.6  MG  --  1.6 2.0   Liver Function Tests: No results for input(s): AST, ALT, ALKPHOS, BILITOT, PROT, ALBUMIN in the last 168 hours. No results for input(s): LIPASE, AMYLASE in the last 168 hours. No results for input(s):  AMMONIA in the last 168 hours. CBC:  Recent Labs Lab 06/12/14 0825 06/12/14 0832 06/14/14 0345  WBC 9.6  --  5.5  HGB 12.3 13.6 12.4  HCT 37.4 40.0 38.6  MCV 86.8  --  87.9  PLT 279  --  298   Cardiac Enzymes:  Recent Labs Lab 06/12/14 1302 06/12/14 1906 06/13/14  TROPONINI <0.30 <0.30 <0.30   BNP:  Recent Labs Lab 06/12/14 1906  PROBNP 235.8*   D-Dimer: No results for input(s): DDIMER in the last 168 hours. CBG:  Recent Labs Lab 06/14/14 0741 06/14/14 1132 06/14/14 1659 06/14/14 2120 06/15/14 0734 06/15/14 1132  GLUCAP 193* 217* 271* 204* 239* 151*   Hemoglobin A1C:  Recent Labs Lab 06/12/14 1906  HGBA1C 8.4*   Fasting Lipid Panel:  Recent Labs Lab 06/12/14 1906  CHOL 187  HDL 39*  LDLCALC 129*  TRIG 96  CHOLHDL 4.8   Thyroid Function Tests:  Recent Labs Lab 06/13/14 1452  TSH 1.850   Coagulation:  Recent Labs Lab 06/12/14 0825 06/13/14 06/14/14 0345  LABPROT 26.2* 27.5* 28.6*  INR 2.38* 2.53* 2.67*   Anemia Panel: No results for input(s): VITAMINB12, FOLATE, FERRITIN, TIBC, IRON, RETICCTPCT in the last 168 hours.  Urine Drug Screen: Drugs of Abuse     Component Value Date/Time   LABOPIA POSITIVE* 06/13/2014 0159   COCAINSCRNUR NONE DETECTED 06/13/2014 0159   LABBENZ NONE DETECTED 06/13/2014 0159   AMPHETMU NONE DETECTED 06/13/2014 0159   THCU NONE DETECTED 06/13/2014 0159   LABBARB NONE DETECTED 06/13/2014 0159    Alcohol Level: No results for input(s): ETH in the last 168 hours. Urinalysis:  Recent Labs Lab 06/13/14 0159  COLORURINE YELLOW  LABSPEC 1.033*  PHURINE 6.0  GLUCOSEU NEGATIVE  HGBUR NEGATIVE  BILIRUBINUR NEGATIVE  KETONESUR NEGATIVE  PROTEINUR NEGATIVE  UROBILINOGEN 0.2  NITRITE POSITIVE*  LEUKOCYTESUR NEGATIVE   Misc. Labs:  Micro Results: Recent Results (from the past 240 hour(s))  Culture, Urine     Status: None   Collection Time: 06/13/14  1:59 AM  Result Value Ref Range Status    Specimen Description URINE, RANDOM  Final   Special Requests ADDED 0531  Final   Culture  Setup Time   Final    06/13/2014 19:40 Performed at [facility]   Final    >=100,000 COLONIES/ML Performed at [place] Performed at [facility]    Report Status 06/15/2014 FINAL  Final   Organism ID, Bacteria CITROBACTER YOUNGAE  Final      Susceptibility   Citrobacter youngae - MIC*    CEFAZOLIN >=64 RESISTANT Resistant     CEFTRIAXONE >=64 RESISTANT Resistant     CIPROFLOXACIN <=0.25 SENSITIVE Sensitive     GENTAMICIN <=1 SENSITIVE Sensitive     NITROFURANTOIN <=16 SENSITIVE Sensitive     TOBRAMYCIN <=1 SENSITIVE Sensitive     TRIMETH/SULFA <=20 SENSITIVE Sensitive     PIP/TAZO 64 INTERMEDIATE Intermediate     * CITROBACTER YOUNGAE   Studies/Results: Mr Kizzie Fantasia Contrast  06/15/2014   CLINICAL DATA:  63 year old female with syncope. Frequent dizziness. New left upper extremity arm and hand pain. Left face pain for 2.5 months. Initial encounter.  EXAM: MRI HEAD WITHOUT AND WITH CONTRAST  TECHNIQUE: Multiplanar, multiecho pulse sequences of the brain and surrounding structures were obtained without and with intravenous contrast.  CONTRAST:  49m MULTIHANCE GADOBENATE DIMEGLUMINE 529 MG/ML IV SOLN  COMPARISON:  Head CT without contrast 06/12/2014.  FINDINGS: No restricted diffusion to suggest acute infarction. No midline shift, mass effect, evidence of mass lesion, ventriculomegaly, extra-axial collection or acute intracranial hemorrhage. Cervicomedullary junction and pituitary are within normal limits. Negative visualized cervical spine. Major intracranial vascular flow voids are preserved.  Patchy bilateral cerebral white matter T2 and FLAIR hyperintensity. Extent is moderate for age. Configuration is nonspecific. No cortical encephalomalacia. No chronic blood products identified in the brain. Deep gray  matter nuclei, brainstem and cerebellum are within normal limits. Visible internal auditory structures appear normal. Mild motion artifact on post-contrast images. No abnormal enhancement identified.  No skullbase abnormality identified. Normal bone marrow signal. Small nasopharyngeal retention cysts incidentally noted. Visualized paranasal sinuses and mastoids are clear. Visualized scalp soft tissues are within normal limits. Postoperative changes to both globes.  IMPRESSION: 1.  No acute intracranial abnormality. 2. Moderate for age nonspecific white matter signal changes.   Electronically Signed   By: LLars PinksM.D.   On: 06/15/2014 07:24   Medications: I have reviewed the patient's current medications. Scheduled Meds: . atorvastatin  10 mg Oral q1800  . insulin aspart  0-15 Units Subcutaneous TID WC  .  montelukast  10 mg Oral QHS  . multivitamin with minerals  1 tablet Oral Daily  . pantoprazole  40 mg Oral Daily  . predniSONE  40 mg Oral Q breakfast  . sodium chloride  1 spray Each Nare QID  . Vitamin D (Ergocalciferol)  50,000 Units Oral Q7 days  . warfarin  2.5 mg Oral Once per day on Tue Thu  . Warfarin - Pharmacist Dosing Inpatient   Does not apply q1800   Continuous Infusions:  PRN Meds:.fluticasone, guaiFENesin-dextromethorphan, ondansetron (ZOFRAN) IV, traMADol Assessment/Plan: Active Problems:   Syncope   CAD, multiple vessel    Syncope and collapse  63 yo female with HTN, DVT, chronic lymphaedema, here with left facial pain, left arm pain, and with syncope.  Syncope likely 2/2 to volume depletion- unclear etiology. 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 Had stress test few months ago which was normal per patient but no other workup was done according to her. She denies feeling dizzy, SOB, or palpitations. - could be orthostatic hypotension given she usually is standing up when this happens and she had her LOC  yesterday when she got up and was walking to the bathroom. orthostat was checked after getting IVF, it was negative but doesn't rule out orthostatic hypotension causing syncope. Volume depletion is likely the cause of her synocope. Has AKi on admission which did respond to IVF, suggesting she was volume depleted.  - ECHO normal (normal EF 55-60%, no AS). Consulted EP Dr. Joylene Grapes for recs. He doesn't think she needs any cardiac workup, thinks she is likely volume depleted from the lasix and maxzide she was taking for chronic lymphaedema. Lasix is not a good treatment for that, usually sleeve and leg elevation is best for that. Stopped lasix and maxzide.  - will not give any BP med as BP normal and can increase her risk for syncope. - neurology was consulted. EEG shows sharp transient F3 but based on her hx, neuro does not think this is from seizure. MRI was done which doesn't show any acute changes. They don't recommend any further neuro workup.  - needs outpatient neurology follow up. Clinic will call.  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 ESR 102. Has TTP on temporal artery area on the left forehead and jaw claudication. suspicious for Giant cell arteritis. CT head did not show any sinusitis or abscess. Was treated for presume sinusitis with abx in the past without improvement. No rash suggesting shingles.  - MRI negative.  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 Pain still persisting today. Will increase prednisone to 532mdaily. Scheduled her for outpatient temporal artery biopsy on 06/21/2014.  - will continue prednisone 5039maily and has one time follow up at our clinic where the dose can be adjusted. If biopsy negative, will need to get biopsy of the contralateral side. If still negative, would have lower suspicision for Giant Cell arteritis. But this can be impacted by being on prednisone for several weeks. - if confirmed and if she is doing well on prednisone  would need to taper prednisone 10% every 2 weeks until 32m3mily dose. Then will go down by 1mg 70mry month and treat her for total 1 year.  - if Giant cell arteritis is ruled out then may consider trying amitriptyline treatment for possible trigeminal neuralgia.  Left arm pain - likely from OA - improved.  - Xray forearm/wrist showed osteoarthritis, Xray Cspine  negative for fracture.   Asymptomatic bacteuria - UA positive for nitrite but she has no symptoms of UTI (no dysuria, frequency, mal odor as we confirmed today). Was started on Levaquin overnight. Will discontinue today.  AKI - crt was 1.10, likely pre-renal, now 0.80 with IVF  DM II - hgba1c 8.5 here. Was taking 2 oral meds including glipizide and something else she doesn't remember. - will continue those on discharge. SSI here. CBG's high after starting prednisone in high 200's. Requiring lot of insulin here. Is at risk of worsening of diabetes given we started on prednisone. Will need to closely monitor her blood sugar at home and clinic.  - may need to be on insulin as well.  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 Continue coumadin, INR therapeutic.  Hypokalemia - 2/2 to lasix likely. Will hold lasix. Was taking lasix for lymphadema, which doesn't help with it since the cause if lymphatic damage, not volume overload.   CAD - three vessel calcification on CT, but not ON ASA b/c of coumadin. Had normal stress test per patient few months ago.  HTN - was on lasix 4m daily and maxzide 75-529mdaily. Normotensive here. - d/c both of these as she was mainly taking them for lymphadema and her BP is normal here. Lasix and maxzide not ideal for lymphaedema given the fact she is not volume overloaded (normal EF, normal lung exam, AKI). She was volume depleted and needs to stop taking these.  Chronic lymphadema - 2/2 to lymph node exicision for her uterine cancer per patient. was taking lasix for it. Will not give her that since she is not volume  overloaded on exam, just her lymphadedema which is not treated best with lasix. Best thing is to use her lymphadema sleeves and keep leg elevated. - may consider vascular surgery referral for her lymphadema outpatient.  Dispo: Disposition is deferred at this time, awaiting improvement of current medical problems.  Anticipated discharge in approximately 1-2 day(s).   The patient does have a current PCP (JAlvester ChouNP) and [facility].  The patient does have transportation limitations that hinder transportation to clinic appointments.  .Services Needed at time of discharge: Y = Yes, Blank = No PT:   OT:   RN:   Equipment:   Other:     LOS: 3 days   TaDellia NimsMD 06/15/2014, 11:50 AM

## 2014-06-21 ENCOUNTER — Ambulatory Visit (INDEPENDENT_AMBULATORY_CARE_PROVIDER_SITE_OTHER): Payer: Medicare Other | Admitting: Neurology

## 2014-06-21 ENCOUNTER — Encounter (INDEPENDENT_AMBULATORY_CARE_PROVIDER_SITE_OTHER): Payer: Self-pay | Admitting: General Surgery

## 2014-06-21 ENCOUNTER — Ambulatory Visit: Payer: Medicare Other | Admitting: Internal Medicine

## 2014-06-21 ENCOUNTER — Encounter: Payer: Self-pay | Admitting: Internal Medicine

## 2014-06-21 ENCOUNTER — Ambulatory Visit (INDEPENDENT_AMBULATORY_CARE_PROVIDER_SITE_OTHER): Payer: Medicare Other | Admitting: Internal Medicine

## 2014-06-21 ENCOUNTER — Encounter: Payer: Self-pay | Admitting: Neurology

## 2014-06-21 VITALS — BP 146/68 | HR 77 | Temp 97.7°F | Wt 235.7 lb

## 2014-06-21 VITALS — BP 144/69 | HR 72 | Ht 65.5 in | Wt 235.0 lb

## 2014-06-21 DIAGNOSIS — R55 Syncope and collapse: Secondary | ICD-10-CM

## 2014-06-21 DIAGNOSIS — R519 Headache, unspecified: Secondary | ICD-10-CM

## 2014-06-21 DIAGNOSIS — I10 Essential (primary) hypertension: Secondary | ICD-10-CM

## 2014-06-21 DIAGNOSIS — IMO0002 Reserved for concepts with insufficient information to code with codable children: Secondary | ICD-10-CM

## 2014-06-21 DIAGNOSIS — I890 Lymphedema, not elsewhere classified: Secondary | ICD-10-CM

## 2014-06-21 DIAGNOSIS — F05 Delirium due to known physiological condition: Secondary | ICD-10-CM

## 2014-06-21 DIAGNOSIS — E1165 Type 2 diabetes mellitus with hyperglycemia: Secondary | ICD-10-CM

## 2014-06-21 DIAGNOSIS — R51 Headache: Secondary | ICD-10-CM

## 2014-06-21 LAB — BASIC METABOLIC PANEL
BUN: 20 mg/dL (ref 6–23)
CALCIUM: 9.4 mg/dL (ref 8.4–10.5)
CO2: 29 mEq/L (ref 19–32)
CREATININE: 0.75 mg/dL (ref 0.50–1.10)
Chloride: 105 mEq/L (ref 96–112)
GLUCOSE: 92 mg/dL (ref 70–99)
Potassium: 4.7 mEq/L (ref 3.5–5.3)
Sodium: 145 mEq/L (ref 135–145)

## 2014-06-21 LAB — SEDIMENTATION RATE: Sed Rate: 23 mm/hr — ABNORMAL HIGH (ref 0–22)

## 2014-06-21 MED ORDER — FUROSEMIDE 20 MG PO TABS: 40.0000 mg | ORAL_TABLET | Freq: Every day | ORAL | Status: DC

## 2014-06-21 NOTE — Progress Notes (Signed)
Patient ID: Karen Waters, female   DOB: Jan 15, 1951, 63 y.o.   MRN: [id number]   Karen Waters 06/21/2014 10:25 AM Location: [facility] Patient #: [id number] DOB: October 12, 1950 Divorced / Language: Cleophus Molt / Race: White Female  History of Present Illness Odis Hollingshead MD; 06/21/2014 11:05 AM) Patient words: eva temp bx artery.  The patient is a 63 year old female   Note:She is referred by Dr. Dellia Nims because of left facial pain, elevated ESR and concern for left temporal arteritis. She was recently hospitalized because of a syncopal episode and had these complaints as well. She's been on long-term anticoagulation and she was switched to Xarelto to a few months ago. The pain began after she was changed to Xarelto. She was started on high-dose prednisone and the pain is somewhat better. There have not been any visual changes. The pain is around the upper jaw area and also around her eye and radiates to her left temporal area. She is now back on Coumadin as well.   Allergies (Sonya Bynum, CMA; 06/21/2014 10:27 AM) Darvon *ANALGESICS - OPIOID* Percocet *ANALGESICS - OPIOID* Valium *ANTIANXIETY AGENTS*  Medication History (Sonya Bynum, CMA; 06/21/2014 10:26 AM) Coumadin (5MG Tablet, Oral) Active. PredniSONE (10MG Tablet, Oral) Active. Lantus SoloStar (100UNIT/ML Soln Pen-inj, Subcutaneous) Active. Dexilant (60MG Capsule DR, Oral) Active. Atorvastatin Calcium (20MG Tablet, Oral) Active. Montelukast Sodium (10MG Tablet, Oral) Active.  Physical Exam Odis Hollingshead MD; 06/21/2014 11:06 AM) The physical exam findings are as follows: Note:General: Overweight female in NAD, in a wheelchair. Pleasant and cooperative.  HEENT: [place]/AT, no facial masses, no facial or temporal tenderness  NECK: Supple, no obvious mass or thyroid enlargement.  NEUROLOGIC: Alert and oriented, answers questions appropriately.  PSYCHIATRIC: Normal mood, affect , and  behavior.    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 She is on steroids for this. Her symptoms are a little better.  Plan: Schedule left temporal artery biopsy. We'll need to stop her Coumadin 5 days before the surgery and restart it 2 days after the surgery. The procedure and risks were discussed with her. The risks include but are not limited to bleeding, infection, wound healing problems, reactions to the anesthesia. She seems to understand and agrees with the plan. Current Plans  Schedule for Surgery  Jackolyn Confer, MD

## 2014-06-21 NOTE — Patient Instructions (Signed)
Overall you are doing fairly well but I do want to suggest a few things today:   Remember to drink plenty of fluid, eat healthy meals and do not skip any meals. Try to eat protein with a every meal and eat a healthy snack such as fruit or nuts in between meals. Try to keep a regular sleep-wake schedule and try to exercise daily, particularly in the form of walking, 20-30 minutes a day, if you can.   As far as your medications are concerned, I would like to suggest: continue prednisone  As far as diagnostic testing: EEG, Carotid Dopplers  I would like to see you back in 3 months, sooner if we need to. Please call us with any interim questions, concerns, problems, updates or refill requests.   Please also call us for any test results so we can go over those with you on the phone.  My clinical assistant and will answer any of your questions and relay your messages to me and also relay most of my messages to you.   Our phone number is [phone]. We also have an after hours call service for urgent matters and there is a physician on-call for urgent questions. For any emergencies you know to call 911 or go to the nearest emergency room

## 2014-06-21 NOTE — Assessment & Plan Note (Addendum)
3 month hx of left sided headache, area was tender to palpation on admission, also with pain involving the TMJ, ESR elevated at 102. While on addmision pt was started on prednsione, initially 50m and then increased to 534mwhen she had no response. The plan was to gradually titrate prednisone dose- reduce by 10% when patient starts to notice some improvement in her headaches (Can refer to discharge summary- 06/15/2014) Today pt reports no improvement in her headaches. Pt is scheduled to see general surgery for temporal artery biopsy, and plan is if resulst are negative, pt should have a biopsy of the right side. Other considerations are for trigeminal neuralgia.  Blood sugars elevated, pt is diabetic but also on steroids, but could not address issue, pt was late for her appointment and had to leave early for appointment with general surgery.   Plan- Reacess with results after temporal artery biopsy. - Cont present dose of prednisone- 503maily. - ESR check today, to see trend while on prednisone.
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
## 2014-06-21 NOTE — Assessment & Plan Note (Signed)
Of bilateral lower extremity. Lasix- 40mg  BID per patients daughter and triamterene-HCTZ- 37.5- 25mg  was discontinued on admission. As pts presented with syncope, thought to be due to orthostatic hypotension/volume depletion. Today leg swelling has significantly increased as per family, since been off lasix. Weight on discharge- 214. No SOB, or abdominal distension. Today her weight is 235. BP- 146/68.   Plan- Will restart lasix at low dose- 20mg  daily, can uptitrate on follow up. - Bmet today, monitor Cr.

## 2014-06-21 NOTE — Progress Notes (Signed)
Patient ID: Karen Waters, female   DOB: 10-19-1950, 63 y.o.   MRN: [id number]   Subjective:   Patient ID: Karen Waters female   DOB: 1951-05-29 63 y.o.   MRN: [id number]  HPI: Karen Waters is a 63 y.o. with PMH listed below presenetd today for hospital follow-up. Pt was admitted 06/12/2014 and discharged 06/15/2014. Pt was managed for syncope which was attributed to Orthostatic hypotension. Pt was on diyuretics for lymphedema- which was stopped. Also addressed on admission was left sided headache with jaw claudication. Pt was started on prednisone and scheduled to see surgery for temporal artery biopsy today. Pt reports no improvement in her headache since discharge. Pt denies tenderness on palpation of the left and right side of her face, she does endorse pain in the temporo-mandibular joint with any chewing movements or motion of jaw, No fevers, vision changes, no body aches or joint pain.  She was seen by her NP- who is her primary care provider and prescribed a course of hydrocodone which did not help, also suggested patient might be having trigeminal neuralgia and recommended patient start Carbamazepine. Family wants patient to establish care in our clinic, and no longer want her previous provider to provide care. Pt today also says that the swelling in her lower extremity has significantly increased since lasix was stopped on admission.   Past Medical History  Diagnosis Date  . Uterine cancer   . DVT (deep venous thrombosis)   . Lymphedema of both lower extremities   . Obesity   . Tobacco abuse    Current Outpatient Prescriptions  Medication Sig Dispense Refill  . atorvastatin (LIPITOR) 20 MG tablet Take 10 mg by mouth daily at 6 PM. 1/2 tablet qd    . B Complex Vitamins (B-COMPLEX/B-12 PO) Take 1 tablet by mouth daily.    Marland Kitchen COUMADIN 5 MG tablet Take 2.5-5 mg by mouth daily. Tues and Thurs 2.5 mg, all other days 5 mg  Pt takes med around 1600    . DEXILANT 60 MG capsule Take 60  mg by mouth daily.     . fluticasone (FLONASE) 50 MCG/ACT nasal spray Place 2 sprays into both nostrils daily as needed for allergies.     Marland Kitchen guaiFENesin-dextromethorphan (ROBITUSSIN DM) 100-10 MG/5ML syrup Take 5 mLs by mouth every 4 (four) hours as needed for cough.    Marland Kitchen ibuprofen (ADVIL,MOTRIN) 200 MG tablet Take 400 mg by mouth every 8 (eight) hours as needed for mild pain or moderate pain.    . Insulin Glargine (LANTUS SOLOSTAR) 100 UNIT/ML Solostar Pen Inject 10 Units into the skin daily at 10 pm. 15 mL 11  . Insulin Pen Needle 29G X 12.7MM MISC Use with lantus solostar pen. Inject 10 unit daily lantus. 100 each 0  . montelukast (SINGULAIR) 10 MG tablet Take 10 mg by mouth at bedtime.     . predniSONE (DELTASONE) 10 MG tablet Take 5 tablets (50 mg total) by mouth daily with breakfast. 150 tablet 0  . Vitamin D, Ergocalciferol, (DRISDOL) 50000 UNITS CAPS capsule Take 1 capsule (50,000 Units total) by mouth every 7 (seven) days. 8 capsule 0   No current facility-administered medications for this visit.   Family History  Problem Relation Age of Onset  . Hypertension     History   Social History  . Marital Status: Divorced    Spouse Name: N/A    Number of Children: N/A  . Years of Education: N/A   Occupational History  . retired  Social History Main Topics  . Smoking status: Current Every Day Smoker -- 1.00 packs/day for 47 years    Types: Cigarettes  . Smokeless tobacco: Never Used  . Alcohol Use: No  . Drug Use: No  . Sexual Activity: Not on file   Other Topics Concern  . Not on file   Social History Narrative   Review of Systems: CONSTITUTIONAL- No Fever, weightloss, night sweat or change in appetite. SKIN- No Rash, colour changes or itching. HEAD- No Headache or dizziness. EYES- No Vision loss, pain, redness, double or blurred vision. RESPIRATORY- No Cough or SOB. CARDIAC- No Palpitations, DOE, PND or chest pain. GI- No nausea, vomiting, diarrhoea, constipation,  abd pain. URINARY- No Frequency, urgency, straining or dysuria.  Objective:  Physical Exam: Filed Vitals:   06/21/14 0850  BP: 146/68  Pulse: 77  Temp: 97.7 F (36.5 C)  TempSrc: Oral  Weight: 235 lb 11.2 oz (106.913 kg)  SpO2: 98%   GENERAL- alert, co-operative, appears as stated age, not in any distress. HEENT- Atraumatic, normocephalic, PERRL, EOMI, oral mucosa appears moist, neck supple, no tenderness on palpation of temporal area today. CARDIAC- RRR, no murmurs, rubs or gallops. RESP- Moving equal volumes of air, no wheezes or crackles. ABDOMEN- Soft, nontender, no guarding or rebound, no palpable masses or organomegaly, bowel sounds present. NEURO- No obvious Cr N abnormality, strenght upper and lower extremities-intact. EXTREMITIES- +2 pitting edema to lower knees, calfs significantly enlarged- uniform bilaterally, no redness or wamth, no tenderness.  SKIN- Warm, dry, No rash or lesion. PSYCH- Normal mood and affect, appropriate thought content and speech.  Assessment & Plan:   The patient's case and plan of care was discussed with attending physician, Dr. Daryll Drown.  Please see problem based charting for assessment and plan.

## 2014-06-21 NOTE — Patient Instructions (Signed)
General Instructions:  We will be starting you on lasix- 20mg  once a day. We will like you to follow up with [place] in 1 week.    Please bring your medicines with you each time you come to clinic.  Medicines may include prescription medications, over-the-counter medications, herbal remedies, eye drops, vitamins, or other pills.

## 2014-06-22 DIAGNOSIS — E1165 Type 2 diabetes mellitus with hyperglycemia: Secondary | ICD-10-CM | POA: Insufficient documentation

## 2014-06-22 DIAGNOSIS — IMO0002 Reserved for concepts with insufficient information to code with codable children: Secondary | ICD-10-CM | POA: Insufficient documentation

## 2014-06-22 NOTE — Assessment & Plan Note (Signed)
Pt had to leave early and came late. No time to address this issue. Blood sugars elevated on glucometer. Pt is on steroids- 50mg  daily. On lantus- 10u daily started on admission. 06/12/2014- HgbA1c- 8.4. Was previously on Glipizide and another medication-she cannot remember the name.   Plan- please address on follow up. - Consider addition of metformin.

## 2014-06-23 NOTE — Progress Notes (Signed)
Internal Medicine Clinic Attending  Case discussed with Dr. Denton Brick soon after the resident saw the patient.  We reviewed the resident's history and exam and pertinent patient test results.  I agree with the assessment, diagnosis, and plan of care documented in the resident's note.  Headache not improving after > 1 week of steroids is suggestive of another diagnosis.  If biopsy is negative, would consider other diagnoses.

## 2014-07-05 ENCOUNTER — Ambulatory Visit (INDEPENDENT_AMBULATORY_CARE_PROVIDER_SITE_OTHER): Payer: Medicare Other | Admitting: Neurology

## 2014-07-05 ENCOUNTER — Telehealth: Payer: Self-pay | Admitting: Neurology

## 2014-07-05 DIAGNOSIS — R55 Syncope and collapse: Secondary | ICD-10-CM

## 2014-07-05 DIAGNOSIS — F05 Delirium due to known physiological condition: Secondary | ICD-10-CM

## 2014-07-05 NOTE — Procedures (Signed)
    History:  Karen Waters is a 64 year old patient with a history of episodes of syncope on average 3 times a year since she was 64 years old. The patient had an event around 06/12/2014 associated with dimming and loss of vision, and left-sided weakness. The patient is being evaluated for this episode. A prior EEG study showed left frontal sharp wave activity.  This is a routine EEG study. No skull defects are noted. Medications include Lipitor, Coumadin, excellent, Flonase, Lasix, hydrocodone, insulin, prednisone, and vitamin D.   EEG classification: Normal awake and asleep  Description of the recording: The background rhythms of this recording consists of a fairly well modulated medium amplitude background activity of 8 Hz. As the record progresses, the patient initially is in the waking state, but appears to enter the early stage II sleep during the recording, with rudimentary sleep spindles and vertex sharp wave activity seen. During the wakeful state, photic stimulation is performed, and this results in a bilateral and symmetric photic driving response. Hyperventilation was also performed, and this results in a minimal buildup of the background rhythm activities without significant slowing seen. At no time during the recording does there appear to be evidence of spike or spike wave discharges or evidence of focal slowing. EKG monitor shows no evidence of cardiac rhythm abnormalities with a heart rate of 78.  Impression: This is a normal EEG recording in the waking and sleeping state. No evidence of ictal or interictal discharges were seen at any time during the recording.

## 2014-07-05 NOTE — Telephone Encounter (Signed)
I called the patient. The EEG study done today appear to be unremarkable in the awake and sleeping state.

## 2014-07-06 ENCOUNTER — Other Ambulatory Visit (HOSPITAL_COMMUNITY): Payer: Self-pay | Admitting: Neurology

## 2014-07-06 DIAGNOSIS — R55 Syncope and collapse: Secondary | ICD-10-CM

## 2014-07-19 ENCOUNTER — Ambulatory Visit (HOSPITAL_COMMUNITY): Payer: Medicare Other

## 2014-07-23 ENCOUNTER — Encounter (HOSPITAL_COMMUNITY): Admission: RE | Payer: Self-pay | Source: Ambulatory Visit

## 2014-07-23 ENCOUNTER — Ambulatory Visit (HOSPITAL_COMMUNITY): Admission: RE | Admit: 2014-07-23 | Payer: Medicare Other | Source: Ambulatory Visit | Admitting: General Surgery

## 2014-07-23 SURGERY — BIOPSY TEMPORAL ARTERY
Anesthesia: Choice | Laterality: Left

## 2014-08-12 ENCOUNTER — Other Ambulatory Visit: Payer: Self-pay | Admitting: Internal Medicine

## 2014-08-16 ENCOUNTER — Ambulatory Visit (HOSPITAL_COMMUNITY): Payer: Medicare Other

## 2014-08-23 ENCOUNTER — Ambulatory Visit (HOSPITAL_COMMUNITY)
Admission: RE | Admit: 2014-08-23 | Discharge: 2014-08-23 | Disposition: A | Discharge status: Home / Self Care | Payer: Medicare Other | Source: Ambulatory Visit | Attending: Neurology | Admitting: Neurology

## 2014-08-23 DIAGNOSIS — R55 Syncope and collapse: Secondary | ICD-10-CM | POA: Insufficient documentation

## 2014-08-23 NOTE — Progress Notes (Signed)
VASCULAR LAB PRELIMINARY  PRELIMINARY  PRELIMINARY  PRELIMINARY  Carotid duplex completed.    Preliminary report:  Bilateral:  1-39% ICA stenosis.  Vertebral artery flow is antegrade.     Venia Riveron, RVS 08/23/2014, 3:42 PM

## 2014-08-24 ENCOUNTER — Telehealth: Payer: Self-pay | Admitting: *Deleted

## 2014-08-24 NOTE — Telephone Encounter (Signed)
Talked with patient about results per Dr. Jaynee Eagles notes. Patient verbalized understanding. Also scheduled a follow-up appointment for 08/23/14 at 300pm because she did not have one scheduled.

## 2014-08-27 ENCOUNTER — Encounter: Payer: Self-pay | Admitting: Internal Medicine

## 2014-08-30 ENCOUNTER — Other Ambulatory Visit: Payer: Self-pay | Admitting: Internal Medicine

## 2014-09-10 ENCOUNTER — Emergency Department (HOSPITAL_COMMUNITY)
Admission: EM | Admit: 2014-09-10 | Discharge: 2014-09-10 | Disposition: A | Discharge status: Home / Self Care | Payer: Medicare Other | Attending: Emergency Medicine | Admitting: Emergency Medicine

## 2014-09-10 ENCOUNTER — Emergency Department (HOSPITAL_COMMUNITY): Payer: Medicare Other

## 2014-09-10 ENCOUNTER — Encounter (HOSPITAL_COMMUNITY): Payer: Self-pay | Admitting: *Deleted

## 2014-09-10 DIAGNOSIS — Z79899 Other long term (current) drug therapy: Secondary | ICD-10-CM | POA: Diagnosis not present

## 2014-09-10 DIAGNOSIS — Z8542 Personal history of malignant neoplasm of other parts of uterus: Secondary | ICD-10-CM | POA: Diagnosis not present

## 2014-09-10 DIAGNOSIS — E669 Obesity, unspecified: Secondary | ICD-10-CM | POA: Insufficient documentation

## 2014-09-10 DIAGNOSIS — Z794 Long term (current) use of insulin: Secondary | ICD-10-CM | POA: Diagnosis not present

## 2014-09-10 DIAGNOSIS — S51002A Unspecified open wound of left elbow, initial encounter: Secondary | ICD-10-CM | POA: Diagnosis not present

## 2014-09-10 DIAGNOSIS — Z720 Tobacco use: Secondary | ICD-10-CM | POA: Diagnosis not present

## 2014-09-10 DIAGNOSIS — T148XXA Other injury of unspecified body region, initial encounter: Secondary | ICD-10-CM

## 2014-09-10 DIAGNOSIS — S52531A Colles' fracture of right radius, initial encounter for closed fracture: Secondary | ICD-10-CM | POA: Insufficient documentation

## 2014-09-10 DIAGNOSIS — Y92002 Bathroom of unspecified non-institutional (private) residence single-family (private) house as the place of occurrence of the external cause: Secondary | ICD-10-CM | POA: Insufficient documentation

## 2014-09-10 DIAGNOSIS — Z86718 Personal history of other venous thrombosis and embolism: Secondary | ICD-10-CM | POA: Diagnosis not present

## 2014-09-10 DIAGNOSIS — R220 Localized swelling, mass and lump, head: Secondary | ICD-10-CM | POA: Insufficient documentation

## 2014-09-10 DIAGNOSIS — S0083XA Contusion of other part of head, initial encounter: Secondary | ICD-10-CM | POA: Diagnosis not present

## 2014-09-10 DIAGNOSIS — S6991XA Unspecified injury of right wrist, hand and finger(s), initial encounter: Secondary | ICD-10-CM | POA: Diagnosis present

## 2014-09-10 DIAGNOSIS — Z7951 Long term (current) use of inhaled steroids: Secondary | ICD-10-CM | POA: Diagnosis not present

## 2014-09-10 DIAGNOSIS — E119 Type 2 diabetes mellitus without complications: Secondary | ICD-10-CM | POA: Diagnosis not present

## 2014-09-10 DIAGNOSIS — M1990 Unspecified osteoarthritis, unspecified site: Secondary | ICD-10-CM | POA: Diagnosis not present

## 2014-09-10 DIAGNOSIS — E876 Hypokalemia: Secondary | ICD-10-CM | POA: Insufficient documentation

## 2014-09-10 DIAGNOSIS — W01198A Fall on same level from slipping, tripping and stumbling with subsequent striking against other object, initial encounter: Secondary | ICD-10-CM | POA: Diagnosis not present

## 2014-09-10 DIAGNOSIS — S41102A Unspecified open wound of left upper arm, initial encounter: Secondary | ICD-10-CM | POA: Diagnosis not present

## 2014-09-10 DIAGNOSIS — Y9301 Activity, walking, marching and hiking: Secondary | ICD-10-CM | POA: Insufficient documentation

## 2014-09-10 DIAGNOSIS — Z8679 Personal history of other diseases of the circulatory system: Secondary | ICD-10-CM | POA: Diagnosis not present

## 2014-09-10 DIAGNOSIS — Y998 Other external cause status: Secondary | ICD-10-CM | POA: Insufficient documentation

## 2014-09-10 DIAGNOSIS — S0091XA Abrasion of unspecified part of head, initial encounter: Secondary | ICD-10-CM | POA: Diagnosis not present

## 2014-09-10 DIAGNOSIS — S0081XA Abrasion of other part of head, initial encounter: Secondary | ICD-10-CM | POA: Diagnosis not present

## 2014-09-10 DIAGNOSIS — W19XXXA Unspecified fall, initial encounter: Secondary | ICD-10-CM

## 2014-09-10 DIAGNOSIS — T07XXXA Unspecified multiple injuries, initial encounter: Secondary | ICD-10-CM

## 2014-09-10 HISTORY — DX: Unspecified osteoarthritis, unspecified site: M19.90

## 2014-09-10 LAB — BASIC METABOLIC PANEL
ANION GAP: 9 (ref 5–15)
BUN: 13 mg/dL (ref 6–23)
CO2: 31 mmol/L (ref 19–32)
Calcium: 9 mg/dL (ref 8.4–10.5)
Chloride: 102 mmol/L (ref 96–112)
Creatinine, Ser: 0.89 mg/dL (ref 0.50–1.10)
GFR calc Af Amer: 77 mL/min — ABNORMAL LOW (ref >90)
GFR calc non Af Amer: 67 mL/min — ABNORMAL LOW (ref >90)
GLUCOSE: 176 mg/dL — AB (ref 70–99)
Potassium: 2.8 mmol/L — ABNORMAL LOW (ref 3.5–5.1)
SODIUM: 142 mmol/L (ref 135–145)

## 2014-09-10 LAB — CBC
HCT: 44 % (ref 36.0–46.0)
Hemoglobin: 14.6 g/dL (ref 12.0–15.0)
MCH: 28.9 pg (ref 26.0–34.0)
MCHC: 33.2 g/dL (ref 30.0–36.0)
MCV: 87 fL (ref 78.0–100.0)
Platelets: 275 10*3/uL (ref 150–400)
RBC: 5.06 MIL/uL (ref 3.87–5.11)
RDW: 15.9 % — AB (ref 11.5–15.5)
WBC: 18.6 10*3/uL — AB (ref 4.0–10.5)

## 2014-09-10 LAB — CBG MONITORING, ED: Glucose-Capillary: 136 mg/dL — ABNORMAL HIGH (ref 70–99)

## 2014-09-10 LAB — PROTIME-INR
INR: 1.56 — AB (ref 0.00–1.49)
PROTHROMBIN TIME: 18.8 s — AB (ref 11.6–15.2)

## 2014-09-10 MED ORDER — SILVER SULFADIAZINE 1 % EX CREA
TOPICAL_CREAM | Freq: Once | CUTANEOUS | Status: AC
  Administered 2014-09-10: 13:00:00 via TOPICAL
  Filled 2014-09-10: qty 85

## 2014-09-10 MED ORDER — POTASSIUM CHLORIDE CRYS ER 20 MEQ PO TBCR
40.0000 meq | EXTENDED_RELEASE_TABLET | Freq: Once | ORAL | Status: AC
  Administered 2014-09-10: 40 meq via ORAL
  Filled 2014-09-10: qty 2

## 2014-09-10 MED ORDER — TRAMADOL HCL 50 MG PO TABS
50.0000 mg | ORAL_TABLET | Freq: Once | ORAL | Status: AC
  Administered 2014-09-10: 50 mg via ORAL
  Filled 2014-09-10: qty 1

## 2014-09-10 MED ORDER — POTASSIUM CHLORIDE CRYS ER 20 MEQ PO TBCR: 20.0000 meq | EXTENDED_RELEASE_TABLET | Freq: Every day | ORAL | Status: DC

## 2014-09-10 MED ORDER — FENTANYL CITRATE 0.05 MG/ML IJ SOLN
12.5000 ug | Freq: Once | INTRAMUSCULAR | Status: AC
  Administered 2014-09-10: 12.5 ug via INTRAVENOUS
  Filled 2014-09-10: qty 2

## 2014-09-10 NOTE — ED Provider Notes (Signed)
CSN: [id number]     Arrival date & time 09/10/14  4401 History   First MD Initiated Contact with Patient 09/10/14 [phone]     Chief Complaint  Patient presents with  . Fall     (Consider location/radiation/quality/duration/timing/severity/associated sxs/prior Treatment) HPI Comments: Patient presents to the ER by ambulance after a fall at home. Patient reports that she tripped over an object while walking from the bathroom and fell forward. She tried to brace her fall with her right arm, fell onto the right wrist. She hit her face on the ground, no loss of consciousness. Complaining of pain and swelling of the nose and pain of the right wrist. Right wrist pain is moderate to severe and worsens with movement. No neck or back pain. She did not lose consciousness.  Patient is a 64 y.o. female presenting with fall.  Fall Pertinent negatives include no chest pain and no headaches.    Past Medical History  Diagnosis Date  . Uterine cancer   . DVT (deep venous thrombosis)   . Lymphedema of both lower extremities   . Obesity   . Tobacco abuse   . Arthritis   . Diabetes mellitus without complication    Past Surgical History  Procedure Laterality Date  . Tubal ligation    . Abdominal hysterectomy    . Back surgery    . Cystocele repair     Family History  Problem Relation Age of Onset  . Hypertension     History  Substance Use Topics  . Smoking status: Current Every Day Smoker -- 1.00 packs/day for 47 years    Types: Cigarettes  . Smokeless tobacco: Never Used  . Alcohol Use: No   OB History    No data available     Review of Systems  HENT: Positive for facial swelling. Negative for dental problem.   Cardiovascular: Negative for chest pain and palpitations.  Musculoskeletal: Negative for back pain and neck pain.  Neurological: Negative for dizziness, seizures, syncope and headaches.  All other systems reviewed and are negative.     Allergies  Darvon; Percocet; and  Valium  Home Medications   Prior to Admission medications   Medication Sig Start Date End Date Taking? Authorizing Provider  atorvastatin (LIPITOR) 20 MG tablet Take 10 mg by mouth daily at 6 PM. 1/2 tablet qd 11/11/13   Historical Provider, MD  B Complex Vitamins (B-COMPLEX/B-12 PO) Take 1 tablet by mouth daily.    Historical Provider, MD  COUMADIN 5 MG tablet Take 2.5-5 mg by mouth daily. Tues and Thurs 2.5 mg, all other days 5 mg  Pt takes med around 1600 11/11/13   Historical Provider, MD  DEXILANT 60 MG capsule Take 60 mg by mouth daily.  11/11/13   Historical Provider, MD  fluticasone (FLONASE) 50 MCG/ACT nasal spray Place 2 sprays into both nostrils daily as needed for allergies.  11/11/13   Historical Provider, MD  furosemide (LASIX) 20 MG tablet Take 2 tablets (40 mg total) by mouth daily. 06/21/14   Ejiroghene Arlyce Dice, MD  guaiFENesin-dextromethorphan (ROBITUSSIN DM) 100-10 MG/5ML syrup Take 5 mLs by mouth every 4 (four) hours as needed for cough.    Historical Provider, MD  HYDROcodone-acetaminophen [facility]) 7.5-325 MG per tablet  06/18/14   Historical Provider, MD  ibuprofen (ADVIL,MOTRIN) 200 MG tablet Take 400 mg by mouth every 8 (eight) hours as needed for mild pain or moderate pain.    Historical Provider, MD  Insulin Glargine (LANTUS SOLOSTAR) 100 UNIT/ML  Solostar Pen Inject 10 Units into the skin daily at 10 pm. 06/15/14   Dellia Nims, MD  Insulin Pen Needle 29G X 12.7MM MISC Use with lantus solostar pen. Inject 10 unit daily lantus. 06/15/14   Tasrif Ahmed, MD  montelukast (SINGULAIR) 10 MG tablet Take 10 mg by mouth at bedtime.  11/11/13   Historical Provider, MD  potassium chloride SA (K-DUR,KLOR-CON) 20 MEQ tablet Take 1 tablet (20 mEq total) by mouth daily. 09/10/14   Orpah Greek, MD  predniSONE (DELTASONE) 10 MG tablet Take 5 tablets (50 mg total) by mouth daily with breakfast. 06/16/14   Dellia Nims, MD  Flossie Buffy SHORT PEN NEEDLES 31G X 8 MM MISC  06/15/14    Historical Provider, MD  Vitamin D, Ergocalciferol, (DRISDOL) 50000 UNITS CAPS capsule Take 1 capsule (50,000 Units total) by mouth every 7 (seven) days. 06/15/14   Tasrif Ahmed, MD   BP 119/58 mmHg  Pulse 104  Temp(Src) 97.9 F (36.6 C) (Oral)  Resp 18  SpO2 95% Physical Exam  Constitutional: She is oriented to person, place, and time. She appears well-developed and well-nourished. No distress.  HENT:  Head: Normocephalic. Head is with abrasion and with contusion.  Right Ear: Hearing normal.  Left Ear: Hearing normal.  Nose: Sinus tenderness present. No nasal septal hematoma. No epistaxis.    Mouth/Throat: Oropharynx is clear and moist and mucous membranes are normal.  Eyes: Conjunctivae and EOM are normal. Pupils are equal, round, and reactive to light.  Neck: Normal range of motion. Neck supple.  Cardiovascular: Regular rhythm, S1 normal and S2 normal.  Exam reveals no gallop and no friction rub.   No murmur heard. Pulmonary/Chest: Effort normal and breath sounds normal. No respiratory distress. She exhibits no tenderness.  Abdominal: Soft. Normal appearance and bowel sounds are normal. There is no hepatosplenomegaly. There is no tenderness. There is no rebound, no guarding, no tenderness at McBurney's point and negative Murphy's sign. No hernia.  Musculoskeletal: Normal range of motion.  Neurological: She is alert and oriented to person, place, and time. She has normal strength. No cranial nerve deficit or sensory deficit. Coordination normal. GCS eye subscore is 4. GCS verbal subscore is 5. GCS motor subscore is 6.  Skin: Skin is warm and dry. Abrasion noted. No rash noted. No cyanosis.     multiple linear skin tear his left deltoid, multiple small skin tears left elbowl, one large linear skin tear left forearm  Psychiatric: She has a normal mood and affect. Her speech is normal and behavior is normal. Thought content normal.  Nursing note and vitals reviewed.   ED Course    Procedures (including critical care time) Labs Review Labs Reviewed  CBC - Abnormal; Notable for the following:    WBC 18.6 (*)    RDW 15.9 (*)    All other components within normal limits  BASIC METABOLIC PANEL - Abnormal; Notable for the following:    Potassium 2.8 (*)    Glucose, Bld 176 (*)    GFR calc non Af Amer 67 (*)    GFR calc Af Amer 77 (*)    All other components within normal limits  PROTIME-INR - Abnormal; Notable for the following:    Prothrombin Time 18.8 (*)    INR 1.56 (*)    All other components within normal limits  CBG MONITORING, ED - Abnormal; Notable for the following:    Glucose-Capillary 136 (*)    All other components within normal limits  Imaging Review Dg Chest 2 View  09/10/2014   CLINICAL DATA:  64 year old female who fell over bathroom chair this morning. Initial encounter.  EXAM: CHEST  2 VIEW  COMPARISON:  Chest CTA 06/12/2014.  FINDINGS: Large body habitus. Increased AP dimension to the chest. Normal cardiac size and mediastinal contours. No pneumothorax, pulmonary edema, pleural effusion or consolidation. Severe T8 compression fracture re - identified. There is a new severe T11 compression fracture since 06/02/2014. IVC filter in place. Fracture of the proximal right humerus appears to be stable judging from the scout view of the comparison. Chronic right lateral rib fractures.  IMPRESSION: 1. Severe compression fracture of T11 is new since December 2015. If specific therapy such as vertebroplasty is desired, thoracic MRI or whole-body bone scan would best determine acuity. 2. Stable severe T8 compression fracture. Proximal right humerus fracture appears to be chronic and stable. 3.  No acute cardiopulmonary abnormality.   Electronically Signed   By: Genevie Ann M.D.   On: 09/10/2014 11:39   Dg Wrist Complete Right  09/10/2014   CLINICAL DATA:  Tripped over bathroom chair.  Pain.  EXAM: RIGHT WRIST - COMPLETE 3+ VIEW  COMPARISON:  Hand films same date   FINDINGS: Comminuted, likely intra-articular distal radius fracture. Dorsally displaced and impacted.  Ulnar styloid fracture.  Degenerative changes at base of the thumb.  IMPRESSION: Comminuted intra-articular impacted distal radius fracture.  Ulnar styloid fracture.   Electronically Signed   By: Abigail Miyamoto M.D.   On: 09/10/2014 11:16   Ct Head Wo Contrast  09/10/2014   CLINICAL DATA:  Fall in shower.  Facial bruising  EXAM: CT HEAD WITHOUT CONTRAST  CT MAXILLOFACIAL WITHOUT CONTRAST  TECHNIQUE: Multidetector CT imaging of the head and maxillofacial structures were performed using the standard protocol without intravenous contrast. Multiplanar CT image reconstructions of the maxillofacial structures were also generated.  COMPARISON:  CT 06/12/2014  FINDINGS: CT HEAD FINDINGS  Mild atrophy. Moderate chronic microvascular ischemic change in the white matter is stable  Negative for acute infarct.  Negative for hemorrhage or mass lesion.  Calvarium is intact.  CT MAXILLOFACIAL FINDINGS  Image quality degraded by motion.  Negative for facial fracture. No orbital fracture. Mandible is degraded by motion however no fracture identified. Advanced degenerative change right TMJ with joint space narrowing and spurring  Mild mucosal edema in the paranasal sinuses.  No air-fluid level.  IMPRESSION: Atrophy and chronic microvascular ischemia. No acute intracranial abnormality.  Negative for facial fracture.   Electronically Signed   By: Franchot Gallo M.D.   On: 09/10/2014 10:51   Dg Hand Complete Right  09/10/2014   CLINICAL DATA:  Tripping injury with deformity of the wrist. Lacerations in the hand.  EXAM: RIGHT HAND - COMPLETE 3+ VIEW  COMPARISON:  None.  FINDINGS: Colles fracture of the distal radius. Transverse fracture of the ulnar styloid. I do not observe definite extension into the distal radial articular surface.  Degenerative spurring at the first carpometacarpal articulation. Interphalangeal osteoarthritis.   IMPRESSION: 1. Colles fracture, distal radius. Transverse fracture of the ulnar styloid. 2. Osteoarthritis.   Electronically Signed   By: Van Clines M.D.   On: 09/10/2014 11:17   Ct Maxillofacial Wo Cm  09/10/2014   CLINICAL DATA:  Fall in shower.  Facial bruising  EXAM: CT HEAD WITHOUT CONTRAST  CT MAXILLOFACIAL WITHOUT CONTRAST  TECHNIQUE: Multidetector CT imaging of the head and maxillofacial structures were performed using the standard protocol without intravenous contrast. Multiplanar CT  image reconstructions of the maxillofacial structures were also generated.  COMPARISON:  CT 06/12/2014  FINDINGS: CT HEAD FINDINGS  Mild atrophy. Moderate chronic microvascular ischemic change in the white matter is stable  Negative for acute infarct.  Negative for hemorrhage or mass lesion.  Calvarium is intact.  CT MAXILLOFACIAL FINDINGS  Image quality degraded by motion.  Negative for facial fracture. No orbital fracture. Mandible is degraded by motion however no fracture identified. Advanced degenerative change right TMJ with joint space narrowing and spurring  Mild mucosal edema in the paranasal sinuses.  No air-fluid level.  IMPRESSION: Atrophy and chronic microvascular ischemia. No acute intracranial abnormality.  Negative for facial fracture.   Electronically Signed   By: Franchot Gallo M.D.   On: 09/10/2014 10:51     EKG Interpretation None      MDM   Final diagnoses:  Fall  Multiple abrasions  Multiple skin tears  Colles' fracture, right, closed, initial encounter  Hypokalemia    Patient presents to the ER for evaluation after a fall. Patient had a mechanical fall where she tripped and fell forward. She presents herself with her right arm and injured her right wrist. X-ray confirms Fracture. This is a closed fracture. Patient had bruising and swelling over the bridge of nose. She also takes Coumadin, therefore CT head was performed. CT maxillofacial bones included. No acute abnormality was  seen. Chest x-ray was performed because she had complained of some left rib pain initially, but there is no pain currently. She has not tender in the area. No acute abnormality is seen. There is a compression fracture of T11 that was not present in December of last year. She is not tender in this area. Further history gathering reveals that she did have acute onset of back pain around Christmas time last year that was treated as a pulled muscle. This was likely the time when the compression fracture occurred. She is no longer experiencing pain and there is no tenderness in the region, no further treatment is necessary.  Patient's lab work reveals hypokalemia. She was given 40 mEq here in the ER and will continue daily replacement. Follow up with primary doctor in 1 week for recheck.  Patient also found to have subtherapeutic INR. With her wrist fracture, I do not want to significantly augment her anticoagulation at this time. Can have it redrawn in 1 week when she visits her doctor.  Discussed with Dr. Jeannie Fend. He will see the patient in the office today for treatment of the wrist.    Orpah Greek, MD 09/10/14 1248

## 2014-09-10 NOTE — Discharge Instructions (Signed)
Abrasion An abrasion is a cut or scrape of the skin. Abrasions do not extend through all layers of the skin and most heal within 10 days. It is important to care for your abrasion properly to prevent infection. CAUSES  Most abrasions are caused by falling on, or gliding across, the ground or other surface. When your skin rubs on something, the outer and inner layer of skin rubs off, causing an abrasion. DIAGNOSIS  Your caregiver will be able to diagnose an abrasion during a physical exam.  TREATMENT  Your treatment depends on how large and deep the abrasion is. Generally, your abrasion will be cleaned with water and a mild soap to remove any dirt or debris. An antibiotic ointment may be put over the abrasion to prevent an infection. A bandage (dressing) may be wrapped around the abrasion to keep it from getting dirty.  You may need a tetanus shot if:  You cannot remember when you had your last tetanus shot.  You have never had a tetanus shot.  The injury broke your skin. If you get a tetanus shot, your arm may swell, get red, and feel warm to the touch. This is common and not a problem. If you need a tetanus shot and you choose not to have one, there is a rare chance of getting tetanus. Sickness from tetanus can be serious.  HOME CARE INSTRUCTIONS   If a dressing was applied, change it at least once a day or as directed by your caregiver. If the bandage sticks, soak it off with warm water.   Wash the area with water and a mild soap to remove all the ointment 2 times a day. Rinse off the soap and pat the area dry with a clean towel.   Reapply any ointment as directed by your caregiver. This will help prevent infection and keep the bandage from sticking. Use gauze over the wound and under the dressing to help keep the bandage from sticking.   Change your dressing right away if it becomes wet or dirty.   Only take over-the-counter or prescription medicines for pain, discomfort, or fever as  directed by your caregiver.   Follow up with your caregiver within 24-48 hours for a wound check, or as directed. If you were not given a wound-check appointment, look closely at your abrasion for redness, swelling, or pus. These are signs of infection. SEEK IMMEDIATE MEDICAL CARE IF:   You have increasing pain in the wound.   You have redness, swelling, or tenderness around the wound.   You have pus coming from the wound.   You have a fever or persistent symptoms for more than 2-3 days.  You have a fever and your symptoms suddenly get worse.  You have a bad smell coming from the wound or dressing.  MAKE SURE YOU:   Understand these instructions.  Will watch your condition.  Will get help right away if you are not doing well or get worse. Document Released: 03/28/2005 Document Revised: 06/04/2012 Document Reviewed: 05/22/2011 [facility] Patient Information 2015 [place], [place]. This information is not intended to replace advice given to you by your health care provider. Make sure you discuss any questions you have with your health care provider.  Colles Fracture A Colles fracture is a type of broken wrist. It means the radius bone is broken (fractured).  The radius is the bone of your forearm on the thumb side. The forearm is the part of your arm between the elbow and your  wrist. Your forearm is made up of two bones. These are the radius and ulna. Often when the radius is broken, the ulna may also be broken. A cast or splint is used to protect and keep your injured bone from moving. The cast or splint will be on generally for about 5 to 6 weeks. SYMPTOMS  The usual problems are pain, swelling, and bruising. DIAGNOSIS  The diagnosis of this injury is usually made with X-rays. TREATMENT  Generally the fracture is held in place with a cast until it is healed. In a healthy person the casting lasts about 4 to 6 weeks but will depend on age and other factors. HOME CARE INSTRUCTIONS     Keep the injured part elevated while sitting or lying down. Keep the injury above the level of your heart (the center of the chest). This will decrease swelling and pain.  Apply ice to the injury for 15-20 minutes, 03-04 times per day while awake, for 2 days. Put the ice in a plastic bag and place a thin towel between the bag of ice and your cast or splint.  If you have a plaster or fiberglass cast:  Do not try to scratch the skin under the cast using sharp or pointed objects.  Check the skin around the cast every day. You may put lotion on any red or sore areas.  Keep your cast dry and clean.  If you have a plaster splint:  Wear the splint as directed.  You may loosen the elastic around the splint if your fingers become numb, tingle, or turn cold or blue.  Do not put pressure on any part of your cast or splint. It may break. Rest your cast only on a pillow the first 24 hours until it is fully hardened.  Your cast or splint can be protected during bathing with a plastic bag. Do not lower the cast or splint into water.  Only take over-the-counter or prescription medicines for pain, discomfort, or fever as directed by your caregiver. SEEK IMMEDIATE MEDICAL CARE IF:   Your cast gets damaged or breaks.  You have more severe pain or swelling than you did before the cast.  Your skin or nails below the injury turn blue or gray, or feel cold or numb.  There is a bad smell or new stains and/or pus like (purulent) drainage coming from under the cast. MAKE SURE YOU:   Understand these instructions.  Will watch your condition.  Will get help right away if you are not doing well or get worse. Document Released: 07/04/2006 Document Revised: 09/10/2011 Document Reviewed: 07/30/2006 [facility] Patient Information 2015 [place], [place]. This information is not intended to replace advice given to you by your health care provider. Make sure you discuss any questions you have with your health  care provider.  Hypokalemia Hypokalemia means that the amount of potassium in the blood is lower than normal.Potassium is a chemical, called an electrolyte, that helps regulate the amount of fluid in the body. It also stimulates muscle contraction and helps nerves function properly.Most of the body's potassium is inside of cells, and only a very small amount is in the blood. Because the amount in the blood is so small, minor changes can be life-threatening. CAUSES  Antibiotics.  Diarrhea or vomiting.  Using laxatives too much, which can cause diarrhea.  Chronic kidney disease.  Water pills (diuretics).  Eating disorders (bulimia).  Low magnesium level.  Sweating a lot. SIGNS AND SYMPTOMS  Weakness.  Constipation.  Fatigue.  Muscle cramps.  Mental confusion.  Skipped heartbeats or irregular heartbeat (palpitations).  Tingling or numbness. DIAGNOSIS  Your health care provider can diagnose hypokalemia with blood tests. In addition to checking your potassium level, your health care provider may also check other lab tests. TREATMENT Hypokalemia can be treated with potassium supplements taken by mouth or adjustments in your current medicines. If your potassium level is very low, you may need to get potassium through a vein (IV) and be monitored in the hospital. A diet high in potassium is also helpful. Foods high in potassium are:  Nuts, such as peanuts and pistachios.  Seeds, such as sunflower seeds and pumpkin seeds.  Peas, lentils, and lima beans.  Whole grain and bran cereals and breads.  Fresh fruit and vegetables, such as apricots, avocado, bananas, cantaloupe, kiwi, oranges, tomatoes, asparagus, and potatoes.  Orange and tomato juices.  Red meats.  Fruit yogurt. HOME CARE INSTRUCTIONS  Take all medicines as prescribed by your health care provider.  Maintain a healthy diet by including nutritious food, such as fruits, vegetables, nuts, whole grains, and  lean meats.  If you are taking a laxative, be sure to follow the directions on the label. SEEK MEDICAL CARE IF:  Your weakness gets worse.  You feel your heart pounding or racing.  You are vomiting or having diarrhea.  You are diabetic and having trouble keeping your blood glucose in the normal range. SEEK IMMEDIATE MEDICAL CARE IF:  You have chest pain, shortness of breath, or dizziness.  You are vomiting or having diarrhea for more than 2 days.  You faint. MAKE SURE YOU:   Understand these instructions.  Will watch your condition.  Will get help right away if you are not doing well or get worse. Document Released: 06/18/2005 Document Revised: 04/08/2013 Document Reviewed: 12/19/2012 [facility] Patient Information 2015 [place], [place]. This information is not intended to replace advice given to you by your health care provider. Make sure you discuss any questions you have with your health care provider.

## 2014-09-10 NOTE — ED Notes (Signed)
CBG 136; RN informed.

## 2014-09-10 NOTE — ED Notes (Signed)
Wounds dressed with silvadene and non-adherent dressings and gauze.  Pt reports relief of pain s/p splint application, +cms distal to injury noted

## 2014-09-10 NOTE — ED Notes (Signed)
Pt arrives from home via [facility]. Pt reports she tripped in the bathroom and fell face first. Pt has multiple skin tears, swelling/bruising/abrasion to nose, possible R wrist deformity and bruising to knees bilaterally. Pt is on coumadin, but bleeding was controlled upon EMS arrival. Pt has moist dressings over skin tears under dry dressings and a splint applied to her right wrist. Pt is diabetic and hasn't eaten or taken any medications today. CBG was 180 this morning per pt report. Pt is a&o x4 and has no c/o h/a, blurred/double vision or dizziness.

## 2014-09-10 NOTE — Pre-Procedure Instructions (Signed)
Karen Waters  09/10/2014   Your procedure is scheduled on: Monday, September 20, 2014  Report to [facility] Admitting at 5:30 AM.  Call this number if you have problems the morning of surgery: [phone]   Remember:   Do not eat food or drink liquids after midnight Sunday, September 19, 2014   Take these medicines the morning of surgery with A SIP OF WATER: DEXILANT, if needed: fluticasone (FLONASE) nasal spray for allergies  DO NOT TAKE ANY DIABETIC MEDICATION THE MORNING OF PROCEDURE.  Stop taking Aspirin, Coumadin, vitamins, and herbal medications. Do not take any NSAIDs ie: Ibuprofen, Advil, Naproxen or any medication containing Aspirin; stop 5 days prior to procedure Wednesday, September 15, 2014)   Do not wear jewelry, make-up or nail polish.  Do not wear lotions, powders, or perfumes. You may not wear deodorant.  Do not shave 48 hours prior to surgery.   Do not bring valuables to the hospital.  [facility] is not responsible for any belongings or valuables.               Contacts, dentures or bridgework may not be worn into surgery.  Leave suitcase in the car. After surgery it may be brought to your room.  For patients admitted to the hospital, discharge time is determined by your treatment team.               Patients discharged the day of surgery will not be allowed to drive home.  Name and phone number of your driver:   Special Instructions:  Special Instructions:Special Instructions: [facility] - Preparing for Surgery  Before surgery, you can play an important role.  Because skin is not sterile, your skin needs to be as free of germs as possible.  You can reduce the number of germs on you skin by washing with CHG (chlorahexidine gluconate) soap before surgery.  CHG is an antiseptic cleaner which kills germs and bonds with the skin to continue killing germs even after washing.  Please DO NOT use if you have an allergy to CHG or antibacterial soaps.  If your skin  becomes reddened/irritated stop using the CHG and inform your nurse when you arrive at Short Stay.  Do not shave (including legs and underarms) for at least 48 hours prior to the first CHG shower.  You may shave your face.  Please follow these instructions carefully:   1.  Shower with CHG Soap the night before surgery and the morning of Surgery.  2.  If you choose to wash your hair, wash your hair first as usual with your normal shampoo.  3.  After you shampoo, rinse your hair and body thoroughly to remove the Shampoo.  4.  Use CHG as you would any other liquid soap.  You can apply chg directly  to the skin and wash gently with scrungie or a clean washcloth.  5.  Apply the CHG Soap to your body ONLY FROM THE NECK DOWN.  Do not use on open wounds or open sores.  Avoid contact with your eyes, ears, mouth and genitals (private parts).  Wash genitals (private parts) with your normal soap.  6.  Wash thoroughly, paying special attention to the area where your surgery will be performed.  7.  Thoroughly rinse your body with warm water from the neck down.  8.  DO NOT shower/wash with your normal soap after using and rinsing off the CHG Soap.  9.  Pat yourself dry with a  clean towel.            10.  Wear clean pajamas.            11.  Place clean sheets on your bed the night of your first shower and do not sleep with pets.  Day of Surgery  Do not apply any lotions/deoderants the morning of surgery.  Please wear clean clothes to the hospital/surgery center.   Please read over the following fact sheets that you were given: Pain Booklet, Coughing and Deep Breathing and Surgical Site Infection Prevention

## 2014-09-10 NOTE — Progress Notes (Signed)
Orthopedic Tech Progress Note Patient Details:  Karen Waters 1951-05-19 [id number]  Ortho Devices Type of Ortho Device: Ace wrap, Arm sling, Sugartong splint Ortho Device/Splint Location: rue Ortho Device/Splint Interventions: Application   [name] 09/10/2014, 1:23 PM

## 2014-09-13 ENCOUNTER — Inpatient Hospital Stay (HOSPITAL_COMMUNITY)
Admission: RE | Admit: 2014-09-13 | Discharge: 2014-09-13 | Disposition: A | Discharge status: Home / Self Care | Payer: Medicare Other | Source: Ambulatory Visit

## 2014-09-13 NOTE — Progress Notes (Signed)
Spoke with patient and pt daughter, Bearl Mulberry, regarding missed PAT appointment. According to daughter, pt will call physician to reschedule surgery because pt fell and fractured her arm and nose on Friday. A voice message was left on Sherry's ( surgical scheduler) voicemail to make Dr. Zella Richer aware of cancellation. Pt instructed to call office as well.

## 2014-09-20 ENCOUNTER — Ambulatory Visit (HOSPITAL_COMMUNITY): Admission: RE | Admit: 2014-09-20 | Payer: Medicare Other | Source: Ambulatory Visit | Admitting: General Surgery

## 2014-09-20 ENCOUNTER — Encounter (HOSPITAL_COMMUNITY): Admission: RE | Payer: Self-pay | Source: Ambulatory Visit

## 2014-09-20 ENCOUNTER — Other Ambulatory Visit: Payer: Medicare Other

## 2014-09-20 SURGERY — BIOPSY TEMPORAL ARTERY
Anesthesia: Choice | Laterality: Left

## 2014-09-21 ENCOUNTER — Ambulatory Visit: Payer: Self-pay | Admitting: Neurology

## 2014-09-22 ENCOUNTER — Other Ambulatory Visit: Payer: Medicare Other

## 2014-09-23 ENCOUNTER — Telehealth: Payer: Self-pay | Admitting: Internal Medicine

## 2014-09-23 NOTE — Telephone Encounter (Signed)
Noted  

## 2014-09-27 ENCOUNTER — Ambulatory Visit: Payer: Medicare Other | Admitting: Podiatry

## 2014-09-27 ENCOUNTER — Other Ambulatory Visit: Payer: Medicare Other

## 2014-10-18 ENCOUNTER — Ambulatory Visit (INDEPENDENT_AMBULATORY_CARE_PROVIDER_SITE_OTHER): Payer: Medicare Other | Admitting: Podiatry

## 2014-10-18 ENCOUNTER — Encounter: Payer: Self-pay | Admitting: Podiatry

## 2014-10-18 DIAGNOSIS — B351 Tinea unguium: Secondary | ICD-10-CM | POA: Diagnosis not present

## 2014-10-18 DIAGNOSIS — M79676 Pain in unspecified toe(s): Secondary | ICD-10-CM | POA: Diagnosis not present

## 2014-10-18 NOTE — Progress Notes (Signed)
   Subjective:    Patient ID: Karen Waters, female    DOB: Mar 24, 1951, 64 y.o.   MRN: [id number]  HPI  Patient presents today complaining of painful toenails and requests toenail debridement  Review of Systems  All other systems reviewed and are negative.      Objective:   Physical Exam  Patient transfers from wheelchair to treatment chair Patient's daughter present in treatment room today  The toenails are elongated, hypertrophic, discolored, incurvated and tender to direct palpation      Assessment & Plan:   Assessment: Symptomatic onychomycoses 6-10  Plan: Debridement of toenails 10 without any bleeding  Reappoint 3 months

## 2014-10-18 NOTE — Patient Instructions (Signed)
Diabetes and Foot Care Diabetes may cause you to have problems because of poor blood supply (circulation) to your feet and legs. This may cause the skin on your feet to become thinner, break easier, and heal more slowly. Your skin may become dry, and the skin may peel and crack. You may also have nerve damage in your legs and feet causing decreased feeling in them. You may not notice minor injuries to your feet that could lead to infections or more serious problems. Taking care of your feet is one of the most important things you can do for yourself.  HOME CARE INSTRUCTIONS  Wear shoes at all times, even in the house. Do not go barefoot. Bare feet are easily injured.  Check your feet daily for blisters, cuts, and redness. If you cannot see the bottom of your feet, use a mirror or ask someone for help.  Wash your feet with warm water (do not use hot water) and mild soap. Then pat your feet and the areas between your toes until they are completely dry. Do not soak your feet as this can dry your skin.  Apply a moisturizing lotion or petroleum jelly (that does not contain alcohol and is unscented) to the skin on your feet and to dry, brittle toenails. Do not apply lotion between your toes.  Trim your toenails straight across. Do not dig under them or around the cuticle. File the edges of your nails with an emery board or nail file.  Do not cut corns or calluses or try to remove them with medicine.  Wear clean socks or stockings every day. Make sure they are not too tight. Do not wear knee-high stockings since they may decrease blood flow to your legs.  Wear shoes that fit properly and have enough cushioning. To break in new shoes, wear them for just a few hours a day. This prevents you from injuring your feet. Always look in your shoes before you put them on to be sure there are no objects inside.  Do not cross your legs. This may decrease the blood flow to your feet.  If you find a minor scrape,  cut, or break in the skin on your feet, keep it and the skin around it clean and dry. These areas may be cleansed with mild soap and water. Do not cleanse the area with peroxide, alcohol, or iodine.  When you remove an adhesive bandage, be sure not to damage the skin around it.  If you have a wound, look at it several times a day to make sure it is healing.  Do not use heating pads or hot water bottles. They may burn your skin. If you have lost feeling in your feet or legs, you may not know it is happening until it is too late.  Make sure your health care provider performs a complete foot exam at least annually or more often if you have foot problems. Report any cuts, sores, or bruises to your health care provider immediately. SEEK MEDICAL CARE IF:   You have an injury that is not healing.  You have cuts or breaks in the skin.  You have an ingrown nail.  You notice redness on your legs or feet.  You feel burning or tingling in your legs or feet.  You have pain or cramps in your legs and feet.  Your legs or feet are numb.  Your feet always feel cold. SEEK IMMEDIATE MEDICAL CARE IF:   There is increasing redness,   swelling, or pain in or around a wound.  There is a red line that goes up your leg.  Pus is coming from a wound.  You develop a fever or as directed by your health care provider.  You notice a bad smell coming from an ulcer or wound. Document Released: 06/15/2000 Document Revised: 02/18/2013 Document Reviewed: 11/25/2012 [employer] Patient Information 2015 [employer]. This information is not intended to replace advice given to you by your health care provider. Make sure you discuss any questions you have with your health care provider.  

## 2014-11-03 ENCOUNTER — Emergency Department (HOSPITAL_COMMUNITY)
Admission: EM | Admit: 2014-11-03 | Discharge: 2014-11-04 | Disposition: A | Discharge status: Home / Self Care | Payer: Medicare Other | Attending: Emergency Medicine | Admitting: Emergency Medicine

## 2014-11-03 ENCOUNTER — Encounter (HOSPITAL_COMMUNITY): Payer: Self-pay | Admitting: Emergency Medicine

## 2014-11-03 ENCOUNTER — Emergency Department (HOSPITAL_COMMUNITY): Payer: Medicare Other

## 2014-11-03 DIAGNOSIS — Z79899 Other long term (current) drug therapy: Secondary | ICD-10-CM | POA: Diagnosis not present

## 2014-11-03 DIAGNOSIS — X58XXXA Exposure to other specified factors, initial encounter: Secondary | ICD-10-CM | POA: Insufficient documentation

## 2014-11-03 DIAGNOSIS — E669 Obesity, unspecified: Secondary | ICD-10-CM | POA: Insufficient documentation

## 2014-11-03 DIAGNOSIS — Z8542 Personal history of malignant neoplasm of other parts of uterus: Secondary | ICD-10-CM | POA: Diagnosis not present

## 2014-11-03 DIAGNOSIS — S22080A Wedge compression fracture of T11-T12 vertebra, initial encounter for closed fracture: Secondary | ICD-10-CM

## 2014-11-03 DIAGNOSIS — Y9301 Activity, walking, marching and hiking: Secondary | ICD-10-CM | POA: Diagnosis not present

## 2014-11-03 DIAGNOSIS — Z86718 Personal history of other venous thrombosis and embolism: Secondary | ICD-10-CM | POA: Insufficient documentation

## 2014-11-03 DIAGNOSIS — Y998 Other external cause status: Secondary | ICD-10-CM | POA: Diagnosis not present

## 2014-11-03 DIAGNOSIS — Z720 Tobacco use: Secondary | ICD-10-CM | POA: Diagnosis not present

## 2014-11-03 DIAGNOSIS — E119 Type 2 diabetes mellitus without complications: Secondary | ICD-10-CM | POA: Diagnosis not present

## 2014-11-03 DIAGNOSIS — Z8679 Personal history of other diseases of the circulatory system: Secondary | ICD-10-CM | POA: Insufficient documentation

## 2014-11-03 DIAGNOSIS — Y9289 Other specified places as the place of occurrence of the external cause: Secondary | ICD-10-CM | POA: Insufficient documentation

## 2014-11-03 DIAGNOSIS — M1990 Unspecified osteoarthritis, unspecified site: Secondary | ICD-10-CM | POA: Insufficient documentation

## 2014-11-03 DIAGNOSIS — S3992XA Unspecified injury of lower back, initial encounter: Secondary | ICD-10-CM | POA: Diagnosis present

## 2014-11-03 DIAGNOSIS — Z794 Long term (current) use of insulin: Secondary | ICD-10-CM | POA: Insufficient documentation

## 2014-11-03 MED ORDER — OXYCODONE-ACETAMINOPHEN 5-325 MG PO TABS
1.0000 | ORAL_TABLET | Freq: Once | ORAL | Status: DC
  Filled 2014-11-03: qty 1

## 2014-11-03 MED ORDER — HYDROCODONE-ACETAMINOPHEN 5-325 MG PO TABS
1.0000 | ORAL_TABLET | Freq: Once | ORAL | Status: AC
  Administered 2014-11-03: 1 via ORAL
  Filled 2014-11-03: qty 1

## 2014-11-03 NOTE — ED Notes (Signed)
Patient daughter states the patient was ambulatory this am. Patient has not been able to walk today.

## 2014-11-03 NOTE — ED Notes (Signed)
Patient transported to X-ray 

## 2014-11-03 NOTE — ED Notes (Signed)
Per EMS, pt from home, reports bila low back pain-denies injury.  Pain is worse with movement.

## 2014-11-03 NOTE — ED Notes (Signed)
Patient daughter updated on patient dispo and follow up.

## 2014-11-03 NOTE — ED Notes (Signed)
Patient initially refused XR stating she is unable to lay flat. XR tech and RN spoke to MD. MD states patient must be able to lay to obtain images. RN spoke to patient. Patient is willing to attempt laying flat for imaging. Family at bedside encouraging patient to attempt imaging. XR notified.

## 2014-11-03 NOTE — Discharge Instructions (Signed)
Get plenty of rest. Use your walker every time you get up. Make sure you have a bowel movement every day, and use Colace twice a day if needed to help that. Follow-up with your orthopedist within 1 week.   Vertebral Fracture You have a fracture of one or more vertebra. These are the bony parts that form the spine. Minor vertebral fractures happen when people fall. Osteoporosis is associated with many of these fractures. Hospital care may not be necessary for minor compression fractures that are stable. However, multiple fractures of the spine or unstable injuries can cause severe pain and even damage the spinal cord. A spinal cord injury may cause paralysis, numbness, or loss of normal bowel and bladder control.  Normally there is pain and stiffness in the back for 3 to 6 weeks after a vertebral fracture. Bed rest for several days, pain medicine, and a slow return to activity is often the only treatment that is needed depending on the location of the fracture. Neck and back braces may be helpful in reducing pain and increasing mobility. When your pain allows, you should begin walking or swimming to help maintain your endurance. Exercises to improve motion and to strengthen the back may also be useful after the initial pain improves. Treatment for osteoporosis may be essential for full recovery. This will help reduce your risk of vertebral fractures with a future fall. During the first few days after a spine fracture you may feel nauseated or vomit. If this is severe, hospital care with IV fluids will be needed.  Arrange for follow-up care as recommended to assure proper long-term care and prevention of further spine injury.  SEEK IMMEDIATE MEDICAL CARE IF:  You have increasing pain, vomiting, or are unable to move around at all.  You develop numbness, tingling, weakness, or paralysis of any part of your body.  You develop a loss of normal bowel or bladder control.  You have difficulty breathing,  cough, fever, chest or abdominal pain. MAKE SURE YOU:   Understand these instructions.  Will watch your condition.  Will get help right away if you are not doing well or get worse. Document Released: 07/26/2004 Document Revised: 09/10/2011 Document Reviewed: 02/08/2009 [facility] Patient Information 2015 [place], [place]. This information is not intended to replace advice given to you by your health care provider. Make sure you discuss any questions you have with your health care provider.

## 2014-11-03 NOTE — ED Notes (Signed)
Bed: BW46 Expected date:  Expected time:  Means of arrival:  Comments: EMS female/left and right flank pain after hearing a "pop" this AM when she got up

## 2014-11-03 NOTE — ED Notes (Signed)
Patient states she fell in March 2016 and injured her back, has since been told the injury has resolved. Patient states today she suddenly began having bilateral low back pain that is dull and squeezing. Patient with foley catheter in place. Patient denies any new falls or injuries.

## 2014-11-03 NOTE — ED Notes (Signed)
Karen Waters [phone] Password: "[email or web address]"

## 2014-11-03 NOTE — ED Notes (Signed)
MD at bedside. 

## 2014-11-03 NOTE — ED Provider Notes (Signed)
CSN: [id number]     Arrival date & time 11/03/14  1910 History   First MD Initiated Contact with Patient 11/03/14 1945     Chief Complaint  Patient presents with  . Back Pain     (Consider location/radiation/quality/duration/timing/severity/associated sxs/prior Treatment) HPI   Karen Waters is a 64 y.o. female who reports that she had onset of low back pain, this morning. This occurred while she was walking with her walker and she felt a pop in her lower back. Since then she has had persistent bilateral lower lumbar pain. There is no radiation to the upper back, or the legs. There's been no loss of bowel or urinary incontinence, today. She had a back injury and fall about 2 months ago but that improved. She is following closely with orthopedics, for a right wrist fracture. She denies fever, chills, nausea, vomiting, weakness or dizziness. She's been using Norco at home without relief of her pain. There are no other known modifying factors.   Past Medical History  Diagnosis Date  . Uterine cancer   . DVT (deep venous thrombosis)   . Lymphedema of both lower extremities   . Obesity   . Tobacco abuse   . Arthritis   . Diabetes mellitus without complication   . Fractures     compression (2)   Past Surgical History  Procedure Laterality Date  . Tubal ligation    . Abdominal hysterectomy    . Back surgery    . Cystocele repair     Family History  Problem Relation Age of Onset  . Hypertension     History  Substance Use Topics  . Smoking status: Current Every Day Smoker -- 0.75 packs/day for 47 years    Types: Cigarettes  . Smokeless tobacco: Never Used  . Alcohol Use: No   OB History    No data available     Review of Systems  All other systems reviewed and are negative.     Allergies  Darvon; Percocet; and Valium  Home Medications   Prior to Admission medications   Medication Sig Start Date End Date Taking? Authorizing Provider  ACCU-CHEK AVIVA PLUS test  strip  09/08/14  Yes Historical Provider, MD  atorvastatin (LIPITOR) 20 MG tablet Take 10 mg by mouth daily at 6 PM.  11/11/13  Yes Historical Provider, MD  B Complex Vitamins (B-COMPLEX/B-12 PO) Take 1 tablet by mouth daily.   Yes Historical Provider, MD  DEXILANT 60 MG capsule Take 60 mg by mouth daily.  11/11/13  Yes Historical Provider, MD  fluticasone (FLONASE) 50 MCG/ACT nasal spray Place 2 sprays into both nostrils daily as needed for allergies (allergies).  11/11/13  Yes Historical Provider, MD  furosemide (LASIX) 40 MG tablet Take 60 mg by mouth daily.  10/13/14  Yes Historical Provider, MD  HYDROcodone-acetaminophen (NORCO/VICODIN) 5-325 MG per tablet Take 1 tablet by mouth every 6 (six) hours as needed for severe pain (pain).  10/19/14  Yes Historical Provider, MD  Insulin Glargine (LANTUS SOLOSTAR) 100 UNIT/ML Solostar Pen Inject 10 Units into the skin daily at 10 pm. Patient taking differently: Inject 16 Units into the skin daily at 10 pm.  06/15/14  Yes Tasrif Ahmed, MD  Insulin Pen Needle 29G X 12.7MM MISC Use with lantus solostar pen. Inject 10 unit daily lantus. 06/15/14  Yes Tasrif Ahmed, MD  montelukast (SINGULAIR) 10 MG tablet Take 10 mg by mouth at bedtime.  11/11/13  Yes Historical Provider, MD  predniSONE (DELTASONE) 20 MG  tablet Take 20 mg by mouth 3 (three) times daily.  10/13/14  Yes Historical Provider, MD  Vitamin D, Ergocalciferol, (DRISDOL) 50000 UNITS CAPS capsule Take 1 capsule (50,000 Units total) by mouth every 7 (seven) days. 06/15/14  Yes Tasrif Ahmed, MD  warfarin (COUMADIN) 4 MG tablet Take 4 mg by mouth daily.   Yes Historical Provider, MD  furosemide (LASIX) 20 MG tablet Take 2 tablets (40 mg total) by mouth daily. Patient not taking: Reported on 11/03/2014 06/21/14   Ejiroghene Arlyce Dice, MD  predniSONE (DELTASONE) 10 MG tablet Take 5 tablets (50 mg total) by mouth daily with breakfast. Patient not taking: Reported on 11/03/2014 06/16/14   Tasrif Ahmed, MD   BP 155/80  mmHg  Pulse 96  Temp(Src) 98 F (36.7 C)  Resp 19  SpO2 98% Physical Exam  Constitutional: She is oriented to person, place, and time. She appears well-developed.  She appears older than stated age. She is obese.  HENT:  Head: Normocephalic and atraumatic.  Right Ear: External ear normal.  Left Ear: External ear normal.  Eyes: Conjunctivae and EOM are normal. Pupils are equal, round, and reactive to light.  Neck: Normal range of motion and phonation normal. Neck supple.  Cardiovascular: Normal rate, regular rhythm and normal heart sounds.   Pulmonary/Chest: Effort normal and breath sounds normal. She exhibits no bony tenderness.  Abdominal: Soft. There is no tenderness.  Musculoskeletal: Normal range of motion. She exhibits edema. She exhibits no tenderness.  Fair range of motion lumbar area. No palpable tenderness of the lumbar spine. Right wrist is in a removable Velcro splint.  Neurological: She is alert and oriented to person, place, and time. No cranial nerve deficit or sensory deficit. She exhibits normal muscle tone. Coordination normal.  Skin: Skin is warm, dry and intact.  Psychiatric: She has a normal mood and affect. Her behavior is normal. Judgment and thought content normal.  Nursing note and vitals reviewed.   ED Course  Procedures (including critical care time) Medications  oxyCODONE-acetaminophen (PERCOCET/ROXICET) 5-325 MG per tablet 1 tablet (not administered)    Patient Vitals for the past 24 hrs:  BP Temp Pulse Resp SpO2  11/03/14 1914 155/80 mmHg 98 F (36.7 C) 96 19 98 %    At D/C Reevaluation with update and discussion. After initial assessment and treatment, an updated evaluation reveals she remains comfortable. Findings discussed with patient, all questions answered.. Parchment Review Labs Reviewed - No data to display  Imaging Review Dg Lumbar Spine Complete  11/03/2014   CLINICAL DATA:  Increasing pain all day in low back,  radiating to both hips.  EXAM: LUMBAR SPINE - COMPLETE 4+ VIEW  COMPARISON:  None.  FINDINGS: There is moderate anterior compression of T11 with about 50% loss of height. This is new from the CT of 06/12/2014 and a could be recent. The lumbar vertebrae are normal in height except for mild superior endplate impaction at L4, indeterminate age. No bone lesion or bony destruction is evident. Sclerotic degenerative facet changes are present at L4-5 and L5-S1. There is good preservation of the intervertebral discs.  Incidental note is made of an IVC filter.  IMPRESSION: Moderate anterior compression of T11, new from 06/12/2014 and possibly recent. Lower lumbar degenerative facet changes. Mild superior endplate impaction at L4, age indeterminate.   Electronically Signed   By: Andreas Newport M.D.   On: 11/03/2014 22:06     EKG Interpretation None  MDM   Final diagnoses:  Traumatic compression fracture of T11 thoracic vertebra, closed, initial encounter    Spontaneous compression fracture while walking. Doubt spinal myelopathy.  Nursing Notes Reviewed/ Care Coordinated Applicable Imaging Reviewed Interpretation of Laboratory Data incorporated into ED treatment  The patient appears reasonably screened and/or stabilized for discharge and I doubt any other medical condition or other [facility] requiring further screening, evaluation, or treatment in the ED at this time prior to discharge.  Plan: Home Medications- Norco; Home Treatments- rest, use walker; return here if the recommended treatment, does not improve the symptoms; Recommended follow up- Ortho 1 week      Daleen Bo, MD 11/04/14 1344

## 2014-11-03 NOTE — ED Notes (Signed)
[facility] requested for patient transfer

## 2014-11-17 ENCOUNTER — Emergency Department (HOSPITAL_COMMUNITY): Payer: Medicare Other

## 2014-11-17 ENCOUNTER — Emergency Department (HOSPITAL_COMMUNITY)
Admission: EM | Admit: 2014-11-17 | Discharge: 2014-11-17 | Disposition: A | Discharge status: Home / Self Care | Payer: Medicare Other | Attending: Emergency Medicine | Admitting: Emergency Medicine

## 2014-11-17 ENCOUNTER — Encounter (HOSPITAL_COMMUNITY): Payer: Self-pay | Admitting: *Deleted

## 2014-11-17 DIAGNOSIS — E119 Type 2 diabetes mellitus without complications: Secondary | ICD-10-CM | POA: Insufficient documentation

## 2014-11-17 DIAGNOSIS — Z9889 Other specified postprocedural states: Secondary | ICD-10-CM | POA: Diagnosis not present

## 2014-11-17 DIAGNOSIS — M546 Pain in thoracic spine: Secondary | ICD-10-CM | POA: Diagnosis not present

## 2014-11-17 DIAGNOSIS — Z7952 Long term (current) use of systemic steroids: Secondary | ICD-10-CM | POA: Diagnosis not present

## 2014-11-17 DIAGNOSIS — Z720 Tobacco use: Secondary | ICD-10-CM | POA: Diagnosis not present

## 2014-11-17 DIAGNOSIS — E669 Obesity, unspecified: Secondary | ICD-10-CM | POA: Insufficient documentation

## 2014-11-17 DIAGNOSIS — M545 Low back pain: Secondary | ICD-10-CM | POA: Insufficient documentation

## 2014-11-17 DIAGNOSIS — Z8781 Personal history of (healed) traumatic fracture: Secondary | ICD-10-CM | POA: Diagnosis not present

## 2014-11-17 DIAGNOSIS — Z7901 Long term (current) use of anticoagulants: Secondary | ICD-10-CM | POA: Insufficient documentation

## 2014-11-17 DIAGNOSIS — Z794 Long term (current) use of insulin: Secondary | ICD-10-CM | POA: Diagnosis not present

## 2014-11-17 DIAGNOSIS — Z8541 Personal history of malignant neoplasm of cervix uteri: Secondary | ICD-10-CM | POA: Insufficient documentation

## 2014-11-17 DIAGNOSIS — Z79899 Other long term (current) drug therapy: Secondary | ICD-10-CM | POA: Insufficient documentation

## 2014-11-17 DIAGNOSIS — Z7951 Long term (current) use of inhaled steroids: Secondary | ICD-10-CM | POA: Diagnosis not present

## 2014-11-17 DIAGNOSIS — M549 Dorsalgia, unspecified: Secondary | ICD-10-CM

## 2014-11-17 DIAGNOSIS — Z86718 Personal history of other venous thrombosis and embolism: Secondary | ICD-10-CM | POA: Diagnosis not present

## 2014-11-17 LAB — COMPREHENSIVE METABOLIC PANEL
ALK PHOS: 170 U/L — AB (ref 38–126)
ALT: 11 U/L — AB (ref 14–54)
AST: 13 U/L — AB (ref 15–41)
Albumin: 3 g/dL — ABNORMAL LOW (ref 3.5–5.0)
Anion gap: 10 (ref 5–15)
BILIRUBIN TOTAL: 0.8 mg/dL (ref 0.3–1.2)
BUN: 17 mg/dL (ref 6–20)
CALCIUM: 8.9 mg/dL (ref 8.9–10.3)
CHLORIDE: 103 mmol/L (ref 101–111)
CO2: 29 mmol/L (ref 22–32)
Creatinine, Ser: 0.82 mg/dL (ref 0.44–1.00)
GFR calc Af Amer: >60 mL/min (ref >60)
Glucose, Bld: 135 mg/dL — ABNORMAL HIGH (ref 65–99)
Potassium: 3.2 mmol/L — ABNORMAL LOW (ref 3.5–5.1)
SODIUM: 142 mmol/L (ref 135–145)
Total Protein: 5.9 g/dL — ABNORMAL LOW (ref 6.5–8.1)

## 2014-11-17 LAB — URINALYSIS, ROUTINE W REFLEX MICROSCOPIC
Glucose, UA: NEGATIVE mg/dL
KETONES UR: NEGATIVE mg/dL
Nitrite: NEGATIVE
PH: 6 (ref 5.0–8.0)
Protein, ur: NEGATIVE mg/dL
Specific Gravity, Urine: 1.018 (ref 1.005–1.030)
UROBILINOGEN UA: 1 mg/dL (ref 0.0–1.0)

## 2014-11-17 LAB — URINE MICROSCOPIC-ADD ON

## 2014-11-17 LAB — CBC WITH DIFFERENTIAL/PLATELET
BASOS ABS: 0 10*3/uL (ref 0.0–0.1)
BASOS PCT: 0 % (ref 0–1)
EOS ABS: 0.1 10*3/uL (ref 0.0–0.7)
Eosinophils Relative: 1 % (ref 0–5)
HCT: 47.3 % — ABNORMAL HIGH (ref 36.0–46.0)
Hemoglobin: 14.9 g/dL (ref 12.0–15.0)
Lymphocytes Relative: 19 % (ref 12–46)
Lymphs Abs: 2 10*3/uL (ref 0.7–4.0)
MCH: 28.4 pg (ref 26.0–34.0)
MCHC: 31.5 g/dL (ref 30.0–36.0)
MCV: 90.1 fL (ref 78.0–100.0)
Monocytes Absolute: 0.9 10*3/uL (ref 0.1–1.0)
Monocytes Relative: 8 % (ref 3–12)
Neutro Abs: 7.5 10*3/uL (ref 1.7–7.7)
Neutrophils Relative %: 72 % (ref 43–77)
Platelets: 285 10*3/uL (ref 150–400)
RBC: 5.25 MIL/uL — ABNORMAL HIGH (ref 3.87–5.11)
RDW: 15.5 % (ref 11.5–15.5)
WBC: 10.5 10*3/uL (ref 4.0–10.5)

## 2014-11-17 MED ORDER — HYDROCODONE-ACETAMINOPHEN 5-325 MG PO TABS: 1.0000 | ORAL_TABLET | ORAL | Status: DC | PRN

## 2014-11-17 MED ORDER — KETOROLAC TROMETHAMINE 30 MG/ML IJ SOLN
30.0000 mg | Freq: Once | INTRAMUSCULAR | Status: AC
  Administered 2014-11-17: 30 mg via INTRAVENOUS
  Filled 2014-11-17: qty 1

## 2014-11-17 NOTE — ED Notes (Signed)
Pt nauseated at this time.

## 2014-11-17 NOTE — ED Notes (Signed)
Patient in xray 

## 2014-11-17 NOTE — ED Notes (Signed)
Patient has been made aware that a urine sample is needed but denies having to go at this time, will let RN Charlann Boxer know.

## 2014-11-17 NOTE — ED Notes (Signed)
Bed: WA07 Expected date:  Expected time:  Means of arrival:  Comments: Ems

## 2014-11-17 NOTE — ED Notes (Signed)
Patient transported to CT 

## 2014-11-17 NOTE — ED Notes (Signed)
[facility] called for transport.  

## 2014-11-17 NOTE — Discharge Instructions (Signed)
Sciatica Sciatica is pain, weakness, numbness, or tingling along the path of the sciatic nerve. The nerve starts in the lower back and runs down the back of each leg. The nerve controls the muscles in the lower leg and in the back of the knee, while also providing sensation to the back of the thigh, lower leg, and the sole of your foot. Sciatica is a symptom of another medical condition. For instance, nerve damage or certain conditions, such as a herniated disk or bone spur on the spine, pinch or put pressure on the sciatic nerve. This causes the pain, weakness, or other sensations normally associated with sciatica. Generally, sciatica only affects one side of the body. CAUSES   Herniated or slipped disc.  Degenerative disk disease.  A pain disorder involving the narrow muscle in the buttocks (piriformis syndrome).  Pelvic injury or fracture.  Pregnancy.  Tumor (rare). SYMPTOMS  Symptoms can vary from mild to very severe. The symptoms usually travel from the low back to the buttocks and down the back of the leg. Symptoms can include:  Mild tingling or dull aches in the lower back, leg, or hip.  Numbness in the back of the calf or sole of the foot.  Burning sensations in the lower back, leg, or hip.  Sharp pains in the lower back, leg, or hip.  Leg weakness.  Severe back pain inhibiting movement. These symptoms may get worse with coughing, sneezing, laughing, or prolonged sitting or standing. Also, being overweight may worsen symptoms. DIAGNOSIS  Your caregiver will perform a physical exam to look for common symptoms of sciatica. He or she may ask you to do certain movements or activities that would trigger sciatic nerve pain. Other tests may be performed to find the cause of the sciatica. These may include:  Blood tests.  X-rays.  Imaging tests, such as an MRI or CT scan. TREATMENT  Treatment is directed at the cause of the sciatic pain. Sometimes, treatment is not necessary  and the pain and discomfort goes away on its own. If treatment is needed, your caregiver may suggest:  Over-the-counter medicines to relieve pain.  Prescription medicines, such as anti-inflammatory medicine, muscle relaxants, or narcotics.  Applying heat or ice to the painful area.  Steroid injections to lessen pain, irritation, and inflammation around the nerve.  Reducing activity during periods of pain.  Exercising and stretching to strengthen your abdomen and improve flexibility of your spine. Your caregiver may suggest losing weight if the extra weight makes the back pain worse.  Physical therapy.  Surgery to eliminate what is pressing or pinching the nerve, such as a bone spur or part of a herniated disk. HOME CARE INSTRUCTIONS   Only take over-the-counter or prescription medicines for pain or discomfort as directed by your caregiver.  Apply ice to the affected area for 20 minutes, 3-4 times a day for the first 48-72 hours. Then try heat in the same way.  Exercise, stretch, or perform your usual activities if these do not aggravate your pain.  Attend physical therapy sessions as directed by your caregiver.  Keep all follow-up appointments as directed by your caregiver.  Do not wear high heels or shoes that do not provide proper support.  Check your mattress to see if it is too soft. A firm mattress may lessen your pain and discomfort. SEEK IMMEDIATE MEDICAL CARE IF:   You lose control of your bowel or bladder (incontinence).  You have increasing weakness in the lower back, pelvis, buttocks,   or legs.  You have redness or swelling of your back.  You have a burning sensation when you urinate.  You have pain that gets worse when you lie down or awakens you at night.  Your pain is worse than you have experienced in the past.  Your pain is lasting longer than 4 weeks.  You are suddenly losing weight without reason. MAKE SURE YOU:  Understand these  instructions.  Will watch your condition.  Will get help right away if you are not doing well or get worse. Document Released: 06/12/2001 Document Revised: 12/18/2011 Document Reviewed: 10/28/2011 [employer] Patient Information 2015 [employer]. This information is not intended to replace advice given to you by your health care provider. Make sure you discuss any questions you have with your health care provider.  

## 2014-11-17 NOTE — ED Provider Notes (Signed)
CSN: [id number]     Arrival date & time 11/17/14  1142 History   First MD Initiated Contact with Patient 11/17/14 1204     Chief Complaint  Patient presents with  . Back Pain     (Consider location/radiation/quality/duration/timing/severity/associated sxs/prior Treatment) Patient is a 64 y.o. female presenting with back pain.  Back Pain Location:  Lumbar spine Quality:  Aching and stabbing Radiates to:  Does not radiate Pain severity:  Moderate Onset quality:  Gradual Duration:  3 hours Timing:  Constant Progression:  Unchanged Chronicity:  Recurrent Context comment:  Standing up Relieved by:  Nothing Worsened by:  Movement and palpation Associated symptoms: no bladder incontinence, no bowel incontinence, no numbness, no perianal numbness and no tingling     Past Medical History  Diagnosis Date  . Uterine cancer   . DVT (deep venous thrombosis)   . Lymphedema of both lower extremities   . Obesity   . Tobacco abuse   . Arthritis   . Diabetes mellitus without complication   . Fractures     compression (2)   Past Surgical History  Procedure Laterality Date  . Tubal ligation    . Abdominal hysterectomy    . Back surgery    . Cystocele repair     Family History  Problem Relation Age of Onset  . Hypertension     History  Substance Use Topics  . Smoking status: Current Every Day Smoker -- 0.75 packs/day for 47 years    Types: Cigarettes  . Smokeless tobacco: Never Used  . Alcohol Use: No   OB History    No data available     Review of Systems  Gastrointestinal: Negative for bowel incontinence.  Genitourinary: Negative for bladder incontinence.  Musculoskeletal: Positive for back pain.  Neurological: Negative for tingling and numbness.  All other systems reviewed and are negative.     Allergies  Darvon; Percocet; and Valium  Home Medications   Prior to Admission medications   Medication Sig Start Date End Date Taking? Authorizing Provider   ACCU-CHEK AVIVA PLUS test strip 1 each by Other route as directed.  09/08/14  Yes Historical Provider, MD  atorvastatin (LIPITOR) 20 MG tablet Take 10 mg by mouth daily at 6 PM.  11/11/13  Yes Historical Provider, MD  B Complex Vitamins (B-COMPLEX/B-12 PO) Take 1 tablet by mouth daily.   Yes Historical Provider, MD  DEXILANT 60 MG capsule Take 60 mg by mouth daily.  11/11/13  Yes Historical Provider, MD  fluticasone (FLONASE) 50 MCG/ACT nasal spray Place 2 sprays into both nostrils daily as needed for allergies (allergies).  11/11/13  Yes Historical Provider, MD  furosemide (LASIX) 20 MG tablet Take 2 tablets (40 mg total) by mouth daily. 06/21/14  Yes Ejiroghene Arlyce Dice, MD  Insulin Glargine (LANTUS SOLOSTAR) 100 UNIT/ML Solostar Pen Inject 10 Units into the skin daily at 10 pm. Patient taking differently: Inject 16 Units into the skin daily at 10 pm.  06/15/14  Yes Tasrif Ahmed, MD  Insulin Pen Needle 29G X 12.7MM MISC Use with lantus solostar pen. Inject 10 unit daily lantus. 06/15/14  Yes Tasrif Ahmed, MD  montelukast (SINGULAIR) 10 MG tablet Take 10 mg by mouth at bedtime.  11/11/13  Yes Historical Provider, MD  predniSONE (DELTASONE) 20 MG tablet Take 1 tablet by mouth daily. 11/10/14  Yes Historical Provider, MD  Vitamin D, Ergocalciferol, (DRISDOL) 50000 UNITS CAPS capsule Take 1 capsule (50,000 Units total) by mouth every 7 (seven) days. 06/15/14  Yes Tasrif Ahmed, MD  warfarin (COUMADIN) 4 MG tablet Take 4 mg by mouth daily.   Yes Historical Provider, MD  HYDROcodone-acetaminophen (NORCO/VICODIN) 5-325 MG per tablet Take 1-2 tablets by mouth every 4 (four) hours as needed. 11/17/14   Debby Freiberg, MD  predniSONE (DELTASONE) 10 MG tablet Take 5 tablets (50 mg total) by mouth daily with breakfast. Patient not taking: Reported on 11/17/2014 06/16/14   Tasrif Ahmed, MD   BP 135/64 mmHg  Pulse 94  Temp(Src) 97.9 F (36.6 C) (Oral)  Resp 22  SpO2 95% Physical Exam  Constitutional: She is  oriented to person, place, and time. She appears well-developed and well-nourished.  HENT:  Head: Normocephalic and atraumatic.  Right Ear: External ear normal.  Left Ear: External ear normal.  Eyes: Conjunctivae and EOM are normal. Pupils are equal, round, and reactive to light.  Neck: Normal range of motion. Neck supple.  Cardiovascular: Normal rate, regular rhythm, normal heart sounds and intact distal pulses.   Pulmonary/Chest: Effort normal and breath sounds normal.  Abdominal: Soft. Bowel sounds are normal. There is no tenderness.  Musculoskeletal: Normal range of motion.       Thoracic back: She exhibits tenderness and bony tenderness.       Lumbar back: She exhibits tenderness and bony tenderness.  Neurological: She is alert and oriented to person, place, and time.  Skin: Skin is warm and dry.  Vitals reviewed.   ED Course  Procedures (including critical care time) Labs Review Labs Reviewed  CBC WITH DIFFERENTIAL/PLATELET - Abnormal; Notable for the following:    RBC 5.25 (*)    HCT 47.3 (*)    All other components within normal limits  COMPREHENSIVE METABOLIC PANEL - Abnormal; Notable for the following:    Potassium 3.2 (*)    Glucose, Bld 135 (*)    Total Protein 5.9 (*)    Albumin 3.0 (*)    AST 13 (*)    ALT 11 (*)    Alkaline Phosphatase 170 (*)    All other components within normal limits  URINALYSIS, ROUTINE W REFLEX MICROSCOPIC - Abnormal; Notable for the following:    APPearance CLOUDY (*)    Hgb urine dipstick MODERATE (*)    Bilirubin Urine SMALL (*)    Leukocytes, UA SMALL (*)    All other components within normal limits  URINE MICROSCOPIC-ADD ON - Abnormal; Notable for the following:    Bacteria, UA MANY (*)    Crystals CA OXALATE CRYSTALS (*)    All other components within normal limits    Imaging Review Dg Chest 2 View  11/17/2014   CLINICAL DATA:  Smoker, cough for several weeks.  Back pain.  EXAM: CHEST  2 VIEW  COMPARISON:  09/10/2014.   FINDINGS: Trachea is midline. Heart size is grossly stable. Lungs are low in volume with scattered subsegmental atelectasis or scarring at the lung bases. No airspace consolidation or pleural fluid. Scattered thoracic compression fractures are stable. New upper lumbar compression fracture.  IMPRESSION: 1. No acute findings in the chest. 2. Upper lumbar compression fracture appears new.   Electronically Signed   By: Lorin Picket M.D.   On: 11/17/2014 13:49   Dg Thoracic Spine 2 View  11/17/2014   CLINICAL DATA:  Lower back injury in January.  Continued pain.  EXAM: THORACIC SPINE - 2 VIEW  COMPARISON:  Chest radiograph 09/10/2014. CT angio chest 06/12/2014. Chest x-ray earlier today. Lumbar spine reported separately.  FINDINGS: Chronic compression fractures of T4,  T8 and T11 are redemonstrated. T8 fracture is unchanged from 2015. T11 fracture and T4 fracture is not significantly worse compared with March 2016. Both T8 and T11 display severe loss of height anteriorly. Mild wedging is present at T4. Significant retropulsion is not established. There is diffuse osteopenia.  Mild superior L1 superior endplate deformity appears new from March.  IMPRESSION: Chronic compression fractures at T8 and T11 are redemonstrated and stable.  Mild superior L1 fracture likely acute, and appears new from previous chest radiograph dated March 2016.  If further investigation desired, consider MRI if no contraindications.   Electronically Signed   By: Rolla Flatten M.D.   On: 11/17/2014 14:41   Dg Lumbar Spine Complete  11/17/2014   CLINICAL DATA:  Low back injury in January of 2016 ; onset of acute pain this morning, no definite radicular symptoms  EXAM: LUMBAR SPINE - COMPLETE 4+ VIEW  COMPARISON:  Lumbar spine series of Nov 03, 2014  FINDINGS: A single cross-table lateral view of the lumbar spine from L3 inferiorly is reviewed. There is mild endplate depression at L4 which is stable. No spondylolisthesis from L3-4 inferiorly is  demonstrated. There is mild disc space narrowing at L5-S1. There is moderate facet joint hypertrophy at L4-5 and L5-S1.  IMPRESSION: Stable partial compression of the body of L4 along the superior endplate. Stable degenerative facet joint change and L5-S1 disc space narrowing.   Electronically Signed   By: David  Martinique M.D.   On: 11/17/2014 14:08   Ct Renal Stone Study  11/17/2014   CLINICAL DATA:  Low back pain, known compression deformities  EXAM: CT ABDOMEN AND PELVIS WITHOUT CONTRAST  TECHNIQUE: Multidetector CT imaging of the abdomen and pelvis was performed following the standard protocol without IV contrast.  COMPARISON:  11/17/2014, 11/03/2014.  FINDINGS: Lung bases are free of acute infiltrate or sizable effusion.  The liver, gallbladder, spleen, adrenal glands and pancreas are within normal limits with the exception of a small hyperdense lesions within the adrenal glands bilaterally. They measure approximately 1.5 cm there is some symmetrical most consistent with small adrenal adenomas.  The kidneys are well visualized bilaterally and demonstrate some nonobstructing renal calculi. The largest of these lies in the lower pole of the left kidney measuring approximately 5 mm. The collecting systems and ureters are within normal limits. The bladder is partially distended.  Diffuse aortoiliac calcifications are noted. An IVC filter is noted in satisfactory position. The uterus has been surgically removed. No free pelvic fluid or pelvic mass lesion is noted. The osseous structures show changes consistent with stable T11 and and L4 compression deformities. A mild L1 compression deformity is noted, new from the prior plain film examination of 11/03/2014. Degenerative changes of the lumbar spine are seen. No other fractures are noted.  IMPRESSION: Stable T11 and L4 fractures when compared with previous exams.  New L1 mild compression deformity along the superior endplate.  Bilateral likely adrenal adenomas.   Bilateral nonobstructing renal calculi.   Electronically Signed   By: Inez Catalina M.D.   On: 11/17/2014 16:02     EKG Interpretation None      MDM   Final diagnoses:  Back pain    64 y.o. female with pertinent PMH of DVT on coumadin, DM presents with atraumatic low back pain.  Physical exam demonstrated continued tenderness of spine.  XR without fracture.  CT scan after hematuria demonstrated bil nonobstructive nephrolithiasis, new L1 compression fracture, which is likely etiology of pt pain.  No new neuro deficits or signs of cauda equina.  Will have pt fu with neurosurg.  DC home in stable condition.    I have reviewed all laboratory and imaging studies if ordered as above  1. Back pain         Debby Freiberg, MD 11/18/14 1026

## 2014-11-17 NOTE — ED Notes (Signed)
Hx of back fractures, heard a pop in back this am and has pain was to have an mri sat. Pain worse minimal movement. No loss of bowels. 150 fent given PTA, 20g lt ac,

## 2014-12-02 ENCOUNTER — Other Ambulatory Visit: Payer: Self-pay | Admitting: Internal Medicine

## 2014-12-02 DIAGNOSIS — E2839 Other primary ovarian failure: Secondary | ICD-10-CM

## 2014-12-04 ENCOUNTER — Emergency Department (HOSPITAL_COMMUNITY)
Admission: EM | Admit: 2014-12-04 | Discharge: 2014-12-04 | Disposition: A | Discharge status: Home / Self Care | Payer: Medicare Other | Attending: Emergency Medicine | Admitting: Emergency Medicine

## 2014-12-04 ENCOUNTER — Emergency Department (HOSPITAL_COMMUNITY): Payer: Medicare Other

## 2014-12-04 ENCOUNTER — Encounter (HOSPITAL_COMMUNITY): Payer: Self-pay | Admitting: Emergency Medicine

## 2014-12-04 DIAGNOSIS — Z79899 Other long term (current) drug therapy: Secondary | ICD-10-CM | POA: Insufficient documentation

## 2014-12-04 DIAGNOSIS — Z720 Tobacco use: Secondary | ICD-10-CM | POA: Insufficient documentation

## 2014-12-04 DIAGNOSIS — E669 Obesity, unspecified: Secondary | ICD-10-CM | POA: Diagnosis not present

## 2014-12-04 DIAGNOSIS — Z791 Long term (current) use of non-steroidal anti-inflammatories (NSAID): Secondary | ICD-10-CM | POA: Insufficient documentation

## 2014-12-04 DIAGNOSIS — N390 Urinary tract infection, site not specified: Secondary | ICD-10-CM | POA: Insufficient documentation

## 2014-12-04 DIAGNOSIS — Z8542 Personal history of malignant neoplasm of other parts of uterus: Secondary | ICD-10-CM | POA: Diagnosis not present

## 2014-12-04 DIAGNOSIS — Z86718 Personal history of other venous thrombosis and embolism: Secondary | ICD-10-CM | POA: Diagnosis not present

## 2014-12-04 DIAGNOSIS — Z8781 Personal history of (healed) traumatic fracture: Secondary | ICD-10-CM | POA: Diagnosis not present

## 2014-12-04 DIAGNOSIS — Z9851 Tubal ligation status: Secondary | ICD-10-CM | POA: Insufficient documentation

## 2014-12-04 DIAGNOSIS — E119 Type 2 diabetes mellitus without complications: Secondary | ICD-10-CM | POA: Insufficient documentation

## 2014-12-04 DIAGNOSIS — Z7951 Long term (current) use of inhaled steroids: Secondary | ICD-10-CM | POA: Insufficient documentation

## 2014-12-04 DIAGNOSIS — R109 Unspecified abdominal pain: Secondary | ICD-10-CM

## 2014-12-04 DIAGNOSIS — R1030 Lower abdominal pain, unspecified: Secondary | ICD-10-CM | POA: Diagnosis present

## 2014-12-04 DIAGNOSIS — Z794 Long term (current) use of insulin: Secondary | ICD-10-CM | POA: Insufficient documentation

## 2014-12-04 DIAGNOSIS — Z90710 Acquired absence of both cervix and uterus: Secondary | ICD-10-CM | POA: Insufficient documentation

## 2014-12-04 LAB — CBC WITH DIFFERENTIAL/PLATELET
Basophils Absolute: 0.1 10*3/uL (ref 0.0–0.1)
Basophils Relative: 1 % (ref 0–1)
EOS PCT: 2 % (ref 0–5)
Eosinophils Absolute: 0.2 10*3/uL (ref 0.0–0.7)
HEMATOCRIT: 46 % (ref 36.0–46.0)
Hemoglobin: 14.7 g/dL (ref 12.0–15.0)
Lymphocytes Relative: 26 % (ref 12–46)
Lymphs Abs: 2.7 10*3/uL (ref 0.7–4.0)
MCH: 28.3 pg (ref 26.0–34.0)
MCHC: 32 g/dL (ref 30.0–36.0)
MCV: 88.6 fL (ref 78.0–100.0)
MONO ABS: 1 10*3/uL (ref 0.1–1.0)
Monocytes Relative: 9 % (ref 3–12)
Neutro Abs: 6.6 10*3/uL (ref 1.7–7.7)
Neutrophils Relative %: 62 % (ref 43–77)
Platelets: 389 10*3/uL (ref 150–400)
RBC: 5.19 MIL/uL — ABNORMAL HIGH (ref 3.87–5.11)
RDW: 15.6 % — ABNORMAL HIGH (ref 11.5–15.5)
WBC: 10.6 10*3/uL — ABNORMAL HIGH (ref 4.0–10.5)

## 2014-12-04 LAB — COMPREHENSIVE METABOLIC PANEL
ALK PHOS: 224 U/L — AB (ref 38–126)
ALT: 12 U/L — AB (ref 14–54)
ANION GAP: 10 (ref 5–15)
AST: 18 U/L (ref 15–41)
Albumin: 2.7 g/dL — ABNORMAL LOW (ref 3.5–5.0)
BUN: 17 mg/dL (ref 6–20)
CO2: 27 mmol/L (ref 22–32)
Calcium: 8.5 mg/dL — ABNORMAL LOW (ref 8.9–10.3)
Chloride: 103 mmol/L (ref 101–111)
Creatinine, Ser: 0.91 mg/dL (ref 0.44–1.00)
GFR calc non Af Amer: >60 mL/min (ref >60)
GLUCOSE: 151 mg/dL — AB (ref 65–99)
Potassium: 3.3 mmol/L — ABNORMAL LOW (ref 3.5–5.1)
SODIUM: 140 mmol/L (ref 135–145)
TOTAL PROTEIN: 6.2 g/dL — AB (ref 6.5–8.1)
Total Bilirubin: 0.3 mg/dL (ref 0.3–1.2)

## 2014-12-04 LAB — URINALYSIS, ROUTINE W REFLEX MICROSCOPIC
Glucose, UA: NEGATIVE mg/dL
Hgb urine dipstick: NEGATIVE
KETONES UR: NEGATIVE mg/dL
Nitrite: NEGATIVE
Protein, ur: NEGATIVE mg/dL
SPECIFIC GRAVITY, URINE: 1.022 (ref 1.005–1.030)
Urobilinogen, UA: 1 mg/dL (ref 0.0–1.0)
pH: 6 (ref 5.0–8.0)

## 2014-12-04 LAB — URINE MICROSCOPIC-ADD ON

## 2014-12-04 MED ORDER — FENTANYL CITRATE (PF) 100 MCG/2ML IJ SOLN
100.0000 ug | Freq: Once | INTRAMUSCULAR | Status: AC
  Administered 2014-12-04: 100 ug via INTRAVENOUS
  Filled 2014-12-04: qty 2

## 2014-12-04 MED ORDER — ONDANSETRON HCL 4 MG/2ML IJ SOLN
4.0000 mg | Freq: Once | INTRAMUSCULAR | Status: AC
  Administered 2014-12-04: 4 mg via INTRAVENOUS
  Filled 2014-12-04: qty 2

## 2014-12-04 MED ORDER — SODIUM CHLORIDE 0.9 % IV BOLUS (SEPSIS)
1000.0000 mL | Freq: Once | INTRAVENOUS | Status: AC
  Administered 2014-12-04: 1000 mL via INTRAVENOUS

## 2014-12-04 MED ORDER — HYDROCODONE-ACETAMINOPHEN 10-325 MG PO TABS: 1.0000 | ORAL_TABLET | Freq: Four times a day (QID) | ORAL | Status: DC | PRN

## 2014-12-04 MED ORDER — CEPHALEXIN 500 MG PO CAPS: 500.0000 mg | ORAL_CAPSULE | Freq: Four times a day (QID) | ORAL | Status: DC

## 2014-12-04 MED ORDER — DEXTROSE 5 % IV SOLN
1.0000 g | Freq: Once | INTRAVENOUS | Status: AC
  Administered 2014-12-04: 1 g via INTRAVENOUS
  Filled 2014-12-04: qty 10

## 2014-12-04 MED ORDER — HYDROMORPHONE HCL 1 MG/ML IJ SOLN
1.0000 mg | Freq: Once | INTRAMUSCULAR | Status: AC
  Administered 2014-12-04: 1 mg via INTRAVENOUS
  Filled 2014-12-04: qty 1

## 2014-12-04 MED ORDER — PROMETHAZINE HCL 25 MG PO TABS
25.0000 mg | ORAL_TABLET | Freq: Four times a day (QID) | ORAL | Status: DC | PRN
Start: 2014-12-04 — End: 2015-01-08

## 2014-12-04 MED ORDER — HYDROCODONE-ACETAMINOPHEN 5-325 MG PO TABS
2.0000 | ORAL_TABLET | Freq: Once | ORAL | Status: AC
  Administered 2014-12-04: 2 via ORAL
  Filled 2014-12-04: qty 2

## 2014-12-04 MED ORDER — METHOCARBAMOL 500 MG PO TABS: 500.0000 mg | ORAL_TABLET | Freq: Two times a day (BID) | ORAL | Status: DC | PRN

## 2014-12-04 NOTE — Discharge Instructions (Signed)
Have your doctor recheck your INR blood test in 5 days  Please call your doctor for a followup appointment within 24-48 hours. When you talk to your doctor please let them know that you were seen in the emergency department and have them acquire all of your records so that they can discuss the findings with you and formulate a treatment plan to fully care for your new and ongoing problems.

## 2014-12-04 NOTE — ED Provider Notes (Signed)
CSN: [id number]     Arrival date & time 12/04/14  1246 History   First MD Initiated Contact with Patient 12/04/14 1308     Chief Complaint  Patient presents with  . Flank Pain     (Consider location/radiation/quality/duration/timing/severity/associated sxs/prior Treatment) HPI Comments: The patient is a 64 year old female, she is obese, she has a history of lymphedema chronically, history of uterine cancer status post hysterectomy and a history of back pain. She suffered a compression fracture recently of the first lumbar vertebrae, she has followed up with Dr. Rolena Infante and has further testing planned. She states that approximately one week ago she developed right flank pain, this was gradual in onset, has been persistent throughout the week but became severe yesterday and has been persistent throughout the last 2 days. It is sharp and stabbing, located in the right flank, does not radiate to the lower abdomen, it is not associated with nausea vomiting or postprandial pain. She has had decreased oral intake and has had decreased urinary output. She has infrequent stools, require stool accidents, this has not changed. There is no fevers chills coughing or shortness of breath and she denies chest pain.  Patient is a 64 y.o. female presenting with flank pain. The history is provided by the patient, medical records and the EMS personnel.  Flank Pain    Past Medical History  Diagnosis Date  . Uterine cancer   . DVT (deep venous thrombosis)   . Lymphedema of both lower extremities   . Obesity   . Tobacco abuse   . Arthritis   . Diabetes mellitus without complication   . Fractures     compression (2)   Past Surgical History  Procedure Laterality Date  . Tubal ligation    . Abdominal hysterectomy    . Back surgery    . Cystocele repair     Family History  Problem Relation Age of Onset  . Hypertension     History  Substance Use Topics  . Smoking status: Current Every Day Smoker -- 0.75  packs/day for 47 years    Types: Cigarettes  . Smokeless tobacco: Never Used  . Alcohol Use: No   OB History    No data available     Review of Systems  Genitourinary: Positive for flank pain.  All other systems reviewed and are negative.     Allergies  Darvon; Percocet; and Valium  Home Medications   Prior to Admission medications   Medication Sig Start Date End Date Taking? Authorizing Provider  ACCU-CHEK AVIVA PLUS test strip 1 each by Other route as directed.  09/08/14  Yes Historical Provider, MD  atorvastatin (LIPITOR) 20 MG tablet Take 10 mg by mouth daily at 6 PM.  11/11/13  Yes Historical Provider, MD  B Complex Vitamins (B-COMPLEX/B-12 PO) Take 1 tablet by mouth daily.   Yes Historical Provider, MD  DEXILANT 60 MG capsule Take 60 mg by mouth daily.  11/11/13  Yes Historical Provider, MD  fluticasone (FLONASE) 50 MCG/ACT nasal spray Place 2 sprays into both nostrils daily as needed for allergies (allergies).  11/11/13  Yes Historical Provider, MD  furosemide (LASIX) 20 MG tablet Take 2 tablets (40 mg total) by mouth daily. Patient taking differently: Take 35 mg by mouth daily.  06/21/14  Yes Ejiroghene Arlyce Dice, MD  Insulin Glargine (LANTUS SOLOSTAR) 100 UNIT/ML Solostar Pen Inject 10 Units into the skin daily at 10 pm. Patient taking differently: Inject 6 Units into the skin daily at 10  pm.  06/15/14  Yes Tasrif Ahmed, MD  Insulin Pen Needle 29G X 12.7MM MISC Use with lantus solostar pen. Inject 10 unit daily lantus. 06/15/14  Yes Tasrif Ahmed, MD  montelukast (SINGULAIR) 10 MG tablet Take 10 mg by mouth at bedtime.  11/11/13  Yes Historical Provider, MD  polyethylene glycol (MIRALAX / GLYCOLAX) packet Take 17 g by mouth daily.   Yes Historical Provider, MD  Vitamin D, Ergocalciferol, (DRISDOL) 50000 UNITS CAPS capsule Take 1 capsule (50,000 Units total) by mouth every 7 (seven) days. Patient taking differently: Take 50,000 Units by mouth every Monday.  06/15/14  Yes Tasrif  Ahmed, MD  warfarin (COUMADIN) 3 MG tablet Take 3.5 mg by mouth daily. Takes a whole and 1/4 of a tab everyday   Yes Historical Provider, MD  cephALEXin (KEFLEX) 500 MG capsule Take 1 capsule (500 mg total) by mouth 4 (four) times daily. 12/04/14   Noemi Chapel, MD  HYDROcodone-acetaminophen (NORCO) 10-325 MG per tablet Take 1 tablet by mouth every 6 (six) hours as needed. 12/04/14   Noemi Chapel, MD  methocarbamol (ROBAXIN) 500 MG tablet Take 1 tablet (500 mg total) by mouth 2 (two) times daily as needed for muscle spasms. 12/04/14   Noemi Chapel, MD  predniSONE (DELTASONE) 10 MG tablet Take 5 tablets (50 mg total) by mouth daily with breakfast. Patient not taking: Reported on 11/17/2014 06/16/14   Dellia Nims, MD  promethazine (PHENERGAN) 25 MG tablet Take 1 tablet (25 mg total) by mouth every 6 (six) hours as needed for nausea or vomiting. 12/04/14   Noemi Chapel, MD   BP 183/96 mmHg  Pulse 92  Temp(Src) 97.7 F (36.5 C) (Oral)  Resp 24  SpO2 100% Physical Exam  Constitutional: She appears well-developed and well-nourished. No distress.  HENT:  Head: Normocephalic and atraumatic.  Mouth/Throat: Oropharynx is clear and moist. No oropharyngeal exudate.  Eyes: Conjunctivae and EOM are normal. Pupils are equal, round, and reactive to light. Right eye exhibits no discharge. Left eye exhibits no discharge. No scleral icterus.  Neck: Normal range of motion. Neck supple. No JVD present. No thyromegaly present.  Cardiovascular: Normal rate, regular rhythm, normal heart sounds and intact distal pulses.  Exam reveals no gallop and no friction rub.   No murmur heard. Pulmonary/Chest: Effort normal and breath sounds normal. No respiratory distress. She has no wheezes. She has no rales.  Abdominal: Soft. Bowel sounds are normal. She exhibits no distension and no mass. There is tenderness (tenderness in the right upper quadrant, right lower quadrant, right mid abdomen and right flank).  No left-sided  tenderness, no masses, no peritoneal signs, no tympanitic sounds to percussion  Musculoskeletal: Normal range of motion. She exhibits edema. She exhibits no tenderness.  Chronic symmetrical lymphedema of the bilateral lower extremities  Lymphadenopathy:    She has no cervical adenopathy.  Neurological: She is alert. Coordination normal.  Skin: Skin is warm and dry. No rash noted. No erythema.  Psychiatric: She has a normal mood and affect. Her behavior is normal.  Nursing note and vitals reviewed.   ED Course  Procedures (including critical care time) Labs Review Labs Reviewed  COMPREHENSIVE METABOLIC PANEL - Abnormal; Notable for the following:    Potassium 3.3 (*)    Glucose, Bld 151 (*)    Calcium 8.5 (*)    Total Protein 6.2 (*)    Albumin 2.7 (*)    ALT 12 (*)    Alkaline Phosphatase 224 (*)    All other  components within normal limits  CBC WITH DIFFERENTIAL/PLATELET - Abnormal; Notable for the following:    WBC 10.6 (*)    RBC 5.19 (*)    RDW 15.6 (*)    All other components within normal limits  URINALYSIS, ROUTINE W REFLEX MICROSCOPIC (NOT AT [facility]) - Abnormal; Notable for the following:    APPearance CLOUDY (*)    Bilirubin Urine SMALL (*)    Leukocytes, UA SMALL (*)    All other components within normal limits  URINE MICROSCOPIC-ADD ON - Abnormal; Notable for the following:    Squamous Epithelial / LPF MANY (*)    Bacteria, UA FEW (*)    All other components within normal limits  URINE CULTURE    Imaging Review Ct Abdomen Pelvis Wo Contrast  12/04/2014   CLINICAL DATA:  Right-sided flank pain for 1-1/2 weeks. Chronic constipation.  EXAM: CT ABDOMEN AND PELVIS WITHOUT CONTRAST  TECHNIQUE: Multidetector CT imaging of the abdomen and pelvis was performed following the standard protocol without IV contrast.  COMPARISON:  CT of the abdomen and pelvis 11/17/2014.  FINDINGS: Right lower lobe peribronchial thickening and mild airspace consolidation is evident. The lungs are  otherwise clear. Coronary artery calcifications are again noted. The heart size is normal. No significant pleural or pericardial effusion is present.  The liver and spleen are within normal limits. The stomach, duodenum, and pancreas are normal. The common bile duct is within normal limits. The gallbladder is dilated. No inflammatory changes are present to suggest cholecystitis. Bilateral adrenal adenomas are stable. Two punctate nonobstructing stones in the right kidney are again seen. There 2 punctate nonobstructing stones at the lower pole of the left kidney. The ureters are within normal limits bilaterally. Urinary bladder is mostly collapsed.  The rectosigmoid colon is within normal limits. The remainder the colon is unremarkable. The appendix is visualized and normal. Small bowel is within normal limits an IVC filter is in place. Extensive atherosclerotic calcifications are present in the aorta and branch vessels without aneurysm.  Compression fractures at T11, L1, and L4 are stable. No new fractures are present. Moderate facet degenerative changes are present in the lower lumbar spine.  IMPRESSION: 1. Bilateral nonobstructing renal calculi are stable. 2. Bilateral adrenal adenomas. 3. Stable compression fractures at T11, L4, and L1. 4. Extensive atherosclerotic changes without aneurysm.   Electronically Signed   By: San Morelle M.D.   On: 12/04/2014 15:32      MDM   Final diagnoses:  Flank pain  UTI (lower urinary tract infection)    The patient appears uncomfortable, this could be a colicky type pain with a kidney stone, would also consider biliary colic, labs pending, CT scan ordered, recent CT scan did show that she had kidney stones bilaterally, this does not appear to be midline or consistent with her L1 compression fracture. Pain medications ordered, fluids, check renal function and urinalysis to rule out pyelonephritis.  CT scan performed, patient informed of results, urinalysis  with possible infection, normal metabolic panel, white blood cell unremarkable, patient is well-appearing and stable for discharge with improved pain after medications. The family members have now arrived and state that she has home health nurse every day, also has a daughter who is a CNA, they will investigate rehabilitation placement.  Noemi Chapel, MD 12/04/14 [phone]

## 2014-12-04 NOTE — ED Notes (Signed)
Phone call complete to [facility], reported that pt is on list.

## 2014-12-04 NOTE — ED Notes (Signed)
[facility] here to transport pt back to home. 

## 2014-12-04 NOTE — ED Notes (Signed)
Bed: WA07 Expected date: 12/04/14 Expected time: 12:40 PM Means of arrival: Ambulance Comments: Kidney Stone

## 2014-12-04 NOTE — ED Notes (Signed)
PER EMS- pt picked up from home c/o r flank pain. Per family pt was [facility]. Pt has increased.  Reports 10/10.  Arrived to alert and oriented per baseline.

## 2014-12-05 LAB — URINE CULTURE: Colony Count: >=100000

## 2014-12-07 ENCOUNTER — Other Ambulatory Visit: Payer: Medicare Other

## 2014-12-07 ENCOUNTER — Ambulatory Visit
Admission: RE | Admit: 2014-12-07 | Discharge: 2014-12-07 | Disposition: A | Discharge status: Home / Self Care | Payer: Medicare Other | Source: Ambulatory Visit | Attending: Internal Medicine | Admitting: Internal Medicine

## 2014-12-07 DIAGNOSIS — E2839 Other primary ovarian failure: Secondary | ICD-10-CM

## 2014-12-16 ENCOUNTER — Other Ambulatory Visit: Payer: Self-pay | Admitting: Internal Medicine

## 2014-12-29 ENCOUNTER — Encounter (HOSPITAL_COMMUNITY): Payer: Self-pay

## 2014-12-29 ENCOUNTER — Encounter (HOSPITAL_COMMUNITY)
Admission: RE | Admit: 2014-12-29 | Discharge: 2014-12-29 | Disposition: A | Discharge status: Home / Self Care | Payer: Medicare Other | Source: Ambulatory Visit | Attending: Orthopedic Surgery | Admitting: Orthopedic Surgery

## 2014-12-29 ENCOUNTER — Encounter (HOSPITAL_COMMUNITY): Payer: Self-pay | Admitting: Vascular Surgery

## 2014-12-29 DIAGNOSIS — K219 Gastro-esophageal reflux disease without esophagitis: Secondary | ICD-10-CM | POA: Insufficient documentation

## 2014-12-29 DIAGNOSIS — Z87891 Personal history of nicotine dependence: Secondary | ICD-10-CM | POA: Insufficient documentation

## 2014-12-29 DIAGNOSIS — Z6838 Body mass index (BMI) 38.0-38.9, adult: Secondary | ICD-10-CM | POA: Diagnosis not present

## 2014-12-29 DIAGNOSIS — Z01812 Encounter for preprocedural laboratory examination: Secondary | ICD-10-CM | POA: Diagnosis not present

## 2014-12-29 DIAGNOSIS — Z01818 Encounter for other preprocedural examination: Secondary | ICD-10-CM | POA: Diagnosis present

## 2014-12-29 DIAGNOSIS — Z79899 Other long term (current) drug therapy: Secondary | ICD-10-CM | POA: Diagnosis not present

## 2014-12-29 DIAGNOSIS — Z7901 Long term (current) use of anticoagulants: Secondary | ICD-10-CM | POA: Diagnosis not present

## 2014-12-29 DIAGNOSIS — E119 Type 2 diabetes mellitus without complications: Secondary | ICD-10-CM | POA: Diagnosis not present

## 2014-12-29 DIAGNOSIS — Z86718 Personal history of other venous thrombosis and embolism: Secondary | ICD-10-CM | POA: Insufficient documentation

## 2014-12-29 DIAGNOSIS — J449 Chronic obstructive pulmonary disease, unspecified: Secondary | ICD-10-CM | POA: Diagnosis not present

## 2014-12-29 DIAGNOSIS — E669 Obesity, unspecified: Secondary | ICD-10-CM | POA: Insufficient documentation

## 2014-12-29 DIAGNOSIS — I890 Lymphedema, not elsewhere classified: Secondary | ICD-10-CM | POA: Insufficient documentation

## 2014-12-29 HISTORY — DX: Unspecified urinary incontinence: R32

## 2014-12-29 HISTORY — DX: Gastro-esophageal reflux disease without esophagitis: K21.9

## 2014-12-29 HISTORY — DX: Family history of other specified conditions: Z84.89

## 2014-12-29 HISTORY — DX: Essential (primary) hypertension: I10

## 2014-12-29 HISTORY — DX: Chronic obstructive pulmonary disease, unspecified: J44.9

## 2014-12-29 LAB — BASIC METABOLIC PANEL
Anion gap: 10 (ref 5–15)
BUN: 8 mg/dL (ref 6–20)
CALCIUM: 9.2 mg/dL (ref 8.9–10.3)
CO2: 24 mmol/L (ref 22–32)
CREATININE: 0.7 mg/dL (ref 0.44–1.00)
Chloride: 106 mmol/L (ref 101–111)
GFR calc Af Amer: >60 mL/min (ref >60)
GLUCOSE: 156 mg/dL — AB (ref 65–99)
Potassium: 3.6 mmol/L (ref 3.5–5.1)
Sodium: 140 mmol/L (ref 135–145)

## 2014-12-29 LAB — SURGICAL PCR SCREEN
MRSA, PCR: NEGATIVE
Staphylococcus aureus: POSITIVE — AB

## 2014-12-29 LAB — CBC
HCT: 41.7 % (ref 36.0–46.0)
Hemoglobin: 13.2 g/dL (ref 12.0–15.0)
MCH: 27.3 pg (ref 26.0–34.0)
MCHC: 31.7 g/dL (ref 30.0–36.0)
MCV: 86.2 fL (ref 78.0–100.0)
PLATELETS: 343 10*3/uL (ref 150–400)
RBC: 4.84 MIL/uL (ref 3.87–5.11)
RDW: 15.6 % — ABNORMAL HIGH (ref 11.5–15.5)
WBC: 8 10*3/uL (ref 4.0–10.5)

## 2014-12-29 LAB — PROTIME-INR
INR: 1.83 — AB (ref 0.00–1.49)
Prothrombin Time: 21.1 seconds — ABNORMAL HIGH (ref 11.6–15.2)

## 2014-12-29 MED ORDER — CEFAZOLIN SODIUM-DEXTROSE 2-3 GM-% IV SOLR: 2.0000 g | INTRAVENOUS | Status: DC

## 2014-12-29 NOTE — Progress Notes (Signed)
Anesthesia Chart Review:  Patient is a 64 year old female scheduled for L4, L1, T11 kyphoplasty tomorrow by Dr. Rolena Infante. PAT was this afternoon. I was asked to review chart after 4 PM.  History incldues recent former smoker (quit 12/26/14), uterine cancer, multiples DVT, lymphedema of BLE, obesity (BMI 38), DM2, HTN, COPD, GERD, back surgery, hysterectomy. Epic records indicate PCP is Alvester Chou, NP ([facility] in [place]).  Meds include Lipitor, Dexilant, Flonase, Norco, Lantus, Robaxin, Singulair, promethazine, warfarin (to hold five days preoperatively).  She was seen by cardiologist Dr. Rayann Heman in 06/2014 for postural dizziness and syncope. Volume depletion was felt to be the likely culprit. She had an echo, but no other additional heart monitoring or CV work-up was recommended at that time.   EKG 11/17/14: SR, inferior infarct (old), anterior infarct (old).  EKG overall stable when compared to 06/2014 EKGs in Epic.  There are no older EKGs in [place] or [facility].  06/13/14 Echo: Study Conclusions - Left ventricle: The cavity size was normal. Systolic function was normal. The estimated ejection fraction was in the range of 55% to 60%. Wall motion was normal; there were no regional wall motion abnormalities. Left ventricular diastolic function parameters were normal. - Left atrium: The atrium was mildly dilated.  Per patient, she denied prior stress test, although during her 06/2014 admission she told Dr. Genene Churn that she had a normal stress test a few months ago.  PAT RN said patient was not the best historian. There are 37 pages scanned under the Media tab from [facility]. There is no mention of a stress test.   08/23/14 Carotid duplex: Summary: Bilateral 1% to 39% ICA stenosis. Vertebral artery flow is antegrade.  11/17/14 CXR: IMPRESSION: 1. No acute findings in the chest. 2. Upper lumbar compression fracture appears new.  06/12/14 CTA  chest: IMPRESSION: 1. Negative for acute pulmonary embolus. 2. Nonspecific subpleural nodular opacities in the posteromedial aspect of the right lower lobe. The smaller opacity is essentially unchanged compared 03/01/2014 while the larger opacity has slightly increased in size. This may represent a region of focal round atelectasis or true pulmonary nodularity. Given the size of the larger nodule at over 1 cm (11 mm) further evaluation with PET-CT should be considered. Alternately, short interval followup with a repeat chest CT in 3 months would be reasonable. 3. Atherosclerosis including multivessel coronary artery disease. 4. Incompletely imaged IVC filter.  03/01/14 PFTs: FVC 2.01 (63%), FEV1 1.60 (66%), DLCO unc 11.44 (47%). Marked improvement in FEV1 after SABA suggests reversible airflow obstruction (asthma) though baseline may note be accurate due to coughing. Moderate restriction with disproportionate reduction in ERV typical of body habitus. Diffusion defect corrects to near normal -- correleate with O2 sats with exertion.   Preoperative labs noted. Glucose 156. PT/INR 21.1, INR 1.83. She will need a repeat PT/INR on arrival. I also ordered a PTT. Results of PT/INR called to [facility] at Dr. Rolena Infante' office.  I reviewed history, EKG, echo, chest CT (atherosclerosis) and Dr. Jackalyn Lombard note with anesthesiologist Dr. Tobias Alexander. Since no additional testing was recommended in 06/2014, would plan to proceed if patient without worrisome CV symptoms on evaluation tomorrow.  No documented or reported (per PAT RN) CV symptoms today. Proceeding will also depend on if Dr. Rolena Infante feels repeat labs are acceptable for OR.   Further evaluation by her assigned anesthesiologist tomorrow.    [facility] Short Stay Center/Anesthesiology Phone [phone] 12/29/2014 4:59  PM   

## 2014-12-29 NOTE — Progress Notes (Signed)
Surgical PCR is staph positive.  Patient made aware.  Prescription called to [facility].  Patient verbalized understanding and stated the prescription will be picked up and started.

## 2014-12-29 NOTE — Pre-Procedure Instructions (Signed)
    Pippa Hanif  12/29/2014      [facility] - [place], [place] - [address] [place] [address] Phone: [phone] Fax: [phone]  [place], [place], [place] [address] [place] [address] Phone: [phone] Fax: [phone]    Your procedure is scheduled on June 30th, 2016 at 1:00 PM.  Report to [facility] at 11:00 A.M.  Call this number if you have problems the morning of surgery:  [phone]   Remember:  Do not eat food or drink liquids after midnight.   Take these medicines the morning of surgery with A SIP OF WATER Flonase, Hydrocodone-Acetaminophen (norco), Dexilant, Methocarbamol (Robaxin), Prednisone (Deltasone), Promethazine (Phenergan)  Stop taking: Aspirin, Aleve, Naproxen, Ibuprofen, BC's, Goody's, all herbal medication, all vitamins, and fish oil.   Do not wear jewelry, make-up or nail polish.  Do not wear lotions, powders, or perfumes.  You may wear deodorant.  Do not shave 48 hours prior to surgery.    Do not bring valuables to the hospital.  [facility] is not responsible for any belongings or valuables.  Contacts, dentures or bridgework may not be worn into surgery.  Leave your suitcase in the car.  After surgery it may be brought to your room.  For patients admitted to the hospital, discharge time will be determined by your treatment team.  Patients discharged the day of surgery will not be allowed to drive home.   Special instructions:  See attached.  Please read over the following fact sheets that you were given. Pain Booklet, Coughing and Deep Breathing, MRSA Information and Surgical Site Infection Prevention

## 2014-12-29 NOTE — Progress Notes (Signed)
PCP- Alvester Chou  Cardiologist - denies Echo - 06/2014 in Epic Cardiac Cath - denies Stress Test  - denies EKG - 10/2014 - Epic CXR - 10/2014 in Epic

## 2014-12-30 ENCOUNTER — Ambulatory Visit (HOSPITAL_COMMUNITY): Admission: RE | Admit: 2014-12-30 | Payer: Medicare Other | Source: Ambulatory Visit | Admitting: Orthopedic Surgery

## 2014-12-30 ENCOUNTER — Encounter (HOSPITAL_COMMUNITY): Admission: RE | Payer: Self-pay | Source: Ambulatory Visit

## 2014-12-30 LAB — HEMOGLOBIN A1C
Hgb A1c MFr Bld: 9.2 % — ABNORMAL HIGH (ref 4.8–5.6)
Mean Plasma Glucose: 217 mg/dL

## 2014-12-30 SURGERY — KYPHOPLASTY
Anesthesia: General

## 2015-01-08 ENCOUNTER — Emergency Department (HOSPITAL_COMMUNITY): Payer: Medicare Other

## 2015-01-08 ENCOUNTER — Emergency Department (HOSPITAL_COMMUNITY)
Admission: EM | Admit: 2015-01-08 | Discharge: 2015-01-08 | Disposition: A | Discharge status: Home / Self Care | Payer: Medicare Other | Source: Home / Self Care | Attending: Emergency Medicine | Admitting: Emergency Medicine

## 2015-01-08 ENCOUNTER — Encounter (HOSPITAL_COMMUNITY): Payer: Self-pay | Admitting: *Deleted

## 2015-01-08 DIAGNOSIS — Z6835 Body mass index (BMI) 35.0-35.9, adult: Secondary | ICD-10-CM

## 2015-01-08 DIAGNOSIS — Z86718 Personal history of other venous thrombosis and embolism: Secondary | ICD-10-CM

## 2015-01-08 DIAGNOSIS — R1013 Epigastric pain: Secondary | ICD-10-CM | POA: Diagnosis not present

## 2015-01-08 DIAGNOSIS — Z87891 Personal history of nicotine dependence: Secondary | ICD-10-CM

## 2015-01-08 DIAGNOSIS — Z7901 Long term (current) use of anticoagulants: Secondary | ICD-10-CM | POA: Insufficient documentation

## 2015-01-08 DIAGNOSIS — R269 Unspecified abnormalities of gait and mobility: Secondary | ICD-10-CM | POA: Diagnosis present

## 2015-01-08 DIAGNOSIS — K859 Acute pancreatitis, unspecified: Secondary | ICD-10-CM

## 2015-01-08 DIAGNOSIS — Z79899 Other long term (current) drug therapy: Secondary | ICD-10-CM | POA: Insufficient documentation

## 2015-01-08 DIAGNOSIS — E119 Type 2 diabetes mellitus without complications: Secondary | ICD-10-CM | POA: Diagnosis present

## 2015-01-08 DIAGNOSIS — I890 Lymphedema, not elsewhere classified: Secondary | ICD-10-CM | POA: Diagnosis present

## 2015-01-08 DIAGNOSIS — Z792 Long term (current) use of antibiotics: Secondary | ICD-10-CM

## 2015-01-08 DIAGNOSIS — R32 Unspecified urinary incontinence: Secondary | ICD-10-CM | POA: Diagnosis present

## 2015-01-08 DIAGNOSIS — I10 Essential (primary) hypertension: Secondary | ICD-10-CM | POA: Insufficient documentation

## 2015-01-08 DIAGNOSIS — Z9851 Tubal ligation status: Secondary | ICD-10-CM | POA: Insufficient documentation

## 2015-01-08 DIAGNOSIS — E669 Obesity, unspecified: Secondary | ICD-10-CM | POA: Insufficient documentation

## 2015-01-08 DIAGNOSIS — M1990 Unspecified osteoarthritis, unspecified site: Secondary | ICD-10-CM | POA: Insufficient documentation

## 2015-01-08 DIAGNOSIS — M549 Dorsalgia, unspecified: Secondary | ICD-10-CM | POA: Insufficient documentation

## 2015-01-08 DIAGNOSIS — G9340 Encephalopathy, unspecified: Secondary | ICD-10-CM | POA: Diagnosis present

## 2015-01-08 DIAGNOSIS — Z8701 Personal history of pneumonia (recurrent): Secondary | ICD-10-CM | POA: Insufficient documentation

## 2015-01-08 DIAGNOSIS — J449 Chronic obstructive pulmonary disease, unspecified: Secondary | ICD-10-CM

## 2015-01-08 DIAGNOSIS — G8929 Other chronic pain: Secondary | ICD-10-CM | POA: Diagnosis present

## 2015-01-08 DIAGNOSIS — I2510 Atherosclerotic heart disease of native coronary artery without angina pectoris: Secondary | ICD-10-CM | POA: Diagnosis present

## 2015-01-08 DIAGNOSIS — K219 Gastro-esophageal reflux disease without esophagitis: Secondary | ICD-10-CM | POA: Diagnosis present

## 2015-01-08 DIAGNOSIS — R600 Localized edema: Secondary | ICD-10-CM

## 2015-01-08 DIAGNOSIS — R296 Repeated falls: Secondary | ICD-10-CM | POA: Diagnosis present

## 2015-01-08 DIAGNOSIS — Z8781 Personal history of (healed) traumatic fracture: Secondary | ICD-10-CM | POA: Insufficient documentation

## 2015-01-08 DIAGNOSIS — R109 Unspecified abdominal pain: Secondary | ICD-10-CM

## 2015-01-08 DIAGNOSIS — K5900 Constipation, unspecified: Secondary | ICD-10-CM | POA: Insufficient documentation

## 2015-01-08 DIAGNOSIS — Z90710 Acquired absence of both cervix and uterus: Secondary | ICD-10-CM

## 2015-01-08 DIAGNOSIS — Z8542 Personal history of malignant neoplasm of other parts of uterus: Secondary | ICD-10-CM

## 2015-01-08 DIAGNOSIS — Z8541 Personal history of malignant neoplasm of cervix uteri: Secondary | ICD-10-CM

## 2015-01-08 DIAGNOSIS — E876 Hypokalemia: Secondary | ICD-10-CM | POA: Diagnosis present

## 2015-01-08 DIAGNOSIS — Z7401 Bed confinement status: Secondary | ICD-10-CM

## 2015-01-08 DIAGNOSIS — E785 Hyperlipidemia, unspecified: Secondary | ICD-10-CM | POA: Diagnosis present

## 2015-01-08 LAB — CBC
HCT: 42.7 % (ref 36.0–46.0)
Hemoglobin: 13.4 g/dL (ref 12.0–15.0)
MCH: 27.6 pg (ref 26.0–34.0)
MCHC: 31.4 g/dL (ref 30.0–36.0)
MCV: 87.9 fL (ref 78.0–100.0)
Platelets: 357 10*3/uL (ref 150–400)
RBC: 4.86 MIL/uL (ref 3.87–5.11)
RDW: 15.4 % (ref 11.5–15.5)
WBC: 10.7 10*3/uL — ABNORMAL HIGH (ref 4.0–10.5)

## 2015-01-08 LAB — COMPREHENSIVE METABOLIC PANEL
ALBUMIN: 3.5 g/dL (ref 3.5–5.0)
ALT: 8 U/L — ABNORMAL LOW (ref 14–54)
ANION GAP: 10 (ref 5–15)
AST: 22 U/L (ref 15–41)
Alkaline Phosphatase: 228 U/L — ABNORMAL HIGH (ref 38–126)
BUN: 13 mg/dL (ref 6–20)
CO2: 25 mmol/L (ref 22–32)
CREATININE: 0.64 mg/dL (ref 0.44–1.00)
Calcium: 9.1 mg/dL (ref 8.9–10.3)
Chloride: 105 mmol/L (ref 101–111)
GFR calc Af Amer: >60 mL/min (ref >60)
GFR calc non Af Amer: >60 mL/min (ref >60)
Glucose, Bld: 115 mg/dL — ABNORMAL HIGH (ref 65–99)
Potassium: 4.1 mmol/L (ref 3.5–5.1)
Sodium: 140 mmol/L (ref 135–145)
TOTAL PROTEIN: 6.8 g/dL (ref 6.5–8.1)
Total Bilirubin: 1.1 mg/dL (ref 0.3–1.2)

## 2015-01-08 LAB — PROTIME-INR
INR: 1.54 — AB (ref 0.00–1.49)
PROTHROMBIN TIME: 18.6 s — AB (ref 11.6–15.2)

## 2015-01-08 LAB — I-STAT CG4 LACTIC ACID, ED: LACTIC ACID, VENOUS: 1.55 mmol/L (ref 0.5–2.0)

## 2015-01-08 LAB — LIPASE, BLOOD: Lipase: 166 U/L — ABNORMAL HIGH (ref 22–51)

## 2015-01-08 MED ORDER — MORPHINE SULFATE 4 MG/ML IJ SOLN
4.0000 mg | Freq: Once | INTRAMUSCULAR | Status: AC
  Administered 2015-01-08: 4 mg via INTRAVENOUS
  Filled 2015-01-08: qty 1

## 2015-01-08 MED ORDER — ONDANSETRON HCL 4 MG/2ML IJ SOLN
4.0000 mg | Freq: Once | INTRAMUSCULAR | Status: AC
  Administered 2015-01-08: 4 mg via INTRAVENOUS
  Filled 2015-01-08: qty 2

## 2015-01-08 MED ORDER — SODIUM CHLORIDE 0.9 % IV BOLUS (SEPSIS)
500.0000 mL | Freq: Once | INTRAVENOUS | Status: AC
  Administered 2015-01-08: 500 mL via INTRAVENOUS

## 2015-01-08 MED ORDER — IOHEXOL 300 MG/ML  SOLN
50.0000 mL | Freq: Once | INTRAMUSCULAR | Status: AC | PRN
  Administered 2015-01-08: 50 mL via ORAL

## 2015-01-08 MED ORDER — IOHEXOL 300 MG/ML  SOLN
100.0000 mL | Freq: Once | INTRAMUSCULAR | Status: AC | PRN
  Administered 2015-01-08: 100 mL via INTRAVENOUS

## 2015-01-08 NOTE — ED Notes (Signed)
Per EMS pt coming from home with c/o generalized abdominal pain and constipation, reports taking miralax yesterday and had small BM this morning. Pt also c/o of chronic lower back pain for which she takes hydrocodone 7.5-325

## 2015-01-08 NOTE — ED Notes (Signed)
Patient transported to CT 

## 2015-01-08 NOTE — ED Provider Notes (Signed)
CSN: [id number]     Arrival date & time 01/08/15  1357 History   First MD Initiated Contact with Patient 01/08/15 1458     Chief Complaint  Patient presents with  . Abdominal Pain  . Back Pain  . Constipation     (Consider location/radiation/quality/duration/timing/severity/associated sxs/prior Treatment) Patient is a 64 y.o. female presenting with abdominal pain, back pain, and constipation. The history is provided by the patient.  Abdominal Pain Pain location:  LUQ and LLQ Pain quality: cramping   Pain radiates to:  Does not radiate Pain severity:  Moderate Onset quality:  Sudden Timing:  Constant Progression:  Unchanged Chronicity:  New Context: not previous surgeries   Context comment:  Is on opiates Relieved by:  Nothing Worsened by:  Nothing tried Associated symptoms: constipation   Associated symptoms: no chest pain, no cough, no fever, no shortness of breath and no vomiting   Back Pain Associated symptoms: abdominal pain   Associated symptoms: no chest pain and no fever   Constipation Associated symptoms: abdominal pain and back pain   Associated symptoms: no fever and no vomiting     Past Medical History  Diagnosis Date  . Uterine cancer   . DVT (deep venous thrombosis)   . Lymphedema of both lower extremities   . Obesity   . Tobacco abuse   . Arthritis   . Fractures     compression (2)  . Family history of adverse reaction to anesthesia     pt. states that brother and dad become aggressive when trying to wake up  . Diabetes mellitus without complication     pt. states that she no longer has diabetes  . Hypertension     pt. states history of  . COPD (chronic obstructive pulmonary disease)     pt. states that one doctor says she does but one doctor says no  . History of pneumonia   . History of bronchitis   . Urinary incontinence   . GERD (gastroesophageal reflux disease)    Past Surgical History  Procedure Laterality Date  . Tubal ligation    .  Abdominal hysterectomy    . Back surgery    . Cystocele repair    . Colonoscopy    . Esophagogastroduodenoscopy    . Eye surgery Bilateral     cataracts  . Intraoperative arteriogram     Family History  Problem Relation Age of Onset  . Hypertension     History  Substance Use Topics  . Smoking status: Former Smoker -- 0.75 packs/day for 47 years    Types: Cigarettes    Quit date: 12/26/2014  . Smokeless tobacco: Never Used  . Alcohol Use: No   OB History    No data available     Review of Systems  Constitutional: Negative for fever.  Respiratory: Negative for cough and shortness of breath.   Cardiovascular: Negative for chest pain and leg swelling.  Gastrointestinal: Positive for abdominal pain and constipation. Negative for vomiting.  Musculoskeletal: Positive for back pain.  All other systems reviewed and are negative.     Allergies  Darvon; Percocet; and Valium  Home Medications   Prior to Admission medications   Medication Sig Start Date End Date Taking? Authorizing Provider  atorvastatin (LIPITOR) 20 MG tablet Take 20 mg by mouth daily at 6 PM.  11/11/13  Yes Historical Provider, MD  DEXILANT 60 MG capsule Take 60 mg by mouth daily.  11/11/13  Yes Historical Provider, MD  fluticasone (  FLONASE) 50 MCG/ACT nasal spray Place 2 sprays into both nostrils daily as needed for allergies (allergies).  11/11/13  Yes Historical Provider, MD  FORTEO 600 MCG/2.4ML SOLN Inject 20 mcg into the skin every evening.  01/07/15  Yes Historical Provider, MD  furosemide (LASIX) 40 MG tablet Take 20 mg by mouth daily.  12/08/14  Yes Historical Provider, MD  HYDROcodone-acetaminophen ([place]) 7.5-325 MG per tablet Take 1 tablet by mouth every 6 (six) hours as needed for moderate pain.   Yes Historical Provider, MD  montelukast (SINGULAIR) 10 MG tablet Take 10 mg by mouth at bedtime.  11/11/13  Yes Historical Provider, MD  mupirocin ointment (BACTROBAN) 2 % Apply 1 application topically daily.   12/29/14  Yes Historical Provider, MD  polyethylene glycol (MIRALAX / GLYCOLAX) packet Take 17 g by mouth daily.   Yes Historical Provider, MD  warfarin (COUMADIN) 1 MG tablet Take 3.5 mg by mouth daily.   Yes Historical Provider, MD  methocarbamol (ROBAXIN) 500 MG tablet Take 1 tablet (500 mg total) by mouth 2 (two) times daily as needed for muscle spasms. Patient not taking: Reported on 01/08/2015 12/04/14   Noemi Chapel, MD  Vitamin D, Ergocalciferol, (DRISDOL) 50000 UNITS CAPS capsule Take 1 capsule (50,000 Units total) by mouth every 7 (seven) days. Patient taking differently: Take 50,000 Units by mouth every Monday.  06/15/14   Tasrif Ahmed, MD   BP 155/60 mmHg  Pulse 91  Temp(Src) 97.9 F (36.6 C) (Oral)  Resp 20  SpO2 91% Physical Exam  Constitutional: She is oriented to person, place, and time. She appears well-developed and well-nourished. No distress.  HENT:  Head: Normocephalic and atraumatic.  Mouth/Throat: Oropharynx is clear and moist.  Eyes: EOM are normal. Pupils are equal, round, and reactive to light.  Neck: Normal range of motion. Neck supple.  Cardiovascular: Normal rate and regular rhythm.  Exam reveals no friction rub.   No murmur heard. Pulmonary/Chest: Effort normal and breath sounds normal. No respiratory distress. She has no wheezes. She has no rales.  Abdominal: Soft. She exhibits no distension. There is no tenderness. There is no rebound.  Musculoskeletal: Normal range of motion. She exhibits edema (severe, diffuse lower extremities bilaterally. 2+ pitting, 4 + nonpitting).  Neurological: She is alert and oriented to person, place, and time.  Skin: Skin is warm. No rash noted. She is not diaphoretic.  Nursing note and vitals reviewed.   ED Course  Procedures (including critical care time) Labs Review Labs Reviewed  CBC - Abnormal; Notable for the following:    WBC 10.7 (*)    All other components within normal limits  COMPREHENSIVE METABOLIC PANEL -  Abnormal; Notable for the following:    Glucose, Bld 115 (*)    ALT 8 (*)    Alkaline Phosphatase 228 (*)    All other components within normal limits  LIPASE, BLOOD - Abnormal; Notable for the following:    Lipase 166 (*)    All other components within normal limits  PROTIME-INR - Abnormal; Notable for the following:    Prothrombin Time 18.6 (*)    INR 1.54 (*)    All other components within normal limits  I-STAT CG4 LACTIC ACID, ED    Imaging Review Ct Abdomen Pelvis W Contrast  01/08/2015   CLINICAL DATA:  Generalized abdominal pain and constipation.  EXAM: CT ABDOMEN AND PELVIS WITH CONTRAST  TECHNIQUE: Multidetector CT imaging of the abdomen and pelvis was performed using the standard protocol following bolus administration  of intravenous contrast.  CONTRAST:  142mL OMNIPAQUE IOHEXOL 300 MG/ML SOLN, 30mL OMNIPAQUE IOHEXOL 300 MG/ML SOLN  COMPARISON:  12/04/2014  FINDINGS: Lower chest: The lung bases are clear of acute process. Minimal scarring and/or atelectasis. The heart is normal in size. No pericardial effusion. Coronary artery calcifications are noted. The distal esophagus is grossly normal.  Hepatobiliary: No focal hepatic lesions or intrahepatic biliary dilatation. The gallbladder is normal. No common bile duct dilatation.  Pancreas: Moderate atrophy but no mass or inflammation.  Spleen: Normal size.  No focal lesions.  Adrenals/Urinary Tract: Stable bilateral adrenal gland adenomas.  Bilateral renal calculi but no obstructing ureteral calculi or bladder calculi. Both kidneys demonstrate normal enhancement/ perfusion. No worrisome renal lesions. The delayed images do not demonstrate any significant collecting system abnormality.  Stomach/Bowel: The stomach, duodenum, small bowel and colon are unremarkable. No inflammatory changes, mass lesions or obstructive findings.  Vascular/Lymphatic: Advanced atherosclerotic calcifications involving the aorta and branch vessels but no focal aneurysm.  The major venous structures are patent. Stable IVC filter.  Other: The uterus is surgically absent. There is a mild cystocele noted. No pelvic mass or adenopathy. No free pelvic fluid collections. No inguinal mass or adenopathy.  Musculoskeletal: Numerous thoracic and lumbar compression fractures. T8, T11 and L1 are chronic. The since the prior study there is a new compression fracture of L3 and further compression of L4.  IMPRESSION: 1. No acute abdominal/pelvic findings, mass lesions or adenopathy. 2. New compression fracture of L3 a since the prior CT scan and further compression of L4. Remote fractures of T8, T11 and L1. 3. Bilateral renal calculi but no obstructing ureteral calculi or bladder calculi. 4. Stable bilateral adrenal gland adenomas. 5. Stable advanced atherosclerotic calcifications involving the aorta and branch vessels. 6. Mild cystocele. 7. Stable IVC filter.   Electronically Signed   By: Marijo Sanes M.D.   On: 01/08/2015 18:16     EKG Interpretation None      MDM   Final diagnoses:  Acute pancreatitis, unspecified pancreatitis type  Abdominal pain, unspecified abdominal location    78 rolled female here with generalized crampy abdominal pain. Began this morning. No vomiting or diarrhea but is having nausea. No fever. Denies any urinary symptoms. Never had anything like this before. Here she has left upper quadrant left lower quadrant pain on exam. She is on opiates of this could be constipation. She had recent kyphoplasty scheduled but was canceled due to elevated INR. She is on Coumadin for previous DVT. Here she's got left upper and left lower quadrant pain without rebound or guarding. She also has severe lymphedema in bilateral lower extremities. She states this is normal for her. We'll check labs and CT her belly. Labs show mild elevation of lipase, normal LFTs, normal glucose and calcium. CT negative, no acute pancreatitis findings noted. Patient tolerating PO here, has  pain meds at home. She is amenable to going home. Stable for discharge  Evelina Bucy, MD 01/08/15 2350

## 2015-01-08 NOTE — ED Notes (Signed)
RN starting IV 

## 2015-01-08 NOTE — Discharge Instructions (Signed)

## 2015-01-08 NOTE — ED Notes (Signed)
Pt given applesauce and is tolerating w/o difficulty.  Pt denies N/V.

## 2015-01-08 NOTE — ED Notes (Signed)
Bed: LE17 Expected date:  Expected time:  Means of arrival:  Comments: EMS- Constipation

## 2015-01-09 ENCOUNTER — Inpatient Hospital Stay (HOSPITAL_COMMUNITY)
Admission: EM | Admit: 2015-01-09 | Discharge: 2015-01-12 | DRG: 438 | Disposition: A | Discharge status: Home Health | Payer: Medicare Other | Attending: Internal Medicine | Admitting: Internal Medicine

## 2015-01-09 ENCOUNTER — Encounter (HOSPITAL_COMMUNITY): Payer: Self-pay | Admitting: *Deleted

## 2015-01-09 ENCOUNTER — Other Ambulatory Visit: Payer: Self-pay

## 2015-01-09 ENCOUNTER — Emergency Department (HOSPITAL_COMMUNITY): Payer: Medicare Other

## 2015-01-09 DIAGNOSIS — R109 Unspecified abdominal pain: Secondary | ICD-10-CM

## 2015-01-09 DIAGNOSIS — R2681 Unsteadiness on feet: Secondary | ICD-10-CM

## 2015-01-09 DIAGNOSIS — I890 Lymphedema, not elsewhere classified: Secondary | ICD-10-CM

## 2015-01-09 DIAGNOSIS — G9340 Encephalopathy, unspecified: Secondary | ICD-10-CM

## 2015-01-09 DIAGNOSIS — R4182 Altered mental status, unspecified: Secondary | ICD-10-CM

## 2015-01-09 DIAGNOSIS — E1165 Type 2 diabetes mellitus with hyperglycemia: Secondary | ICD-10-CM

## 2015-01-09 DIAGNOSIS — L8990 Pressure ulcer of unspecified site, unspecified stage: Secondary | ICD-10-CM | POA: Insufficient documentation

## 2015-01-09 DIAGNOSIS — K859 Acute pancreatitis without necrosis or infection, unspecified: Secondary | ICD-10-CM | POA: Diagnosis present

## 2015-01-09 LAB — URINALYSIS, ROUTINE W REFLEX MICROSCOPIC
Bilirubin Urine: NEGATIVE
Glucose, UA: NEGATIVE mg/dL
Hgb urine dipstick: NEGATIVE
KETONES UR: NEGATIVE mg/dL
LEUKOCYTES UA: NEGATIVE
Nitrite: NEGATIVE
PH: 5 (ref 5.0–8.0)
PROTEIN: NEGATIVE mg/dL
Specific Gravity, Urine: 1.009 (ref 1.005–1.030)
UROBILINOGEN UA: 0.2 mg/dL (ref 0.0–1.0)

## 2015-01-09 LAB — COMPREHENSIVE METABOLIC PANEL
ALBUMIN: 3.3 g/dL — AB (ref 3.5–5.0)
ALK PHOS: 264 U/L — AB (ref 38–126)
ALT: 11 U/L — AB (ref 14–54)
AST: 18 U/L (ref 15–41)
Anion gap: 10 (ref 5–15)
BILIRUBIN TOTAL: 0.6 mg/dL (ref 0.3–1.2)
BUN: 9 mg/dL (ref 6–20)
CHLORIDE: 101 mmol/L (ref 101–111)
CO2: 28 mmol/L (ref 22–32)
Calcium: 8.8 mg/dL — ABNORMAL LOW (ref 8.9–10.3)
Creatinine, Ser: 0.87 mg/dL (ref 0.44–1.00)
GFR calc Af Amer: >60 mL/min (ref >60)
GFR calc non Af Amer: >60 mL/min (ref >60)
Glucose, Bld: 127 mg/dL — ABNORMAL HIGH (ref 65–99)
POTASSIUM: 3.2 mmol/L — AB (ref 3.5–5.1)
SODIUM: 139 mmol/L (ref 135–145)
Total Protein: 6.8 g/dL (ref 6.5–8.1)

## 2015-01-09 LAB — CBC WITH DIFFERENTIAL/PLATELET
Basophils Absolute: 0 10*3/uL (ref 0.0–0.1)
Basophils Relative: 1 % (ref 0–1)
EOS ABS: 0.2 10*3/uL (ref 0.0–0.7)
EOS PCT: 2 % (ref 0–5)
HCT: 43.1 % (ref 36.0–46.0)
HEMOGLOBIN: 13.6 g/dL (ref 12.0–15.0)
LYMPHS ABS: 2.1 10*3/uL (ref 0.7–4.0)
Lymphocytes Relative: 25 % (ref 12–46)
MCH: 27.4 pg (ref 26.0–34.0)
MCHC: 31.6 g/dL (ref 30.0–36.0)
MCV: 86.7 fL (ref 78.0–100.0)
Monocytes Absolute: 0.9 10*3/uL (ref 0.1–1.0)
Monocytes Relative: 10 % (ref 3–12)
Neutro Abs: 5.3 10*3/uL (ref 1.7–7.7)
Neutrophils Relative %: 62 % (ref 43–77)
Platelets: 377 10*3/uL (ref 150–400)
RBC: 4.97 MIL/uL (ref 3.87–5.11)
RDW: 15.5 % (ref 11.5–15.5)
WBC: 8.6 10*3/uL (ref 4.0–10.5)

## 2015-01-09 LAB — PROTIME-INR
INR: 1.49 (ref 0.00–1.49)
PROTHROMBIN TIME: 18.1 s — AB (ref 11.6–15.2)

## 2015-01-09 LAB — I-STAT TROPONIN, ED: Troponin i, poc: 0 ng/mL (ref 0.00–0.08)

## 2015-01-09 LAB — LIPASE, BLOOD: LIPASE: 14 U/L — AB (ref 22–51)

## 2015-01-09 MED ORDER — PANTOPRAZOLE SODIUM 40 MG PO TBEC
40.0000 mg | DELAYED_RELEASE_TABLET | Freq: Every day | ORAL | Status: DC
  Administered 2015-01-09: 40 mg via ORAL
  Filled 2015-01-09: qty 1

## 2015-01-09 MED ORDER — WARFARIN - PHYSICIAN DOSING INPATIENT
Freq: Every day | Status: DC
  Administered 2015-01-11: 1

## 2015-01-09 MED ORDER — WARFARIN SODIUM 3 MG PO TABS
3.5000 mg | ORAL_TABLET | Freq: Every day | ORAL | Status: DC
  Administered 2015-01-09 – 2015-01-11 (×3): 3.5 mg via ORAL
  Filled 2015-01-09 (×4): qty 1

## 2015-01-09 MED ORDER — HYDROCODONE-ACETAMINOPHEN 7.5-325 MG PO TABS
1.0000 | ORAL_TABLET | Freq: Four times a day (QID) | ORAL | Status: DC | PRN
  Administered 2015-01-09: 1 via ORAL
  Filled 2015-01-09: qty 1

## 2015-01-09 MED ORDER — ATORVASTATIN CALCIUM 20 MG PO TABS
20.0000 mg | ORAL_TABLET | Freq: Every day | ORAL | Status: DC
  Administered 2015-01-09 – 2015-01-11 (×3): 20 mg via ORAL
  Filled 2015-01-09 (×4): qty 1

## 2015-01-09 MED ORDER — MONTELUKAST SODIUM 10 MG PO TABS
10.0000 mg | ORAL_TABLET | Freq: Every day | ORAL | Status: DC
  Administered 2015-01-09 – 2015-01-11 (×3): 10 mg via ORAL
  Filled 2015-01-09 (×5): qty 1

## 2015-01-09 MED ORDER — POLYETHYLENE GLYCOL 3350 17 G PO PACK
17.0000 g | PACK | Freq: Every day | ORAL | Status: DC
  Administered 2015-01-10: 17 g via ORAL
  Filled 2015-01-09 (×3): qty 1

## 2015-01-09 MED ORDER — ACETAMINOPHEN 325 MG PO TABS: 650.0000 mg | ORAL_TABLET | ORAL | Status: DC | PRN

## 2015-01-09 MED ORDER — ONDANSETRON HCL 4 MG/2ML IJ SOLN
4.0000 mg | Freq: Four times a day (QID) | INTRAMUSCULAR | Status: DC | PRN
Start: 2015-01-09 — End: 2015-01-12

## 2015-01-09 MED ORDER — DIPHENHYDRAMINE HCL 50 MG/ML IJ SOLN
25.0000 mg | Freq: Once | INTRAMUSCULAR | Status: AC
  Administered 2015-01-09: 25 mg via INTRAVENOUS
  Filled 2015-01-09: qty 1

## 2015-01-09 MED ORDER — POTASSIUM CHLORIDE CRYS ER 20 MEQ PO TBCR
40.0000 meq | EXTENDED_RELEASE_TABLET | Freq: Once | ORAL | Status: AC
  Administered 2015-01-09: 40 meq via ORAL
  Filled 2015-01-09: qty 2

## 2015-01-09 MED ORDER — FUROSEMIDE 20 MG PO TABS
20.0000 mg | ORAL_TABLET | Freq: Every day | ORAL | Status: DC
Start: 2015-01-09 — End: 2015-01-12
  Administered 2015-01-09 – 2015-01-11 (×3): 20 mg via ORAL
  Filled 2015-01-09 (×4): qty 1

## 2015-01-09 MED ORDER — ENSURE ENLIVE PO LIQD
237.0000 mL | Freq: Two times a day (BID) | ORAL | Status: DC
  Administered 2015-01-10: 237 mL via ORAL

## 2015-01-09 MED ORDER — FENTANYL CITRATE (PF) 100 MCG/2ML IJ SOLN
50.0000 ug | Freq: Once | INTRAMUSCULAR | Status: AC
  Administered 2015-01-09: 50 ug via INTRAVENOUS
  Filled 2015-01-09: qty 2

## 2015-01-09 NOTE — ED Notes (Signed)
Patient returned from Ultrasound. 

## 2015-01-09 NOTE — ED Notes (Signed)
Pt seen at [facility] yesterday for same abd pain and diagnosed with pancreatitis. Pt denies n/v but has severe RUQ pain. Reports only given nausea meds yesterday. No acute distress noted on arrival.

## 2015-01-09 NOTE — ED Notes (Signed)
Call [name] (daughter) cell [phone] for room assignment and any concerns

## 2015-01-09 NOTE — Progress Notes (Signed)
Karen Waters [id number] Admitted to 2K46: 01/09/2015 9:06 PM Attending Provider: Gennaro Africa, MD    Karen Waters is a 64 y.o. female patient admitted from ED awake, alert  & orientated  X 3,  Full Code, VSS - Blood pressure 131/61, pulse 90, temperature 98.2 F (36.8 C), temperature source Oral, resp. rate 18, height 5\' 4"  (1.626 m), weight 94.62 kg (208 lb 9.6 oz), SpO2 92 %., no c/o shortness of breath, no c/o chest pain, no distress noted. Tele # [id number] placed and pt is currently running: SR   IV site WDL:  with a transparent dsg that's clean dry and intact.  Allergies:   Allergies  Allergen Reactions  . Darvon [Propoxyphene] Other (See Comments)    REACTIONS: hallucinations  . Percocet [Oxycodone-Acetaminophen] Other (See Comments)    REACTION: hallucinations  . Valium [Diazepam] Other (See Comments)    REACTION: hallucinations     Past Medical History  Diagnosis Date  . Uterine cancer   . DVT (deep venous thrombosis)   . Lymphedema of both lower extremities   . Obesity   . Tobacco abuse   . Arthritis   . Fractures     compression (2)  . Family history of adverse reaction to anesthesia     pt. states that brother and dad become aggressive when trying to wake up  . Diabetes mellitus without complication     pt. states that she no longer has diabetes  . Hypertension     pt. states history of  . COPD (chronic obstructive pulmonary disease)     pt. states that one doctor says she does but one doctor says no  . History of pneumonia   . History of bronchitis   . Urinary incontinence   . GERD (gastroesophageal reflux disease)     History:  obtained from patient  Pt orientation to unit, room and routine. Information packet given to patient and safety video refused.  Admission INP armband ID verified with patient, and in place. SR up x 2, fall risk assessment complete with Patient verbalizing understanding of risks associated with falls. Pt verbalizes an understanding of  how to use the call bell and to call for help before getting out of bed.  Skin- see flowsheet.  Will cont to monitor and assist as needed.  Parthenia Ames, RN 01/09/2015 9:06 PM

## 2015-01-09 NOTE — ED Provider Notes (Signed)
  5:29 PM Patient signed out to me at change of shift by Dr Regenia Skeeter.  Pt seen in ED last night with abdominal pain, negative CT scan, returns today c/o flank pain, R>L.  Pending UA, RUQ [place].  Has hydrocodone at home. If negative, pt may be d/c home.    6:42 PM Pain free currently.  Discussed results with patient and family members.  Also discussed pt and current results with Dr Mingo Amber who saw the patient last night and will also see the patient in ED now.    Pt admitted by Dr Mingo Amber.  See his note for further details.   Clayton Bibles, PA-C 01/09/15 [place], MD 01/11/15 [phone]

## 2015-01-09 NOTE — ED Notes (Signed)
Patient reported feeling like she can't catch her breath after Fentanyl given. Lung fields bilaterally clear without wheezing. No change in vital signs. Sats on room air unchanged at 96%. Patient states feels "weird" and "does not like that medication." States intermittently feels "fine" and then when she tried to relax, the tight throat feeling starts. Reassured patient and notified [place], [place].

## 2015-01-09 NOTE — ED Provider Notes (Signed)
CSN: [id number]     Arrival date & time 01/09/15  1241 History   First MD Initiated Contact with Patient 01/09/15 1507     Chief Complaint  Patient presents with  . Abdominal Pain     (Consider location/radiation/quality/duration/timing/severity/associated sxs/prior Treatment) HPI  64 year old female presents with continued and worsening left and right flank pain. She states this started 4 days ago. She was seen at [facility] long yesterday where she was diagnosed with pancreatitis. There she had a negative CT scan. The patient states her pain was better and she went home. Has not had any vomiting since she left. Pain today has become more severe, right worse than left. No urinary symptoms. No fevers or chills. Has chronic back pain that is not worse than typical.  Past Medical History  Diagnosis Date  . Uterine cancer   . DVT (deep venous thrombosis)   . Lymphedema of both lower extremities   . Obesity   . Tobacco abuse   . Arthritis   . Fractures     compression (2)  . Family history of adverse reaction to anesthesia     pt. states that brother and dad become aggressive when trying to wake up  . Diabetes mellitus without complication     pt. states that she no longer has diabetes  . Hypertension     pt. states history of  . COPD (chronic obstructive pulmonary disease)     pt. states that one doctor says she does but one doctor says no  . History of pneumonia   . History of bronchitis   . Urinary incontinence   . GERD (gastroesophageal reflux disease)    Past Surgical History  Procedure Laterality Date  . Tubal ligation    . Abdominal hysterectomy    . Back surgery    . Cystocele repair    . Colonoscopy    . Esophagogastroduodenoscopy    . Eye surgery Bilateral     cataracts  . Intraoperative arteriogram     Family History  Problem Relation Age of Onset  . Hypertension     History  Substance Use Topics  . Smoking status: Former Smoker -- 0.75 packs/day for 47 years     Types: Cigarettes    Quit date: 12/26/2014  . Smokeless tobacco: Never Used  . Alcohol Use: No   OB History    No data available     Review of Systems  Constitutional: Negative for fever.  Cardiovascular: Positive for leg swelling (chronic).  Gastrointestinal: Negative for vomiting.  Genitourinary: Positive for flank pain. Negative for dysuria and hematuria.  Musculoskeletal: Positive for back pain.  All other systems reviewed and are negative.     Allergies  Darvon; Percocet; and Valium  Home Medications   Prior to Admission medications   Medication Sig Start Date End Date Taking? Authorizing Provider  atorvastatin (LIPITOR) 20 MG tablet Take 20 mg by mouth daily at 6 PM.  11/11/13   Historical Provider, MD  DEXILANT 60 MG capsule Take 60 mg by mouth daily.  11/11/13   Historical Provider, MD  fluticasone (FLONASE) 50 MCG/ACT nasal spray Place 2 sprays into both nostrils daily as needed for allergies (allergies).  11/11/13   Historical Provider, MD  FORTEO 600 MCG/2.4ML SOLN Inject 20 mcg into the skin every evening.  01/07/15   Historical Provider, MD  furosemide (LASIX) 40 MG tablet Take 20 mg by mouth daily.  12/08/14   Historical Provider, MD  HYDROcodone-acetaminophen ([place]) 7.5-325 MG  per tablet Take 1 tablet by mouth every 6 (six) hours as needed for moderate pain.    Historical Provider, MD  methocarbamol (ROBAXIN) 500 MG tablet Take 1 tablet (500 mg total) by mouth 2 (two) times daily as needed for muscle spasms. Patient not taking: Reported on 01/08/2015 12/04/14   Noemi Chapel, MD  montelukast (SINGULAIR) 10 MG tablet Take 10 mg by mouth at bedtime.  11/11/13   Historical Provider, MD  mupirocin ointment (BACTROBAN) 2 % Apply 1 application topically daily.  12/29/14   Historical Provider, MD  polyethylene glycol (MIRALAX / GLYCOLAX) packet Take 17 g by mouth daily.    Historical Provider, MD  Vitamin D, Ergocalciferol, (DRISDOL) 50000 UNITS CAPS capsule Take 1 capsule  (50,000 Units total) by mouth every 7 (seven) days. Patient taking differently: Take 50,000 Units by mouth every Monday.  06/15/14   Dellia Nims, MD  warfarin (COUMADIN) 1 MG tablet Take 3.5 mg by mouth daily.    Historical Provider, MD   BP 131/63 mmHg  Pulse 91  Temp(Src) 98.5 F (36.9 C) (Oral)  Resp 18  SpO2 93% Physical Exam  Constitutional: She is oriented to person, place, and time. She appears well-developed and well-nourished.  obese  HENT:  Head: Normocephalic and atraumatic.  Right Ear: External ear normal.  Left Ear: External ear normal.  Nose: Nose normal.  Eyes: Right eye exhibits no discharge. Left eye exhibits no discharge.  Cardiovascular: Normal rate, regular rhythm and normal heart sounds.   Pulmonary/Chest: Effort normal and breath sounds normal.  Abdominal: Soft. There is no tenderness.  Exam limited by obesity. Some left CVA tenderness, more tenderness over right CVA  Musculoskeletal: She exhibits edema (chronic lymphedema bilaterally).  Neurological: She is alert and oriented to person, place, and time.  Skin: Skin is warm and dry.  Nursing note and vitals reviewed.   ED Course  Procedures (including critical care time) Labs Review Labs Reviewed  COMPREHENSIVE METABOLIC PANEL - Abnormal; Notable for the following:    Potassium 3.2 (*)    Glucose, Bld 127 (*)    Calcium 8.8 (*)    Albumin 3.3 (*)    ALT 11 (*)    Alkaline Phosphatase 264 (*)    All other components within normal limits  LIPASE, BLOOD - Abnormal; Notable for the following:    Lipase 14 (*)    All other components within normal limits  PROTIME-INR - Abnormal; Notable for the following:    Prothrombin Time 18.1 (*)    All other components within normal limits  CBC WITH DIFFERENTIAL/PLATELET  URINALYSIS, ROUTINE W REFLEX MICROSCOPIC (NOT AT [facility])    Imaging Review Ct Abdomen Pelvis W Contrast  01/08/2015   CLINICAL DATA:  Generalized abdominal pain and constipation.  EXAM: CT  ABDOMEN AND PELVIS WITH CONTRAST  TECHNIQUE: Multidetector CT imaging of the abdomen and pelvis was performed using the standard protocol following bolus administration of intravenous contrast.  CONTRAST:  140mL OMNIPAQUE IOHEXOL 300 MG/ML SOLN, 32mL OMNIPAQUE IOHEXOL 300 MG/ML SOLN  COMPARISON:  12/04/2014  FINDINGS: Lower chest: The lung bases are clear of acute process. Minimal scarring and/or atelectasis. The heart is normal in size. No pericardial effusion. Coronary artery calcifications are noted. The distal esophagus is grossly normal.  Hepatobiliary: No focal hepatic lesions or intrahepatic biliary dilatation. The gallbladder is normal. No common bile duct dilatation.  Pancreas: Moderate atrophy but no mass or inflammation.  Spleen: Normal size.  No focal lesions.  Adrenals/Urinary Tract: Stable bilateral  adrenal gland adenomas.  Bilateral renal calculi but no obstructing ureteral calculi or bladder calculi. Both kidneys demonstrate normal enhancement/ perfusion. No worrisome renal lesions. The delayed images do not demonstrate any significant collecting system abnormality.  Stomach/Bowel: The stomach, duodenum, small bowel and colon are unremarkable. No inflammatory changes, mass lesions or obstructive findings.  Vascular/Lymphatic: Advanced atherosclerotic calcifications involving the aorta and branch vessels but no focal aneurysm. The major venous structures are patent. Stable IVC filter.  Other: The uterus is surgically absent. There is a mild cystocele noted. No pelvic mass or adenopathy. No free pelvic fluid collections. No inguinal mass or adenopathy.  Musculoskeletal: Numerous thoracic and lumbar compression fractures. T8, T11 and L1 are chronic. The since the prior study there is a new compression fracture of L3 and further compression of L4.  IMPRESSION: 1. No acute abdominal/pelvic findings, mass lesions or adenopathy. 2. New compression fracture of L3 a since the prior CT scan and further  compression of L4. Remote fractures of T8, T11 and L1. 3. Bilateral renal calculi but no obstructing ureteral calculi or bladder calculi. 4. Stable bilateral adrenal gland adenomas. 5. Stable advanced atherosclerotic calcifications involving the aorta and branch vessels. 6. Mild cystocele. 7. Stable IVC filter.   Electronically Signed   By: Marijo Sanes M.D.   On: 01/08/2015 18:16     EKG Interpretation None      MDM   Final diagnoses:  None    CT scan and lab work from yesterday have been reviewed. Given the patient's pain is more flank, I will obtain an ultrasound to rule out kidney and gallbladder disease. Otherwise this would be more musculoskeletal. I do not see a urine from yesterday and thus urinalysis be sent to rule out pyelo as the cause of her pain. Otherwise there has been no vomiting and thus if her workup is unremarkable she may be discharged home. Care transferred to Dr. Mingo Amber.    Sherwood Gambler, MD 01/09/15 [phone]

## 2015-01-09 NOTE — ED Notes (Signed)
Patient transported to Ultrasound 

## 2015-01-09 NOTE — ED Notes (Signed)
Unable to locate her dentures.  Talking on the phone with her daughter while going to the floor

## 2015-01-09 NOTE — ED Notes (Signed)
Unable to give report at this time.

## 2015-01-10 ENCOUNTER — Inpatient Hospital Stay (HOSPITAL_COMMUNITY): Payer: Medicare Other

## 2015-01-10 DIAGNOSIS — Z6835 Body mass index (BMI) 35.0-35.9, adult: Secondary | ICD-10-CM | POA: Diagnosis not present

## 2015-01-10 DIAGNOSIS — Z86718 Personal history of other venous thrombosis and embolism: Secondary | ICD-10-CM | POA: Diagnosis not present

## 2015-01-10 DIAGNOSIS — G9340 Encephalopathy, unspecified: Secondary | ICD-10-CM

## 2015-01-10 DIAGNOSIS — K859 Acute pancreatitis, unspecified: Secondary | ICD-10-CM | POA: Diagnosis present

## 2015-01-10 DIAGNOSIS — R296 Repeated falls: Secondary | ICD-10-CM | POA: Diagnosis present

## 2015-01-10 DIAGNOSIS — R2681 Unsteadiness on feet: Secondary | ICD-10-CM

## 2015-01-10 DIAGNOSIS — Z87891 Personal history of nicotine dependence: Secondary | ICD-10-CM | POA: Diagnosis not present

## 2015-01-10 DIAGNOSIS — E785 Hyperlipidemia, unspecified: Secondary | ICD-10-CM | POA: Diagnosis present

## 2015-01-10 DIAGNOSIS — I2510 Atherosclerotic heart disease of native coronary artery without angina pectoris: Secondary | ICD-10-CM | POA: Diagnosis present

## 2015-01-10 DIAGNOSIS — L8990 Pressure ulcer of unspecified site, unspecified stage: Secondary | ICD-10-CM | POA: Insufficient documentation

## 2015-01-10 DIAGNOSIS — J449 Chronic obstructive pulmonary disease, unspecified: Secondary | ICD-10-CM | POA: Diagnosis present

## 2015-01-10 DIAGNOSIS — I890 Lymphedema, not elsewhere classified: Secondary | ICD-10-CM | POA: Diagnosis not present

## 2015-01-10 DIAGNOSIS — Z79899 Other long term (current) drug therapy: Secondary | ICD-10-CM | POA: Diagnosis not present

## 2015-01-10 DIAGNOSIS — E119 Type 2 diabetes mellitus without complications: Secondary | ICD-10-CM | POA: Diagnosis present

## 2015-01-10 DIAGNOSIS — E1165 Type 2 diabetes mellitus with hyperglycemia: Secondary | ICD-10-CM | POA: Diagnosis not present

## 2015-01-10 DIAGNOSIS — R32 Unspecified urinary incontinence: Secondary | ICD-10-CM | POA: Diagnosis present

## 2015-01-10 DIAGNOSIS — Z8542 Personal history of malignant neoplasm of other parts of uterus: Secondary | ICD-10-CM | POA: Diagnosis not present

## 2015-01-10 DIAGNOSIS — R269 Unspecified abnormalities of gait and mobility: Secondary | ICD-10-CM | POA: Diagnosis present

## 2015-01-10 DIAGNOSIS — E669 Obesity, unspecified: Secondary | ICD-10-CM | POA: Diagnosis present

## 2015-01-10 DIAGNOSIS — Z7401 Bed confinement status: Secondary | ICD-10-CM | POA: Diagnosis not present

## 2015-01-10 DIAGNOSIS — G8929 Other chronic pain: Secondary | ICD-10-CM | POA: Diagnosis present

## 2015-01-10 DIAGNOSIS — E876 Hypokalemia: Secondary | ICD-10-CM | POA: Diagnosis present

## 2015-01-10 DIAGNOSIS — K219 Gastro-esophageal reflux disease without esophagitis: Secondary | ICD-10-CM | POA: Diagnosis present

## 2015-01-10 DIAGNOSIS — Z7901 Long term (current) use of anticoagulants: Secondary | ICD-10-CM | POA: Diagnosis not present

## 2015-01-10 DIAGNOSIS — I10 Essential (primary) hypertension: Secondary | ICD-10-CM | POA: Diagnosis present

## 2015-01-10 DIAGNOSIS — R1013 Epigastric pain: Secondary | ICD-10-CM | POA: Diagnosis present

## 2015-01-10 LAB — COMPREHENSIVE METABOLIC PANEL
ALBUMIN: 2.9 g/dL — AB (ref 3.5–5.0)
ALT: 9 U/L — ABNORMAL LOW (ref 14–54)
ANION GAP: 10 (ref 5–15)
AST: 14 U/L — ABNORMAL LOW (ref 15–41)
Alkaline Phosphatase: 211 U/L — ABNORMAL HIGH (ref 38–126)
BILIRUBIN TOTAL: 0.8 mg/dL (ref 0.3–1.2)
BUN: 9 mg/dL (ref 6–20)
CALCIUM: 8.6 mg/dL — AB (ref 8.9–10.3)
CO2: 26 mmol/L (ref 22–32)
CREATININE: 0.9 mg/dL (ref 0.44–1.00)
Chloride: 103 mmol/L (ref 101–111)
GFR calc Af Amer: >60 mL/min (ref >60)
GFR calc non Af Amer: >60 mL/min (ref >60)
GLUCOSE: 126 mg/dL — AB (ref 65–99)
Potassium: 3.7 mmol/L (ref 3.5–5.1)
SODIUM: 139 mmol/L (ref 135–145)
Total Protein: 6 g/dL — ABNORMAL LOW (ref 6.5–8.1)

## 2015-01-10 LAB — GLUCOSE, CAPILLARY: Glucose-Capillary: 208 mg/dL — ABNORMAL HIGH (ref 65–99)

## 2015-01-10 LAB — SEDIMENTATION RATE: Sed Rate: 45 mm/hr — ABNORMAL HIGH (ref 0–22)

## 2015-01-10 LAB — TSH: TSH: 1.339 u[IU]/mL (ref 0.350–4.500)

## 2015-01-10 LAB — VITAMIN B12: VITAMIN B 12: 555 pg/mL (ref 180–914)

## 2015-01-10 LAB — PROTIME-INR
INR: 1.72 — ABNORMAL HIGH (ref 0.00–1.49)
PROTHROMBIN TIME: 20.1 s — AB (ref 11.6–15.2)

## 2015-01-10 LAB — AMMONIA: AMMONIA: 32 umol/L (ref 9–35)

## 2015-01-10 LAB — TROPONIN I: Troponin I: <0.03 ng/mL (ref <0.031)

## 2015-01-10 MED ORDER — SODIUM CHLORIDE 0.9 % IV SOLN
INTRAVENOUS | Status: DC
  Administered 2015-01-10 – 2015-01-11 (×4): via INTRAVENOUS
  Administered 2015-01-11: 1000 mL via INTRAVENOUS
  Administered 2015-01-11 – 2015-01-12 (×2): via INTRAVENOUS

## 2015-01-10 MED ORDER — LORAZEPAM 2 MG/ML IJ SOLN
1.0000 mg | Freq: Once | INTRAMUSCULAR | Status: AC
  Administered 2015-01-10: 1 mg via INTRAVENOUS
  Filled 2015-01-10: qty 1

## 2015-01-10 MED ORDER — HALOPERIDOL LACTATE 5 MG/ML IJ SOLN: 5.0000 mg | Freq: Four times a day (QID) | INTRAMUSCULAR | Status: DC | PRN

## 2015-01-10 MED ORDER — INSULIN ASPART 100 UNIT/ML ~~LOC~~ SOLN
0.0000 [IU] | Freq: Three times a day (TID) | SUBCUTANEOUS | Status: DC
  Administered 2015-01-11 – 2015-01-12 (×3): 1 [IU] via SUBCUTANEOUS

## 2015-01-10 MED ORDER — BOOST / RESOURCE BREEZE PO LIQD
1.0000 | Freq: Three times a day (TID) | ORAL | Status: DC
  Administered 2015-01-10 – 2015-01-11 (×2): 1 via ORAL

## 2015-01-10 MED ORDER — INSULIN ASPART 100 UNIT/ML ~~LOC~~ SOLN
0.0000 [IU] | Freq: Every day | SUBCUTANEOUS | Status: DC
Start: 2015-01-10 — End: 2015-01-12
  Administered 2015-01-10: 2 [IU] via SUBCUTANEOUS

## 2015-01-10 MED ORDER — PANTOPRAZOLE SODIUM 40 MG IV SOLR
40.0000 mg | INTRAVENOUS | Status: DC
  Administered 2015-01-10 – 2015-01-12 (×3): 40 mg via INTRAVENOUS
  Filled 2015-01-10 (×5): qty 40

## 2015-01-10 MED ORDER — ADULT MULTIVITAMIN W/MINERALS CH
1.0000 | ORAL_TABLET | Freq: Every day | ORAL | Status: DC
  Administered 2015-01-11: 1 via ORAL
  Filled 2015-01-10 (×2): qty 1

## 2015-01-10 NOTE — Progress Notes (Signed)
Pt has 2 skin tears on her RFA. Each covered with small pink foam. Will continue to monitor.

## 2015-01-10 NOTE — Progress Notes (Signed)
Physical Therapy Treatment Patient Details Name: Analee Montee MRN: [id number] DOB: January 19, 1951 Today's Date: 01/11/15    History of Present Illness Khamille Beynon is a 64 y.o. female with history of DVT who was admitted for abdominal pain    PT Comments    Attempting to perform evaluation but patient unable to participate due to lethargy. Unable to arouse enough to participate in PT. Will check back as able.   Follow Up Recommendations        Equipment Recommendations          Time: 6811-5726 PT Time Calculation (min) (ACUTE ONLY): 3 min  Charges:                       G CodesLinard Millers 01/11/15, 3:38 PM

## 2015-01-10 NOTE — H&P (Signed)
History and Physical  Karen Waters TLX:[id number] DOB: 31-Aug-1950 DOA: 01/09/2015  PCP: Alvester Chou, NP   Chief Complaint: abdominal pain  HPI: Karen Waters is a 64 y.o. female with history of DVT who was admitted for abdominal pain. Pain started Friday evening : epigastric, constant, increased by cough, with no chest pain or dyspnea or cough. She denied nausea, vomiting, diarrhea or constipation. She had no appetite. She was seen yesterday at [facility] and was sent home from ER as she was stable and able to tolerate PO but she said she continues to have pain and has no appetite because of it.   Review of Systems:  12 poin Review of systems are positive for mild headaches . Otherwise negative  Past Medical History  Diagnosis Date  . Uterine cancer   . DVT (deep venous thrombosis)   . Lymphedema of both lower extremities   . Obesity   . Tobacco abuse   . Arthritis   . Fractures     compression (2)  . Family history of adverse reaction to anesthesia     pt. states that brother and dad become aggressive when trying to wake up  . Diabetes mellitus without complication     pt. states that she no longer has diabetes  . Hypertension     pt. states history of  . COPD (chronic obstructive pulmonary disease)     pt. states that one doctor says she does but one doctor says no  . History of pneumonia   . History of bronchitis   . Urinary incontinence   . GERD (gastroesophageal reflux disease)    Past Surgical History  Procedure Laterality Date  . Tubal ligation    . Abdominal hysterectomy    . Back surgery    . Cystocele repair    . Colonoscopy    . Esophagogastroduodenoscopy    . Eye surgery Bilateral     cataracts  . Intraoperative arteriogram     Social History:  reports that she quit smoking about 2 weeks ago. Her smoking use included Cigarettes. She has a 35.25 pack-year smoking history. She has never used smokeless tobacco. She reports that she does not drink alcohol or use  illicit drugs.  Allergies  Allergen Reactions  . Darvon [Propoxyphene] Other (See Comments)    REACTIONS: hallucinations  . Percocet [Oxycodone-Acetaminophen] Other (See Comments)    REACTION: hallucinations  . Valium [Diazepam] Other (See Comments)    REACTION: hallucinations    Family History  Problem Relation Age of Onset  . Hypertension       Prior to Admission medications   Medication Sig Start Date End Date Taking? Authorizing Provider  atorvastatin (LIPITOR) 20 MG tablet Take 20 mg by mouth daily at 6 PM.  11/11/13   Historical Provider, MD  DEXILANT 60 MG capsule Take 60 mg by mouth daily.  11/11/13   Historical Provider, MD  fluticasone (FLONASE) 50 MCG/ACT nasal spray Place 2 sprays into both nostrils daily as needed for allergies (allergies).  11/11/13   Historical Provider, MD  FORTEO 600 MCG/2.4ML SOLN Inject 20 mcg into the skin every evening.  01/07/15   Historical Provider, MD  furosemide (LASIX) 40 MG tablet Take 20 mg by mouth daily.  12/08/14   Historical Provider, MD  HYDROcodone-acetaminophen (NORCO) 7.5-325 MG per tablet Take 1 tablet by mouth every 6 (six) hours as needed for moderate pain.    Historical Provider, MD  methocarbamol (ROBAXIN) 500 MG tablet Take 1 tablet (500 mg  total) by mouth 2 (two) times daily as needed for muscle spasms. Patient not taking: Reported on 01/08/2015 12/04/14   Noemi Chapel, MD  montelukast (SINGULAIR) 10 MG tablet Take 10 mg by mouth at bedtime.  11/11/13   Historical Provider, MD  mupirocin ointment (BACTROBAN) 2 % Apply 1 application topically daily.  12/29/14   Historical Provider, MD  polyethylene glycol (MIRALAX / GLYCOLAX) packet Take 17 g by mouth daily.    Historical Provider, MD  Vitamin D, Ergocalciferol, (DRISDOL) 50000 UNITS CAPS capsule Take 1 capsule (50,000 Units total) by mouth every 7 (seven) days. Patient taking differently: Take 50,000 Units by mouth every Monday.  06/15/14   Dellia Nims, MD  warfarin (COUMADIN) 1 MG  tablet Take 3.5 mg by mouth daily.    Historical Provider, MD    Physical Exam: BP 131/61 mmHg  Pulse 90  Temp(Src) 98.2 F (36.8 C) (Oral)  Resp 18  Ht 5\' 4"  (1.626 m)  Wt 94.62 kg (208 lb 9.6 oz)  BMI 35.79 kg/m2  SpO2 92%  General:  In NAD Eyes:  Non icteric.  ENT: normal oropharyngeal mucosa.  Neck: supple.  Cardiovascular: RRR. No M/G/R. Respiratory: CTAB Abdomen: soft, mild tenderness to palpation, no rebound tenderness, BS+ Skin: no rash.  Musculoskeletal: full ROM.  Neurologic:A, O#3, no focal deficits.          Labs on Admission:  Basic Metabolic Panel:  Recent Labs Lab 01/08/15 1601 01/09/15 1524  NA 140 139  K 4.1 3.2*  CL 105 101  CO2 25 28  GLUCOSE 115* 127*  BUN 13 9  CREATININE 0.64 0.87  CALCIUM 9.1 8.8*   Liver Function Tests:  Recent Labs Lab 01/08/15 1601 01/09/15 1524  AST 22 18  ALT 8* 11*  ALKPHOS 228* 264*  BILITOT 1.1 0.6  PROT 6.8 6.8  ALBUMIN 3.5 3.3*    Recent Labs Lab 01/08/15 1601 01/09/15 1524  LIPASE 166* 14*   No results for input(s): AMMONIA in the last 168 hours. CBC:  Recent Labs Lab 01/08/15 1601 01/09/15 1524  WBC 10.7* 8.6  NEUTROABS  --  5.3  HGB 13.4 13.6  HCT 42.7 43.1  MCV 87.9 86.7  PLT 357 377   Cardiac Enzymes: No results for input(s): CKTOTAL, CKMB, CKMBINDEX, TROPONINI in the last 168 hours.  BNP (last 3 results) No results for input(s): BNP in the last 8760 hours.  ProBNP (last 3 results)  Recent Labs  06/12/14 1906  PROBNP 235.8*    CBG: No results for input(s): GLUCAP in the last 168 hours.  Radiological Exams on Admission: Ct Abdomen Pelvis W Contrast  01/08/2015   CLINICAL DATA:  Generalized abdominal pain and constipation.  EXAM: CT ABDOMEN AND PELVIS WITH CONTRAST  TECHNIQUE: Multidetector CT imaging of the abdomen and pelvis was performed using the standard protocol following bolus administration of intravenous contrast.  CONTRAST:  189mL OMNIPAQUE IOHEXOL 300 MG/ML  SOLN, 74mL OMNIPAQUE IOHEXOL 300 MG/ML SOLN  COMPARISON:  12/04/2014  FINDINGS: Lower chest: The lung bases are clear of acute process. Minimal scarring and/or atelectasis. The heart is normal in size. No pericardial effusion. Coronary artery calcifications are noted. The distal esophagus is grossly normal.  Hepatobiliary: No focal hepatic lesions or intrahepatic biliary dilatation. The gallbladder is normal. No common bile duct dilatation.  Pancreas: Moderate atrophy but no mass or inflammation.  Spleen: Normal size.  No focal lesions.  Adrenals/Urinary Tract: Stable bilateral adrenal gland adenomas.  Bilateral renal calculi but no obstructing  ureteral calculi or bladder calculi. Both kidneys demonstrate normal enhancement/ perfusion. No worrisome renal lesions. The delayed images do not demonstrate any significant collecting system abnormality.  Stomach/Bowel: The stomach, duodenum, small bowel and colon are unremarkable. No inflammatory changes, mass lesions or obstructive findings.  Vascular/Lymphatic: Advanced atherosclerotic calcifications involving the aorta and branch vessels but no focal aneurysm. The major venous structures are patent. Stable IVC filter.  Other: The uterus is surgically absent. There is a mild cystocele noted. No pelvic mass or adenopathy. No free pelvic fluid collections. No inguinal mass or adenopathy.  Musculoskeletal: Numerous thoracic and lumbar compression fractures. T8, T11 and L1 are chronic. The since the prior study there is a new compression fracture of L3 and further compression of L4.  IMPRESSION: 1. No acute abdominal/pelvic findings, mass lesions or adenopathy. 2. New compression fracture of L3 a since the prior CT scan and further compression of L4. Remote fractures of T8, T11 and L1. 3. Bilateral renal calculi but no obstructing ureteral calculi or bladder calculi. 4. Stable bilateral adrenal gland adenomas. 5. Stable advanced atherosclerotic calcifications involving the  aorta and branch vessels. 6. Mild cystocele. 7. Stable IVC filter.   Electronically Signed   By: Marijo Sanes M.D.   On: 01/08/2015 18:16   US Abdomen Limited  01/09/2015   CLINICAL DATA:  Right upper quadrant abdominal pain. Hypertension. Diabetes.  EXAM: US ABDOMEN LIMITED - RIGHT UPPER QUADRANT  COMPARISON:  CT scan of 01/08/2015  FINDINGS: Gallbladder:  The small amount of sludge observed dependently in the gallbladder. No definite gallstone. No gallbladder wall thickening. Sonographic Murphy's sign absent.  Common bile duct:  Diameter: 4 mm, within normal limits.  Liver:  No focal lesion identified. Within normal limits in parenchymal echogenicity. Reduced sensitivity due to difficulty with sonic windows.  On focused review of recent CT scans, it is my impression that the patient has a healing lower sternal fracture.  IMPRESSION: 1. Small amount of sludge in the gallbladder, without gallbladder wall thickening, pericholecystic fluid, or sonographic Murphy's sign. 2. Serial review of recent CT scans makes me suspect that the patient probably has a healing lower sternal fracture, which could possibly be contributing to pain in this vicinity. 3. Characterization of the liver is slightly limited due to poor sonic windows, but on yesterday's CT scan no specific hepatic abnormality was observed.   Electronically Signed   By: Van Clines M.D.   On: 01/09/2015 17:21    EKG: Independently reviewed. No ischemic changes.   Assessment/Plan  Pancreatitis:  Likely cause of abdominal pain NPO PPI PO Zofran prn N/V IVF NS 100 cc/hr  H/o DM:  patient said she is off meds.   HLD:  continue Lipitor  H/o DVT: patient said she was told to stop coumadin due to coming surgery but then stated her surgery is due in couple of weeks. INR in sub therapeutic. No indication for bridging. Will restart coumadin and check regarding surgery plan in am.  Hypokalemia: replaced.   Code Status: full code    Family Communication: None at beside.   Disposition Plan: Obs. Dc when tolerating PO.  [facility]

## 2015-01-10 NOTE — Progress Notes (Signed)
PROGRESS NOTE  Karen Waters KGU:[id number] DOB: [birth year] DOA: 01/09/2015 PCP: Alvester Chou, NP  Brief history 64 year old female with a history of hypertension, lower extremity DVT, COPD, continued tobacco use, diabetes mellitus, and lymphedema of her lower extremities presented with epigastric and left flank pain. Workup essentially has been unremarkable. Initially, the patient was thought to have pancreatitis with a lipase of 166. She initially presented to [facility] hospital on 01/08/2015. CT of the abdomen and pelvis was negative for any pancreatic inflammation.  There were no acute abnormalities. The patient was discharged from the emergency department. She represented to [facility] emergency department with left flank pain. Urinalysis was negative for hematuria or pyuria. The patient was admitted for abdominal pain. However, upon speaking with the patient's daughter, it appears that the patient has had increasing confusion over the last 3-4 days. Her daughter states that the patient has had a cognitive and functional decline over the past 15-16 months. At baseline, the patient is essentially bedbound with assistance with transfers to a bedside commode. History has been difficult to obtain from the patient as she is confused. The patient contradicts herself when complaining of pain. Assessment/Plan: Acute encephalopathy -CT brain without contrast -Serum B12 -TSH -Ammonia -Urine drug screen -RBC folate -ESR -Apparently, there was concern for possible temporal arteritis back in December 2015, but the patient never followed up for her temporal artery biopsy. Presently she denies any headache or eye pain. The patient was placed on prednisone empirically, but has been taken off for at least 3 months now Diabetes mellitus type 2 -12/29/2014 hemoglobin A1c 9.2 -According to the patient's daughter, the patient was taken off her prednisone a few months ago because of concerns for osteoporosis and her  compression fractures -She has not taken any insulin for over one month -NovoLog sliding-scale while monitoring her CBGs Gait instability/frequent falls - Patient presented with mechanical fall in March 2016 resulting in a distal radius fracture on the right  -PT evaluation  -Suspect patient needs skilled nursing facility  Abdominal pain  -Unclear if the patient is truly having any pain  -She frequently to contradict himself with regard to complaints of pain  -01/08/2015 CT abdomen and pelvis negative for any acute intra-abdominal abnormalities  History of lower extremity DVT  -Continue warfarin for now  -Patient has IVC filter  Coronary artery disease  -3 vessel calcification noted on CT  -Continue statin  Chronic lymphedema  - 2/2 to lymph node exicision for her uterine cancer per patient. was taking lasix for it. Will not give her that since she is not volume overloaded on exam, just her lymphadedema which is not treated best with lasix.   Family Communication:   Daughter updated on phone--time spent 45 min, >50% spent counseling and coordinating care Disposition Plan:   SNF if family/pt agreeable      Procedures/Studies: Ct Abdomen Pelvis W Contrast  01/08/2015   CLINICAL DATA:  Generalized abdominal pain and constipation.  EXAM: CT ABDOMEN AND PELVIS WITH CONTRAST  TECHNIQUE: Multidetector CT imaging of the abdomen and pelvis was performed using the standard protocol following bolus administration of intravenous contrast.  CONTRAST:  141m OMNIPAQUE IOHEXOL 300 MG/ML SOLN, 574mOMNIPAQUE IOHEXOL 300 MG/ML SOLN  COMPARISON:  12/04/2014  FINDINGS: Lower chest: The lung bases are clear of acute process. Minimal scarring and/or atelectasis. The heart is normal in size. No pericardial effusion. Coronary artery calcifications are noted. The distal esophagus is grossly normal.  Hepatobiliary: No focal hepatic lesions or intrahepatic biliary dilatation. The gallbladder is normal. No common  bile duct dilatation.  Pancreas: Moderate atrophy but no mass or inflammation.  Spleen: Normal size.  No focal lesions.  Adrenals/Urinary Tract: Stable bilateral adrenal gland adenomas.  Bilateral renal calculi but no obstructing ureteral calculi or bladder calculi. Both kidneys demonstrate normal enhancement/ perfusion. No worrisome renal lesions. The delayed images do not demonstrate any significant collecting system abnormality.  Stomach/Bowel: The stomach, duodenum, small bowel and colon are unremarkable. No inflammatory changes, mass lesions or obstructive findings.  Vascular/Lymphatic: Advanced atherosclerotic calcifications involving the aorta and branch vessels but no focal aneurysm. The major venous structures are patent. Stable IVC filter.  Other: The uterus is surgically absent. There is a mild cystocele noted. No pelvic mass or adenopathy. No free pelvic fluid collections. No inguinal mass or adenopathy.  Musculoskeletal: Numerous thoracic and lumbar compression fractures. T8, T11 and L1 are chronic. The since the prior study there is a new compression fracture of L3 and further compression of L4.  IMPRESSION: 1. No acute abdominal/pelvic findings, mass lesions or adenopathy. 2. New compression fracture of L3 a since the prior CT scan and further compression of L4. Remote fractures of T8, T11 and L1. 3. Bilateral renal calculi but no obstructing ureteral calculi or bladder calculi. 4. Stable bilateral adrenal gland adenomas. 5. Stable advanced atherosclerotic calcifications involving the aorta and branch vessels. 6. Mild cystocele. 7. Stable IVC filter.   Electronically Signed   By: Marijo Sanes M.D.   On: 01/08/2015 18:16   US Abdomen Limited  01/09/2015   CLINICAL DATA:  Right upper quadrant abdominal pain. Hypertension. Diabetes.  EXAM: US ABDOMEN LIMITED - RIGHT UPPER QUADRANT  COMPARISON:  CT scan of 01/08/2015  FINDINGS: Gallbladder:  The small amount of sludge observed dependently in the  gallbladder. No definite gallstone. No gallbladder wall thickening. Sonographic Murphy's sign absent.  Common bile duct:  Diameter: 4 mm, within normal limits.  Liver:  No focal lesion identified. Within normal limits in parenchymal echogenicity. Reduced sensitivity due to difficulty with sonic windows.  On focused review of recent CT scans, it is my impression that the patient has a healing lower sternal fracture.  IMPRESSION: 1. Small amount of sludge in the gallbladder, without gallbladder wall thickening, pericholecystic fluid, or sonographic Murphy's sign. 2. Serial review of recent CT scans makes me suspect that the patient probably has a healing lower sternal fracture, which could possibly be contributing to pain in this vicinity. 3. Characterization of the liver is slightly limited due to poor sonic windows, but on yesterday's CT scan no specific hepatic abnormality was observed.   Electronically Signed   By: Van Clines M.D.   On: 01/09/2015 17:21         Subjective: Patient is pleasant but confused. She denies any headache, chest pain, short of breath, abdominal pain. There is no vomiting or diarrhea.   Objective: Filed Vitals:   01/09/15 2000 01/09/15 2050 01/09/15 2054 01/10/15 0515  BP: 134/59  131/61 152/60  Pulse: 93  90 92  Temp:   98.2 F (36.8 C) 99.3 F (37.4 C)  TempSrc:   Oral Oral  Resp:   18 18  Height:   '5\' 4"'$  (1.626 m)   Weight:  94.62 kg (208 lb 9.6 oz)    SpO2: 95%  92% 100%    Intake/Output Summary (Last 24 hours) at 01/10/15 1058 Last data filed at 01/10/15 1010  Gross per 24  hour  Intake    240 ml  Output    600 ml  Net   -360 ml   Weight change:  Exam:   General:  Pt is alert, follows commands appropriately, not in acute distress  HEENT: No icterus, No thrush, No meningismus, [place]/AT  Cardiovascular: RRR, S1/S2, no rubs, no gallops  Respiratory: Bibasilar crackles without any wheezing. Good air movement   Abdomen: Soft/+BS, non tender,  non distended, no guarding  Extremities: 2+LE edema, No lymphangitis, No petechiae, No rashes, no synovitis  Data Reviewed: Basic Metabolic Panel:  Recent Labs Lab 01/08/15 1601 01/09/15 1524 01/10/15 0519  NA 140 139 139  K 4.1 3.2* 3.7  CL 105 101 103  CO2 25 28 26   GLUCOSE 115* 127* 126*  BUN 13 9 9   CREATININE 0.64 0.87 0.90  CALCIUM 9.1 8.8* 8.6*   Liver Function Tests:  Recent Labs Lab 01/08/15 1601 01/09/15 1524 01/10/15 0519  AST 22 18 14*  ALT 8* 11* 9*  ALKPHOS 228* 264* 211*  BILITOT 1.1 0.6 0.8  PROT 6.8 6.8 6.0*  ALBUMIN 3.5 3.3* 2.9*    Recent Labs Lab 01/08/15 1601 01/09/15 1524  LIPASE 166* 14*   No results for input(s): AMMONIA in the last 168 hours. CBC:  Recent Labs Lab 01/08/15 1601 01/09/15 1524  WBC 10.7* 8.6  NEUTROABS  --  5.3  HGB 13.4 13.6  HCT 42.7 43.1  MCV 87.9 86.7  PLT 357 377   Cardiac Enzymes:  Recent Labs Lab 01/10/15 0519  TROPONINI <0.03   BNP: Invalid input(s): POCBNP CBG: No results for input(s): GLUCAP in the last 168 hours.  No results found for this or any previous visit (from the past 240 hour(s)).   Scheduled Meds: . atorvastatin  20 mg Oral q1800  . feeding supplement (ENSURE ENLIVE)  237 mL Oral BID BM  . furosemide  20 mg Oral Daily  . montelukast  10 mg Oral QHS  . pantoprazole (PROTONIX) IV  40 mg Intravenous Q24H  . polyethylene glycol  17 g Oral Daily  . warfarin  3.5 mg Oral q1800  . Warfarin - Physician Dosing Inpatient   Does not apply q1800   Continuous Infusions: . sodium chloride 100 mL/hr at 01/10/15 0531     Karen Velarde, DO  [facility] Pager [phone]  If 7PM-7AM, please contact night-coverage [email or web address] Password [facility] 01/10/2015, 10:58 AM

## 2015-01-10 NOTE — Progress Notes (Signed)
Initial Nutrition Assessment  DOCUMENTATION CODES:  Obesity unspecified  INTERVENTION:  MVI daily Resource Breeze po TID, each supplement provides 250 kcal and 9 grams of protein  NUTRITION DIAGNOSIS:  Increased nutrient needs related to wound healing as evidenced by estimated needs.  GOAL:  Patient will meet greater than or equal to 90% of their needs  MONITOR:  PO intake, Supplement acceptance, Labs, Weight trends, Skin, I & O's  REASON FOR ASSESSMENT:  Malnutrition Screening Tool    ASSESSMENT: Karen Waters is a 64 y.o. female with history of DVT who was admitted for abdominal pain. Pain started Friday evening : epigastric, constant, increased by cough, with no chest pain or dyspnea or cough. She denied nausea, vomiting, diarrhea or constipation. She had no appetite. She was seen yesterday at [facility] and was sent home from ER as she was stable and able to tolerate PO but she said she continues to have pain and has no appetite because of it.   Pt admitted with acute encephalopathy.   Pt was in room with her head under bedsheets at time of visit. Unable to engage pt in interview at time of visit. Unable to complete Nutrition-Focused physical exam at this time.   Wt hx reveals some inconsistencies with weight; it appears UBW is around 210-215#.   Pt with poor PO intake due to nausea and vomiting. Noted 0% meal intake. There was an unopened bottle of Ensure at bedside as well as Ensure in a sytrofoam cup (cup was about 75% of the way full). Will add Resource Breeze supplement, as pt may be able to tolerate bettter due to nausea.   Pt is at nutritional risk due to poor PO intake and increased nutritional needs due to wound healing.   Height:  Ht Readings from Last 1 Encounters:  01/09/15 5\' 4"  (1.626 m)    Weight:  Wt Readings from Last 1 Encounters:  01/09/15 208 lb 9.6 oz (94.62 kg)    Ideal Body Weight:  54.5 kg  Wt Readings from Last 10 Encounters:  01/09/15  208 lb 9.6 oz (94.62 kg)  06/21/14 235 lb (106.595 kg)  06/21/14 235 lb 11.2 oz (106.913 kg)  06/14/14 214 lb (97.07 kg)  06/15/14 214 lb (97.07 kg)  03/22/14 210 lb (95.255 kg)  02/22/14 216 lb (97.977 kg)    BMI:  Body mass index is 35.79 kg/(m^2).  Estimated Nutritional Needs:  Kcal:  1700-1900  Protein:  85-100 grams  Fluid:  1.71.9 L  Skin:  Wound (see comment) (st II PU on rt lower buttocks)  Diet Order:  Diet Heart Room service appropriate?: Yes; Fluid consistency:: Thin  EDUCATION NEEDS:  No education needs identified at this time   Intake/Output Summary (Last 24 hours) at 01/10/15 1516 Last data filed at 01/10/15 1010  Gross per 24 hour  Intake    240 ml  Output    600 ml  Net   -360 ml    Last BM:  01/05/15  Townsend Cudworth A. Jimmye Norman, RD, LDN, CDE Pager: [phone] After hours Pager: [phone]

## 2015-01-11 LAB — CBC
HEMATOCRIT: 33.7 % — AB (ref 36.0–46.0)
Hemoglobin: 10.8 g/dL — ABNORMAL LOW (ref 12.0–15.0)
MCH: 27 pg (ref 26.0–34.0)
MCHC: 31.5 g/dL (ref 30.0–36.0)
MCV: 86 fL (ref 78.0–100.0)
Platelets: 292 10*3/uL (ref 150–400)
RBC: 3.92 MIL/uL (ref 3.87–5.11)
RDW: 15.8 % — ABNORMAL HIGH (ref 11.5–15.5)
WBC: 6.3 10*3/uL (ref 4.0–10.5)

## 2015-01-11 LAB — FOLATE RBC
FOLATE, RBC: 1327 ng/mL (ref >498)
Folate, Hemolysate: 447.3 ng/mL
HEMATOCRIT: 33.7 % — AB (ref 34.0–46.6)

## 2015-01-11 LAB — BASIC METABOLIC PANEL
Anion gap: 7 (ref 5–15)
BUN: 5 mg/dL — AB (ref 6–20)
CALCIUM: 8 mg/dL — AB (ref 8.9–10.3)
CO2: 28 mmol/L (ref 22–32)
Chloride: 105 mmol/L (ref 101–111)
Creatinine, Ser: 0.77 mg/dL (ref 0.44–1.00)
GFR calc Af Amer: >60 mL/min (ref >60)
GFR calc non Af Amer: >60 mL/min (ref >60)
Glucose, Bld: 91 mg/dL (ref 65–99)
POTASSIUM: 3.3 mmol/L — AB (ref 3.5–5.1)
Sodium: 140 mmol/L (ref 135–145)

## 2015-01-11 LAB — GLUCOSE, CAPILLARY
GLUCOSE-CAPILLARY: 185 mg/dL — AB (ref 65–99)
GLUCOSE-CAPILLARY: 136 mg/dL — AB (ref 65–99)
Glucose-Capillary: 110 mg/dL — ABNORMAL HIGH (ref 65–99)
Glucose-Capillary: 152 mg/dL — ABNORMAL HIGH (ref 65–99)

## 2015-01-11 LAB — PROTIME-INR
INR: 1.92 — ABNORMAL HIGH (ref 0.00–1.49)
Prothrombin Time: 21.9 seconds — ABNORMAL HIGH (ref 11.6–15.2)

## 2015-01-11 MED ORDER — POTASSIUM CHLORIDE CRYS ER 20 MEQ PO TBCR
40.0000 meq | EXTENDED_RELEASE_TABLET | Freq: Once | ORAL | Status: AC
  Administered 2015-01-11: 40 meq via ORAL
  Filled 2015-01-11: qty 2

## 2015-01-11 NOTE — Discharge Summary (Signed)
Physician Discharge Summary  Karen Waters HBZ:[id number] DOB: 08-31-1950 DOA: 01/09/2015  PCP: Alvester Chou, NP  Admit date: 01/09/2015 Discharge date: 01/11/2015  Recommendations for Outpatient Follow-up:  1. Pt will need to follow up with PCP in 2 weeks post discharge 2. Please obtain BMP and CBC in one week  Discharge Diagnoses:  Acute encephalopathy -volume depletion?  Improving with just IVF 01/11/15--pt much more alert, able to provide good medical historical events such as needing kyphoplasty in the future -CT brain without contrast--neg -pt refuses MRI brain -Serum B12--555 -TSH--1.339 -Ammonia--32 -RBC folate--1327 -ESR--45 -Apparently, there was concern for possible temporal arteritis back in December 2015, but the patient never followed up for her temporal artery biopsy. Presently she denies any headache or eye pain or jaw claudication. The patient was placed on prednisone empirically, but has been taken off for at least 3 months now Diabetes mellitus type 2 -12/29/2014 hemoglobin A1c 9.2 -initially on insulin when placed on prednisone -According to the patient's daughter, the patient was taken off her prednisone a few months ago because of concerns for osteoporosis and her compression fractures -She has not taken any insulin for over one month -NovoLog sliding-scale while monitoring her CBGs--fairly well controlled--130-180 -will need f/u with PCP regarding need for oral meds -pt does not want to restart insulin as it previously caused hypoglycemia Gait instability/frequent falls - Patient presented with mechanical fall in March 2016 resulting in a distal radius fracture on the right  -PT evaluation --no PT f/u  Abdominal pain  -Unclear if the patient was truly having any pain or just confused -She frequently to contradict himself with regard to complaints of pain  -01/08/2015 CT abdomen and pelvis negative for any acute intra-abdominal abnormalities  -lipase  14 History of lower extremity DVT  -Continue warfarin for now  -Patient has IVC filter  Coronary artery disease  -3 vessel calcification noted on CT  -Continue statin  Chronic lymphedema  - 2/2 to lymph node exicision for her uterine cancer per patient. was taking lasix for it. Will not give her that since she is not volume overloaded on exam, just her lymphadedema.  Discharge Condition: stable  Disposition: home  Diet:carb modified Wt Readings from Last 3 Encounters:  01/09/15 94.62 kg (208 lb 9.6 oz)  06/21/14 106.595 kg (235 lb)  06/21/14 106.913 kg (235 lb 11.2 oz)    History of present illness:  64 year old female with a history of hypertension, lower extremity DVT, COPD, continued tobacco use, diabetes mellitus, and lymphedema of her lower extremities presented with epigastric and left flank pain. Workup essentially has been unremarkable. Initially, the patient was thought to have pancreatitis with a lipase of 166. She initially presented to [facility] hospital on 01/08/2015. CT of the abdomen and pelvis was negative for any pancreatic inflammation.  There were no acute abnormalities. The patient was discharged from the emergency department. She represented to [facility] emergency department with left flank pain. Urinalysis was negative for hematuria or pyuria. The patient was admitted for abdominal pain. However, upon speaking with the patient's daughter, it appears that the patient has had increasing confusion over the last 3-4 days. Her daughter states that the patient has had a cognitive and functional decline over the past 15-16 months. At baseline, the patient is essentially bedbound with assistance with transfers to a bedside commode. History has been difficult to obtain from the patient as she is confused. The patient contradicts herself when complaining of pain. But on 7/12, she stated pain completely improved  Discharge Exam: Filed Vitals:   01/11/15 2102  BP: 158/65  Pulse: 79   Temp: 98.4 F (36.9 C)  Resp: 20   Filed Vitals:   01/10/15 2124 01/11/15 0628 01/11/15 1320 01/11/15 2102  BP: 109/91 149/82 129/54 158/65  Pulse: 95 87 90 79  Temp: 99.7 F (37.6 C) 98.5 F (36.9 C) 98.9 F (37.2 C) 98.4 F (36.9 C)  TempSrc: Oral Oral Oral Oral  Resp: _0 Height:      Weight:      SpO2: 95% 95% 97% 94%   General: A&O x 3, NAD, pleasant, cooperative Cardiovascular: RRR, no rub, no gallop, no S3 Respiratory: bibasilarrales, no wheeze Abdomen:soft, nontender, nondistended, positive bowel sounds Extremities: 2+ edema, No lymphangitis, no petechiae  Discharge Instructions     Medication List    ASK your doctor about these medications        atorvastatin 20 MG tablet  Commonly known as:  LIPITOR  Take 20 mg by mouth daily at 6 PM.     DEXILANT 60 MG capsule  Generic drug:  dexlansoprazole  Take 60 mg by mouth daily.     fluticasone 50 MCG/ACT nasal spray  Commonly known as:  FLONASE  Place 2 sprays into both nostrils daily as needed for allergies (allergies).     FORTEO 600 MCG/2.4ML Soln  Generic drug:  Teriparatide (Recombinant)  Inject 20 mcg into the skin every evening.     furosemide 40 MG tablet  Commonly known as:  LASIX  Take 20 mg by mouth daily.     HYDROcodone-acetaminophen 7.5-325 MG per tablet  Commonly known as:  NORCO  Take 1 tablet by mouth every 6 (six) hours as needed for moderate pain.     montelukast 10 MG tablet  Commonly known as:  SINGULAIR  Take 10 mg by mouth at bedtime.     mupirocin ointment 2 %  Commonly known as:  BACTROBAN  Apply 1 application topically daily.     polyethylene glycol packet  Commonly known as:  MIRALAX / GLYCOLAX  Take 17 g by mouth daily.     Vitamin D (Ergocalciferol) 50000 UNITS Caps capsule  Commonly known as:  DRISDOL  Take 1 capsule (50,000 Units total) by mouth every 7 (seven) days.     warfarin 1 MG tablet  Commonly known as:  COUMADIN  Take 3.5 mg by mouth daily.          The results of significant diagnostics from this hospitalization (including imaging, microbiology, ancillary and laboratory) are listed below for reference.    Significant Diagnostic Studies: Ct Head Wo Contrast  01/10/2015   CLINICAL DATA:  Altered mental status  EXAM: CT HEAD WITHOUT CONTRAST  TECHNIQUE: Contiguous axial images were obtained from the base of the skull through the vertex without intravenous contrast.  COMPARISON:  September 10, 2014  FINDINGS: Mild diffuse atrophy is stable. There is no intracranial mass, hemorrhage, extra-axial fluid collection, or midline shift. There is patchy small vessel disease throughout the centra semiovale bilaterally. Elsewhere gray-white compartments are normal. No acute infarct is apparent. The bony calvarium appears intact. There is mild chronic thickening of several posterior inferior mastoid air cells bilaterally. Remainder the mastoid air cells bilaterally are clear in stable.  IMPRESSION: Stable atrophy with periventricular small vessel disease. Mild chronic mastoid thickening inferiorly on both sides. No intracranial mass, hemorrhage, or acute appearing infarct. No new mastoid disease.   Electronically Signed   By: Lowella Grip  III M.D.   On: 01/10/2015 15:20   Ct Abdomen Pelvis W Contrast  01/08/2015   CLINICAL DATA:  Generalized abdominal pain and constipation.  EXAM: CT ABDOMEN AND PELVIS WITH CONTRAST  TECHNIQUE: Multidetector CT imaging of the abdomen and pelvis was performed using the standard protocol following bolus administration of intravenous contrast.  CONTRAST:  160m OMNIPAQUE IOHEXOL 300 MG/ML SOLN, 517mOMNIPAQUE IOHEXOL 300 MG/ML SOLN  COMPARISON:  12/04/2014  FINDINGS: Lower chest: The lung bases are clear of acute process. Minimal scarring and/or atelectasis. The heart is normal in size. No pericardial effusion. Coronary artery calcifications are noted. The distal esophagus is grossly normal.  Hepatobiliary: No focal hepatic  lesions or intrahepatic biliary dilatation. The gallbladder is normal. No common bile duct dilatation.  Pancreas: Moderate atrophy but no mass or inflammation.  Spleen: Normal size.  No focal lesions.  Adrenals/Urinary Tract: Stable bilateral adrenal gland adenomas.  Bilateral renal calculi but no obstructing ureteral calculi or bladder calculi. Both kidneys demonstrate normal enhancement/ perfusion. No worrisome renal lesions. The delayed images do not demonstrate any significant collecting system abnormality.  Stomach/Bowel: The stomach, duodenum, small bowel and colon are unremarkable. No inflammatory changes, mass lesions or obstructive findings.  Vascular/Lymphatic: Advanced atherosclerotic calcifications involving the aorta and branch vessels but no focal aneurysm. The major venous structures are patent. Stable IVC filter.  Other: The uterus is surgically absent. There is a mild cystocele noted. No pelvic mass or adenopathy. No free pelvic fluid collections. No inguinal mass or adenopathy.  Musculoskeletal: Numerous thoracic and lumbar compression fractures. T8, T11 and L1 are chronic. The since the prior study there is a new compression fracture of L3 and further compression of L4.  IMPRESSION: 1. No acute abdominal/pelvic findings, mass lesions or adenopathy. 2. New compression fracture of L3 a since the prior CT scan and further compression of L4. Remote fractures of T8, T11 and L1. 3. Bilateral renal calculi but no obstructing ureteral calculi or bladder calculi. 4. Stable bilateral adrenal gland adenomas. 5. Stable advanced atherosclerotic calcifications involving the aorta and branch vessels. 6. Mild cystocele. 7. Stable IVC filter.   Electronically Signed   By: P.Marijo Sanes.D.   On: 01/08/2015 18:16   UsKoreabdomen Limited  01/09/2015   CLINICAL DATA:  Right upper quadrant abdominal pain. Hypertension. Diabetes.  EXAM: US[place]oreaBDOMEN LIMITED - RIGHT UPPER QUADRANT  COMPARISON:  CT scan of 01/08/2015   FINDINGS: Gallbladder:  The small amount of sludge observed dependently in the gallbladder. No definite gallstone. No gallbladder wall thickening. Sonographic Murphy's sign absent.  Common bile duct:  Diameter: 4 mm, within normal limits.  Liver:  No focal lesion identified. Within normal limits in parenchymal echogenicity. Reduced sensitivity due to difficulty with sonic windows.  On focused review of recent CT scans, it is my impression that the patient has a healing lower sternal fracture.  IMPRESSION: 1. Small amount of sludge in the gallbladder, without gallbladder wall thickening, pericholecystic fluid, or sonographic Murphy's sign. 2. Serial review of recent CT scans makes me suspect that the patient probably has a healing lower sternal fracture, which could possibly be contributing to pain in this vicinity. 3. Characterization of the liver is slightly limited due to poor sonic windows, but on yesterday's CT scan no specific hepatic abnormality was observed.   Electronically Signed   By: WaVan Clines.D.   On: 01/09/2015 17:21     Microbiology: No results found for this or any previous visit (from the past  240 hour(s)).   Labs: Basic Metabolic Panel:  Recent Labs Lab 01/08/15 1601 01/09/15 1524 01/10/15 0519 01/11/15 0550  NA 140 139 139 140  K 4.1 3.2* 3.7 3.3*  CL 105 101 103 105  CO2 _0 GLUCOSE 115* 127* 126* 91  BUN _1 5*  CREATININE 0.64 0.87 0.90 0.77  CALCIUM 9.1 8.8* 8.6* 8.0*   Liver Function Tests:  Recent Labs Lab 01/08/15 1601 01/09/15 1524 01/10/15 0519  AST 22 18 14*  ALT 8* 11* 9*  ALKPHOS 228* 264* 211*  BILITOT 1.1 0.6 0.8  PROT 6.8 6.8 6.0*  ALBUMIN 3.5 3.3* 2.9*    Recent Labs Lab 01/08/15 1601 01/09/15 1524  LIPASE 166* 14*    Recent Labs Lab 01/10/15 1156  AMMONIA 32   CBC:  Recent Labs Lab 01/08/15 1601 01/09/15 1524 01/10/15 1613 01/11/15 0550  WBC 10.7* 8.6  --  6.3  NEUTROABS  --  5.3  --   --   HGB  13.4 13.6  --  10.8*  HCT 42.7 43.1 33.7* 33.7*  MCV 87.9 86.7  --  86.0  PLT 357 377  --  292   Cardiac Enzymes:  Recent Labs Lab 01/10/15 0519  TROPONINI <0.03   BNP: Invalid input(s): POCBNP CBG:  Recent Labs Lab 01/10/15 2332 01/11/15 0757 01/11/15 1203 01/11/15 1729 01/11/15 2056  GLUCAP 208* 110* 185* 136* 152*    Time coordinating discharge:  Greater than 30 minutes  Signed:  Keandria Berrocal, DO [facility] Pager: [phone] 01/11/2015, 10:09 PM

## 2015-01-11 NOTE — Clinical Documentation Improvement (Signed)
   01/09/15:  Hgb= 13.6  01/11/15:  Hgb= 10.8  Please clarify the specific medical condition related to the clinical findings if possible.  Thank you!  Pryor Montes, BSN, RN [facility]/Clinical Documentation Specialist [email or web address] [phone]

## 2015-01-11 NOTE — Progress Notes (Signed)
Patient transported to radiology at 2133 for MRI of the brain. Radiology dept called writer stating MRI could not be performed due to patient unable to lie flat on her back without being in so much pain. MRI not done. Patient returned to unit at approximately 2212 via bed, accompanied by NT. Patient in stable condition. In bed with eyes close. Call bell and personal items within reach. Thayer Ohm, RN

## 2015-01-11 NOTE — Evaluation (Signed)
Physical Therapy Evaluation Patient Details Name: Karen Waters MRN: [id number] DOB: 08/16/1950 Today's Date: 01/11/2015   History of Present Illness  Meshell Abdulaziz is a 64 y.o. female with history of DVT who was admitted for abdominal pain  Clinical Impression  Pt admitted with above diagnosis and presents to PT with functional limitations due to deficits listed below (See PT problem list). Pt needs skilled PT to maximize independence and safety to allow discharge to home with family. Spoke with daughter [name] and she confirms that pt is close to her baseline for mobility and that they can continue to assist pt as needed. They have had HHPT in the past and feel it hasn't been beneficial and do not wish to have it at dc.     Follow Up Recommendations No PT follow up;Supervision for mobility/OOB    Equipment Recommendations  None recommended by PT    Recommendations for Other Services       Precautions / Restrictions Precautions Precautions: Fall      Mobility  Bed Mobility Overal bed mobility: Needs Assistance Bed Mobility: Supine to Sit;Sit to Supine     Supine to sit: Supervision;HOB elevated Sit to supine: Min assist   General bed mobility comments: Assist to bring legs back up into bed.  Transfers Overall transfer level: Needs assistance Equipment used: Rolling walker (2 wheeled) Transfers: Sit to/from [facility] Sit to Stand: Min assist Stand pivot transfers: Min assist       General transfer comment: Assist for balance and support.  Ambulation/Gait Ambulation/Gait assistance: Min assist Ambulation Distance (Feet): 15 Feet Assistive device: Rolling walker (2 wheeled) Gait Pattern/deviations: Step-through pattern;Decreased step length - right;Decreased step length - left;Wide base of support Gait velocity: slow Gait velocity interpretation: Below normal speed for age/gender General Gait Details: Pt fatigues quickly  Stairs            Wheelchair Mobility    Modified Rankin (Stroke Patients Only)       Balance Overall balance assessment: Needs assistance Sitting-balance support: No upper extremity supported;Feet supported Sitting balance-Leahy Scale: Good     Standing balance support: Single extremity supported Standing balance-Leahy Scale: Poor Standing balance comment: Needs UE support                              Pertinent Vitals/Pain      Home Living Family/patient expects to be discharged to:: Private residence Living Arrangements: Children Available Help at Discharge: Family;Available 24 hours/day Type of Home: House Home Access: Stairs to enter Entrance Stairs-Rails: None Entrance Stairs-Number of Steps: 2 Home Layout: One level Home Equipment: Walker - 2 wheels;Wheelchair - manual;Grab bars - toilet;Tub bench Additional Comments: Pt lives with daughter and son-in-law. Has an aide M-F 8-2pm.     Prior Function Level of Independence: Needs assistance   Gait / Transfers Assistance Needed: amb short distances with supervision/min assist           Hand Dominance        Extremity/Trunk Assessment   Upper Extremity Assessment: Overall WFL for tasks assessed           Lower Extremity Assessment: Generalized weakness         Communication   Communication: No difficulties  Cognition Arousal/Alertness: Awake/alert Behavior During Therapy: WFL for tasks assessed/performed Overall Cognitive Status: Within Functional Limits for tasks assessed  General Comments      Exercises        Assessment/Plan    PT Assessment Patient needs continued PT services  PT Diagnosis Difficulty walking;Generalized weakness   PT Problem List    PT Treatment Interventions DME instruction;Gait training;Functional mobility training;Therapeutic activities;Therapeutic exercise;Balance training;Patient/family education   PT Goals (Current goals can be found  in the Care Plan section) Acute Rehab PT Goals Patient Stated Goal: return home PT Goal Formulation: With patient Time For Goal Achievement: 01/18/15 Potential to Achieve Goals: Good    Frequency Min 3X/week   Barriers to discharge        Co-evaluation               End of Session Equipment Utilized During Treatment: Gait belt Activity Tolerance: Patient limited by fatigue Patient left: in bed;with call bell/phone within reach;with bed alarm set Nurse Communication: Mobility status         Time: 1771-1657 PT Time Calculation (min) (ACUTE ONLY): 20 min   Charges:   PT Evaluation $Initial PT Evaluation Tier I: 1 Procedure     PT G Codes:        Pio Eatherly 20-Jan-2015, 2:36 PM  [facility] PT [phone]

## 2015-01-12 LAB — BASIC METABOLIC PANEL
ANION GAP: 8 (ref 5–15)
BUN: 6 mg/dL (ref 6–20)
CHLORIDE: 107 mmol/L (ref 101–111)
CO2: 25 mmol/L (ref 22–32)
CREATININE: 0.75 mg/dL (ref 0.44–1.00)
Calcium: 8.4 mg/dL — ABNORMAL LOW (ref 8.9–10.3)
GFR calc Af Amer: >60 mL/min (ref >60)
GFR calc non Af Amer: >60 mL/min (ref >60)
Glucose, Bld: 126 mg/dL — ABNORMAL HIGH (ref 65–99)
Potassium: 3.1 mmol/L — ABNORMAL LOW (ref 3.5–5.1)
SODIUM: 140 mmol/L (ref 135–145)

## 2015-01-12 LAB — PROTIME-INR
INR: 1.84 — ABNORMAL HIGH (ref 0.00–1.49)
Prothrombin Time: 21.2 seconds — ABNORMAL HIGH (ref 11.6–15.2)

## 2015-01-12 LAB — GLUCOSE, CAPILLARY
Glucose-Capillary: 125 mg/dL — ABNORMAL HIGH (ref 65–99)
Glucose-Capillary: 121 mg/dL — ABNORMAL HIGH (ref 65–99)

## 2015-01-12 LAB — CLOSTRIDIUM DIFFICILE BY PCR: Toxigenic C. Difficile by PCR: NEGATIVE

## 2015-01-12 MED ORDER — POTASSIUM CHLORIDE CRYS ER 20 MEQ PO TBCR
40.0000 meq | EXTENDED_RELEASE_TABLET | ORAL | Status: AC
  Administered 2015-01-12 (×2): 40 meq via ORAL
  Filled 2015-01-12 (×2): qty 2

## 2015-01-12 MED ORDER — HYOSCYAMINE SULFATE 0.125 MG SL SUBL
0.2500 mg | SUBLINGUAL_TABLET | Freq: Once | SUBLINGUAL | Status: AC
  Administered 2015-01-12: 0.25 mg via SUBLINGUAL
  Filled 2015-01-12: qty 2

## 2015-01-12 MED ORDER — MAGNESIUM SULFATE IN D5W 10-5 MG/ML-% IV SOLN
1.0000 g | Freq: Once | INTRAVENOUS | Status: AC
  Administered 2015-01-12: 1 g via INTRAVENOUS
  Filled 2015-01-12: qty 100

## 2015-01-12 NOTE — Clinical Social Work Note (Signed)
Per MD patient ready for DC home. RN and patient/family notified of DC home. Address confirmed by patient/family. DC packet on chart. Ambulance transport requested for 2:00PM. CSW signing off at this time.    Liz Beach MSW, [place], [place], [phone]

## 2015-01-12 NOTE — Plan of Care (Addendum)
[facility]                                                                                                                                                                         Patient Demographics  Karen Waters, is a 64 y.o. female  QIW:[id number]  ERD:[id number]  DOB - [birth year]  Admit date - 01/09/2015  Admitting Physician Gennaro Africa, MD  Outpatient Primary MD for the patient is Alvester Chou, NP  LOS - 2   Chief Complaint  Patient presents with  . Abdominal Pain        Assessment & Plan    Patient seen briefly today due for discharge soon per Discharge done yesterday by Dr Tat, no further issues, Vital signs stable, patient feels fine.    Dehydration related  encephalopathy. Stop Lasix. Resolved with IV fluids. No focal deficits back to baseline.  Hypokalemia - replaced  ? Mild Dementia - out pt Neuro follow up, cleared by PT, AAOx 4 today, daughter updated  Dilution related anemia secondary to IV fluids. Stable outpatient follow-up with PCP   Follow-up Information    Follow up with Alvester Chou, NP In 1 week.   Specialty:  Nurse Practitioner   Contact information:   [facility] [place] [address] [phone]       Follow up with [facility]. Schedule an appointment as soon as possible for a visit in 1 week.   Why:  dementia   Contact information:   [address] [place] [phone]       Medication List    STOP taking these medications        furosemide 40 MG tablet  Commonly known as:  LASIX      TAKE these medications        atorvastatin 20 MG tablet  Commonly known as:  LIPITOR  Take 20 mg by mouth daily at 6 PM.     DEXILANT 60 MG capsule  Generic drug:  dexlansoprazole  Take 60 mg by mouth daily.     fluticasone 50 MCG/ACT nasal spray  Commonly known as:  FLONASE    Place 2 sprays into both nostrils daily as needed for allergies (allergies).     FORTEO 600 MCG/2.4ML Soln  Generic drug:  Teriparatide (Recombinant)  Inject 20 mcg into the skin every evening.     HYDROcodone-acetaminophen 7.5-325 MG per tablet  Commonly known as:  NORCO  Take 1 tablet by mouth every 6 (six) hours as needed for moderate pain.     montelukast 10 MG tablet  Commonly known as:  SINGULAIR  Take 10 mg by mouth at bedtime.  mupirocin ointment 2 %  Commonly known as:  BACTROBAN  Apply 1 application topically daily.     polyethylene glycol packet  Commonly known as:  MIRALAX / GLYCOLAX  Take 17 g by mouth daily.     Vitamin D (Ergocalciferol) 50000 UNITS Caps capsule  Commonly known as:  DRISDOL  Take 1 capsule (50,000 Units total) by mouth every 7 (seven) days.     warfarin 1 MG tablet  Commonly known as:  COUMADIN  Take 3.5 mg by mouth daily.       Medications  Scheduled Meds: . atorvastatin  20 mg Oral q1800  . feeding supplement (RESOURCE BREEZE)  1 Container Oral TID BM  . furosemide  20 mg Oral Daily  . insulin aspart  0-5 Units Subcutaneous QHS  . insulin aspart  0-9 Units Subcutaneous TID WC  . magnesium sulfate 1 - 4 g bolus IVPB  1 g Intravenous Once  . montelukast  10 mg Oral QHS  . multivitamin with minerals  1 tablet Oral Daily  . pantoprazole (PROTONIX) IV  40 mg Intravenous Q24H  . polyethylene glycol  17 g Oral Daily  . potassium chloride  40 mEq Oral Q4H  . warfarin  3.5 mg Oral q1800  . Warfarin - Physician Dosing Inpatient   Does not apply q1800   Continuous Infusions: . sodium chloride 100 mL/hr at 01/12/15 0622   PRN Meds:.acetaminophen, haloperidol lactate, HYDROcodone-acetaminophen, ondansetron (ZOFRAN) IV    Time Spent in minutes   10 minutes   Thurnell Lose M.D on 01/12/2015 at 9:27 AM  Between 7am to 7pm - Pager - [phone]  After 7pm go to [email or web address] - password [email or web address]  And look for the night coverage  person covering for me after hours  [facility] Office  [phone]    Subjective:   Karen Waters today has, No headache, No chest pain, No abdominal pain - No Nausea, No new weakness tingling or numbness, No Cough - SOB.    Objective:   Filed Vitals:   01/11/15 0628 01/11/15 1320 01/11/15 2102 01/12/15 0732  BP: 149/82 129/54 158/65 146/65  Pulse: 87 90 79 96  Temp: 98.5 F (36.9 C) 98.9 F (37.2 C) 98.4 F (36.9 C) 98.5 F (36.9 C)  TempSrc: Oral Oral Oral Oral  Resp: 18 20 20 24   Height:      Weight:      SpO2: 95% 97% 94% 95%    Wt Readings from Last 3 Encounters:  01/09/15 94.62 kg (208 lb 9.6 oz)  06/21/14 106.595 kg (235 lb)  06/21/14 106.913 kg (235 lb 11.2 oz)     Intake/Output Summary (Last 24 hours) at 01/12/15 0927 Last data filed at 01/12/15 0853  Gross per 24 hour  Intake    762 ml  Output      0 ml  Net    762 ml    Exam Awake Alert, Oriented X 3, No new F.N deficits, Normal affect [place].AT,PERRAL Supple Neck,No JVD, No cervical lymphadenopathy appriciated.  Symmetrical Chest wall movement, Good air movement bilaterally, CTAB RRR,No Gallops,Rubs or new Murmurs, No Parasternal Heave +ve B.Sounds, Abd Soft, Non tender, No organomegaly appriciated, No rebound - guarding or rigidity. No Cyanosis, Clubbing or edema, No new Rash or bruise     Data Review

## 2015-01-12 NOTE — Care Management Important Message (Signed)
Important Message  Patient Details  Name: Karen Waters MRN: [id number] Date of Birth: Aug 15, 1950   Medicare Important Message Given:  Yes-second notification given    Nathen May 01/12/2015, 2:42 [place] Message  Patient Details  Name: Karen Waters MRN: [id number] Date of Birth: 10/01/1950   Medicare Important Message Given:  Yes-second notification given    Nathen May 01/12/2015, 2:42 PM

## 2015-01-12 NOTE — Progress Notes (Signed)
D/c instructions reviewed with pt. Pt given copy of instructions, pt will go home via transport by [facility] arranged by SW. Belongings packed and will go with pt. Waiting transportation.

## 2015-01-12 NOTE — Care Management Note (Addendum)
Case Management Note  Patient Details  Name: Karen Waters MRN: [id number] Date of Birth: 02/10/1951  Subjective/Objective:     Patient is for discharge today, daughter , [name] states patient has a Marine scientist with [employer],  NCM asked if she would like hh services to be set up for her, she states no, also in pt note daughter feels like hhpt has not been beneficial in the past.  NCM called [facility] to inform them that patient is being dc today, rep said patient has pcs services , where the aide is there Mon-Fri from 8am-2pm and Sats from 8 am- 12.  Rep states she will contact the patients caregiver and the aide.    [name], patient's daughter has decided to get [facility] and [facility] Social worker.  She chose [facility], referral made to [facility], soc will begin 24-48 hrs post dc.  Patient will also need ambulance for transport, CSW aware, address confirmed with [name].            Action/Plan:   Expected Discharge Date:                  Expected Discharge Plan:  [place]  In-House Referral:     Discharge planning Services  CM Consult  Post Acute Care Choice:  Home Health, Resumption of Svcs/PTA Provider Choice offered to:  Adult Children  DME Arranged:    DME Agency:     HH Arranged:   HHRN, Social Worker [name] Agency:   [facility]  Status of Service:  Completed, signed off  Medicare Important Message Given:    Date Medicare IM Given:    Medicare IM give by:    Date Additional Medicare IM Given:    Additional Medicare Important Message give by:     If discussed at [place] of Stay Meetings, dates discussed:    Additional Comments:  Zenon Mayo, RN 01/12/2015, 11:47 AM

## 2015-01-19 ENCOUNTER — Other Ambulatory Visit (HOSPITAL_COMMUNITY): Payer: Self-pay | Admitting: Interventional Radiology

## 2015-01-19 DIAGNOSIS — IMO0002 Reserved for concepts with insufficient information to code with codable children: Secondary | ICD-10-CM

## 2015-01-19 DIAGNOSIS — M549 Dorsalgia, unspecified: Secondary | ICD-10-CM

## 2015-01-25 ENCOUNTER — Ambulatory Visit (HOSPITAL_COMMUNITY): Admission: RE | Admit: 2015-01-25 | Payer: Medicare Other | Source: Ambulatory Visit

## 2015-01-26 ENCOUNTER — Ambulatory Visit (INDEPENDENT_AMBULATORY_CARE_PROVIDER_SITE_OTHER): Payer: Medicare Other | Admitting: Neurology

## 2015-01-26 ENCOUNTER — Emergency Department (HOSPITAL_COMMUNITY): Payer: Medicare Other

## 2015-01-26 ENCOUNTER — Encounter (HOSPITAL_COMMUNITY): Payer: Self-pay | Admitting: Family Medicine

## 2015-01-26 ENCOUNTER — Encounter: Payer: Self-pay | Admitting: Neurology

## 2015-01-26 ENCOUNTER — Telehealth: Payer: Self-pay | Admitting: *Deleted

## 2015-01-26 ENCOUNTER — Emergency Department (HOSPITAL_COMMUNITY)
Admission: EM | Admit: 2015-01-26 | Discharge: 2015-01-26 | Disposition: A | Discharge status: Home / Self Care | Payer: Medicare Other | Attending: Emergency Medicine | Admitting: Emergency Medicine

## 2015-01-26 VITALS — BP 169/89 | HR 96 | Temp 98.1°F | Ht 64.0 in

## 2015-01-26 DIAGNOSIS — Y939 Activity, unspecified: Secondary | ICD-10-CM | POA: Diagnosis not present

## 2015-01-26 DIAGNOSIS — I10 Essential (primary) hypertension: Secondary | ICD-10-CM | POA: Diagnosis not present

## 2015-01-26 DIAGNOSIS — E119 Type 2 diabetes mellitus without complications: Secondary | ICD-10-CM | POA: Insufficient documentation

## 2015-01-26 DIAGNOSIS — Z86718 Personal history of other venous thrombosis and embolism: Secondary | ICD-10-CM | POA: Insufficient documentation

## 2015-01-26 DIAGNOSIS — Z8701 Personal history of pneumonia (recurrent): Secondary | ICD-10-CM | POA: Insufficient documentation

## 2015-01-26 DIAGNOSIS — M316 Other giant cell arteritis: Secondary | ICD-10-CM

## 2015-01-26 DIAGNOSIS — Y999 Unspecified external cause status: Secondary | ICD-10-CM | POA: Diagnosis not present

## 2015-01-26 DIAGNOSIS — S79911A Unspecified injury of right hip, initial encounter: Secondary | ICD-10-CM | POA: Diagnosis not present

## 2015-01-26 DIAGNOSIS — Y929 Unspecified place or not applicable: Secondary | ICD-10-CM | POA: Diagnosis not present

## 2015-01-26 DIAGNOSIS — W19XXXA Unspecified fall, initial encounter: Secondary | ICD-10-CM

## 2015-01-26 DIAGNOSIS — Z87891 Personal history of nicotine dependence: Secondary | ICD-10-CM | POA: Insufficient documentation

## 2015-01-26 DIAGNOSIS — Z7901 Long term (current) use of anticoagulants: Secondary | ICD-10-CM | POA: Diagnosis not present

## 2015-01-26 DIAGNOSIS — Z8542 Personal history of malignant neoplasm of other parts of uterus: Secondary | ICD-10-CM | POA: Insufficient documentation

## 2015-01-26 DIAGNOSIS — J449 Chronic obstructive pulmonary disease, unspecified: Secondary | ICD-10-CM | POA: Insufficient documentation

## 2015-01-26 DIAGNOSIS — R2681 Unsteadiness on feet: Secondary | ICD-10-CM

## 2015-01-26 DIAGNOSIS — M25551 Pain in right hip: Secondary | ICD-10-CM

## 2015-01-26 DIAGNOSIS — W1839XA Other fall on same level, initial encounter: Secondary | ICD-10-CM | POA: Insufficient documentation

## 2015-01-26 DIAGNOSIS — Z79899 Other long term (current) drug therapy: Secondary | ICD-10-CM | POA: Insufficient documentation

## 2015-01-26 DIAGNOSIS — Z8719 Personal history of other diseases of the digestive system: Secondary | ICD-10-CM | POA: Diagnosis not present

## 2015-01-26 DIAGNOSIS — Z8781 Personal history of (healed) traumatic fracture: Secondary | ICD-10-CM | POA: Diagnosis not present

## 2015-01-26 DIAGNOSIS — M1990 Unspecified osteoarthritis, unspecified site: Secondary | ICD-10-CM | POA: Insufficient documentation

## 2015-01-26 DIAGNOSIS — E669 Obesity, unspecified: Secondary | ICD-10-CM | POA: Insufficient documentation

## 2015-01-26 LAB — CBC WITH DIFFERENTIAL/PLATELET
BASOS ABS: 0 10*3/uL (ref 0.0–0.1)
BASOS PCT: 0 % (ref 0–1)
EOS ABS: 0 10*3/uL (ref 0.0–0.7)
EOS PCT: 0 % (ref 0–5)
HEMATOCRIT: 38.6 % (ref 36.0–46.0)
HEMOGLOBIN: 12.4 g/dL (ref 12.0–15.0)
LYMPHS ABS: 1.3 10*3/uL (ref 0.7–4.0)
LYMPHS PCT: 12 % (ref 12–46)
MCH: 27.9 pg (ref 26.0–34.0)
MCHC: 32.1 g/dL (ref 30.0–36.0)
MCV: 86.9 fL (ref 78.0–100.0)
MONO ABS: 0.9 10*3/uL (ref 0.1–1.0)
Monocytes Relative: 9 % (ref 3–12)
NEUTROS PCT: 79 % — AB (ref 43–77)
Neutro Abs: 8.6 10*3/uL — ABNORMAL HIGH (ref 1.7–7.7)
PLATELETS: 282 10*3/uL (ref 150–400)
RBC: 4.44 MIL/uL (ref 3.87–5.11)
RDW: 15.2 % (ref 11.5–15.5)
WBC: 10.9 10*3/uL — ABNORMAL HIGH (ref 4.0–10.5)

## 2015-01-26 LAB — BASIC METABOLIC PANEL
Anion gap: 8 (ref 5–15)
BUN: 11 mg/dL (ref 6–20)
CALCIUM: 9.2 mg/dL (ref 8.9–10.3)
CHLORIDE: 104 mmol/L (ref 101–111)
CO2: 27 mmol/L (ref 22–32)
Creatinine, Ser: 0.69 mg/dL (ref 0.44–1.00)
GFR calc non Af Amer: >60 mL/min (ref >60)
Glucose, Bld: 132 mg/dL — ABNORMAL HIGH (ref 65–99)
Potassium: 4.2 mmol/L (ref 3.5–5.1)
Sodium: 139 mmol/L (ref 135–145)

## 2015-01-26 LAB — URINALYSIS, ROUTINE W REFLEX MICROSCOPIC
Bilirubin Urine: NEGATIVE
Glucose, UA: NEGATIVE mg/dL
Hgb urine dipstick: NEGATIVE
Ketones, ur: NEGATIVE mg/dL
LEUKOCYTES UA: NEGATIVE
Nitrite: NEGATIVE
Protein, ur: NEGATIVE mg/dL
Specific Gravity, Urine: 1.014 (ref 1.005–1.030)
Urobilinogen, UA: 1 mg/dL (ref 0.0–1.0)
pH: 8 (ref 5.0–8.0)

## 2015-01-26 LAB — URINE MICROSCOPIC-ADD ON

## 2015-01-26 MED ORDER — ACETAMINOPHEN 500 MG PO TABS
1000.0000 mg | ORAL_TABLET | Freq: Once | ORAL | Status: AC
  Administered 2015-01-26: 1000 mg via ORAL
  Filled 2015-01-26: qty 2

## 2015-01-26 MED ORDER — TRAMADOL-ACETAMINOPHEN 37.5-325 MG PO TABS: 1.0000 | ORAL_TABLET | Freq: Four times a day (QID) | ORAL | Status: DC | PRN

## 2015-01-26 MED ORDER — CEPHALEXIN 500 MG PO CAPS: 500.0000 mg | ORAL_CAPSULE | Freq: Four times a day (QID) | ORAL | Status: DC

## 2015-01-26 NOTE — ED Notes (Signed)
Grand-son: Will [phone]

## 2015-01-26 NOTE — Telephone Encounter (Signed)
No showed f/u appt for 8:00am on 01/26/15.

## 2015-01-26 NOTE — Patient Instructions (Addendum)
As far as your medications are concerned, I would like to suggest: Steroids.   As far as diagnostic testing: between 3:30-5 go to [address] for an xray of the right hip and pelvis due to fall.  She needs to go her primary care to evaluate the lesions on her arm.   I would like to see you back in 2 months, sooner if we need to. Please call us with any interim questions, concerns, problems, updates or refill requests.   Please also call us for any test results so we can go over those with you on the phone.  My clinical assistant and will answer any of your questions and relay your messages to me and also relay most of my messages to you.   Our phone number is [phone]. We also have an after hours call service for urgent matters and there is a physician on-call for urgent questions. For any emergencies you know to call 911 or go to the nearest emergency room

## 2015-01-26 NOTE — ED Notes (Signed)
Pt has lymphedema bilateral lower legs-- 3+ pitting edema-- family member states not normally this swollen,

## 2015-01-26 NOTE — ED Provider Notes (Signed)
Assumed care from Dr. Reather Converse at shift change. Briefly 64 year old female with ongoing memory issues here after a fall. She was seen by her neurologist earlier today who has been monitoring her over the past several months. 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 She has refused steroids for potential treatment of this. She was sent here for further evaluation of fall and hip pain. X-ray and CT of hip thus far is negative.  Labwork is overall reassuring.  Plan:  U/A pending.  If infectious and patient able to ambulate with cane/walker, plan to discharge home with antibiotics. She has follow-up with neurology next week for temporal artery biopsy.  Results for orders placed or performed during the hospital encounter of 12/25/92  Basic metabolic panel  Result Value Ref Range   Sodium 139 135 - 145 mmol/L   Potassium 4.2 3.5 - 5.1 mmol/L   Chloride 104 101 - 111 mmol/L   CO2 27 22 - 32 mmol/L   Glucose, Bld 132 (H) 65 - 99 mg/dL   BUN 11 6 - 20 mg/dL   Creatinine, Ser 0.69 0.44 - 1.00 mg/dL   Calcium 9.2 8.9 - 10.3 mg/dL   GFR calc non Af Amer >60 >60 mL/min   GFR calc Af Amer >60 >60 mL/min   Anion gap 8 5 - 15  CBC with Differential/Platelet  Result Value Ref Range   WBC 10.9 (H) 4.0 - 10.5 K/uL   RBC 4.44 3.87 - 5.11 MIL/uL   Hemoglobin 12.4 12.0 - 15.0 g/dL   HCT 38.6 36.0 - 46.0 %   MCV 86.9 78.0 - 100.0 fL   MCH 27.9 26.0 - 34.0 pg   MCHC 32.1 30.0 - 36.0 g/dL   RDW 15.2 11.5 - 15.5 %   Platelets 282 150 - 400 K/uL   Neutrophils Relative % 79 (H) 43 - 77 %   Neutro Abs 8.6 (H) 1.7 - 7.7 K/uL   Lymphocytes Relative 12 12 - 46 %   Lymphs Abs 1.3 0.7 - 4.0 K/uL   Monocytes Relative 9 3 - 12 %   Monocytes Absolute 0.9 0.1 - 1.0 K/uL   Eosinophils Relative 0 0 - 5 %   Eosinophils Absolute 0.0 0.0 - 0.7 K/uL   Basophils Relative 0 0 - 1 %   Basophils Absolute 0.0 0.0 - 0.1 K/uL  Urinalysis, Routine w reflex microscopic  (not at [facility])  Result Value Ref Range   Color, Urine YELLOW YELLOW   APPearance TURBID (A) CLEAR   Specific Gravity, Urine 1.014 1.005 - 1.030   pH 8.0 5.0 - 8.0   Glucose, UA NEGATIVE NEGATIVE mg/dL   Hgb urine dipstick NEGATIVE NEGATIVE   Bilirubin Urine NEGATIVE NEGATIVE   Ketones, ur NEGATIVE NEGATIVE mg/dL   Protein, ur NEGATIVE NEGATIVE mg/dL   Urobilinogen, UA 1.0 0.0 - 1.0 mg/dL   Nitrite NEGATIVE NEGATIVE   Leukocytes, UA NEGATIVE NEGATIVE  Urine microscopic-add on  Result Value Ref Range   Squamous Epithelial / LPF FEW (A) RARE   WBC, UA 0-2 <3 WBC/hpf   Bacteria, UA MANY (A) RARE   Ct Head Wo Contrast  01/10/2015   CLINICAL DATA:  Altered mental status  EXAM: CT HEAD WITHOUT CONTRAST  TECHNIQUE: Contiguous axial images were obtained from the base of the skull through the vertex without intravenous contrast.  COMPARISON:  September 10, 2014  FINDINGS: Mild diffuse atrophy is stable. There is no intracranial mass,  hemorrhage, extra-axial fluid collection, or midline shift. There is patchy small vessel disease throughout the centra semiovale bilaterally. Elsewhere gray-white compartments are normal. No acute infarct is apparent. The bony calvarium appears intact. There is mild chronic thickening of several posterior inferior mastoid air cells bilaterally. Remainder the mastoid air cells bilaterally are clear in stable.  IMPRESSION: Stable atrophy with periventricular small vessel disease. Mild chronic mastoid thickening inferiorly on both sides. No intracranial mass, hemorrhage, or acute appearing infarct. No new mastoid disease.   Electronically Signed   By: Lowella Grip III M.D.   On: 01/10/2015 15:20   Ct Abdomen Pelvis W Contrast  01/08/2015   CLINICAL DATA:  Generalized abdominal pain and constipation.  EXAM: CT ABDOMEN AND PELVIS WITH CONTRAST  TECHNIQUE: Multidetector CT imaging of the abdomen and pelvis was performed using the standard protocol following bolus administration  of intravenous contrast.  CONTRAST:  156mL OMNIPAQUE IOHEXOL 300 MG/ML SOLN, 26mL OMNIPAQUE IOHEXOL 300 MG/ML SOLN  COMPARISON:  12/04/2014  FINDINGS: Lower chest: The lung bases are clear of acute process. Minimal scarring and/or atelectasis. The heart is normal in size. No pericardial effusion. Coronary artery calcifications are noted. The distal esophagus is grossly normal.  Hepatobiliary: No focal hepatic lesions or intrahepatic biliary dilatation. The gallbladder is normal. No common bile duct dilatation.  Pancreas: Moderate atrophy but no mass or inflammation.  Spleen: Normal size.  No focal lesions.  Adrenals/Urinary Tract: Stable bilateral adrenal gland adenomas.  Bilateral renal calculi but no obstructing ureteral calculi or bladder calculi. Both kidneys demonstrate normal enhancement/ perfusion. No worrisome renal lesions. The delayed images do not demonstrate any significant collecting system abnormality.  Stomach/Bowel: The stomach, duodenum, small bowel and colon are unremarkable. No inflammatory changes, mass lesions or obstructive findings.  Vascular/Lymphatic: Advanced atherosclerotic calcifications involving the aorta and branch vessels but no focal aneurysm. The major venous structures are patent. Stable IVC filter.  Other: The uterus is surgically absent. There is a mild cystocele noted. No pelvic mass or adenopathy. No free pelvic fluid collections. No inguinal mass or adenopathy.  Musculoskeletal: Numerous thoracic and lumbar compression fractures. T8, T11 and L1 are chronic. The since the prior study there is a new compression fracture of L3 and further compression of L4.  IMPRESSION: 1. No acute abdominal/pelvic findings, mass lesions or adenopathy. 2. New compression fracture of L3 a since the prior CT scan and further compression of L4. Remote fractures of T8, T11 and L1. 3. Bilateral renal calculi but no obstructing ureteral calculi or bladder calculi. 4. Stable bilateral adrenal gland  adenomas. 5. Stable advanced atherosclerotic calcifications involving the aorta and branch vessels. 6. Mild cystocele. 7. Stable IVC filter.   Electronically Signed   By: Marijo Sanes M.D.   On: 01/08/2015 18:16   Ct Hip Right Wo Contrast  01/26/2015   CLINICAL DATA:  Fall.  RIGHT hip pain.  Bilateral leg edema.  EXAM: CT OF THE RIGHT HIP WITHOUT CONTRAST  TECHNIQUE: Multidetector CT imaging of the right hip was performed according to the standard protocol. Multiplanar CT image reconstructions were also generated.  COMPARISON:  None.  FINDINGS: Proximal RIGHT femur is intact. RIGHT hip joint space appears preserved. No marginal osteophytes at the RIGHT hip. RIGHT obturator ring appears normal. Lumbar spondylosis is partially visible. RIGHT sacroiliac joint osteoarthritis. Chondrocalcinosis at the pubic symphysis. Atherosclerosis. Hysterectomy incidentally noted.  If there is high clinical suspicion for occult hip fracture or the patient refuses to weightbear, consider further evaluation with MRI. Although  CT is expeditious, evidence is lacking regarding accuracy of CT over plain film radiography.  IMPRESSION: No displaced RIGHT hip fracture.   Electronically Signed   By: Dereck Ligas M.D.   On: 01/26/2015 16:03   US Abdomen Limited  01/09/2015   CLINICAL DATA:  Right upper quadrant abdominal pain. Hypertension. Diabetes.  EXAM: US ABDOMEN LIMITED - RIGHT UPPER QUADRANT  COMPARISON:  CT scan of 01/08/2015  FINDINGS: Gallbladder:  The small amount of sludge observed dependently in the gallbladder. No definite gallstone. No gallbladder wall thickening. Sonographic Murphy's sign absent.  Common bile duct:  Diameter: 4 mm, within normal limits.  Liver:  No focal lesion identified. Within normal limits in parenchymal echogenicity. Reduced sensitivity due to difficulty with sonic windows.  On focused review of recent CT scans, it is my impression that the patient has a healing lower sternal fracture.  IMPRESSION:  1. Small amount of sludge in the gallbladder, without gallbladder wall thickening, pericholecystic fluid, or sonographic Murphy's sign. 2. Serial review of recent CT scans makes me suspect that the patient probably has a healing lower sternal fracture, which could possibly be contributing to pain in this vicinity. 3. Characterization of the liver is slightly limited due to poor sonic windows, but on yesterday's CT scan no specific hepatic abnormality was observed.   Electronically Signed   By: Van Clines M.D.   On: 01/09/2015 17:21   Dg Hip Unilat  With Pelvis 2-3 Views Right  01/26/2015   CLINICAL DATA:  Acute right hip pain after fall this morning. Initial encounter.  EXAM: DG HIP (WITH OR WITHOUT PELVIS) 2-3V RIGHT  COMPARISON:  None.  FINDINGS: There is no evidence of hip fracture or dislocation. There is no evidence of arthropathy or other focal bone abnormality.  IMPRESSION: Normal right hip.   Electronically Signed   By: Marijo Conception, M.D.   On: 01/26/2015 12:51    UA does appear infectious with many bacteria noted. Patient was able to ambulate with walker. She states she feels much better than earlier this morning. No dizziness or syncope when walking. Vital signs remained stable.  Will d/c home with keflex and ultracet as patient states she tolerates tramadol well however still some pain when taking this--- i recommended combination pill to avoid accidental tylenol overdose as patient was confused about dosing and cannot tolerate NSAIDs due to GI issues.  Grandson will help with medication administration.  FU with neurology and PCP.  Discussed plan with patient and grandson, they acknowledged understanding and agreed with plan of care.  Return precautions given for new or worsening symptoms.  Larene Pickett, PA-C 01/26/15 [place], MD 01/26/15 [id number]

## 2015-01-26 NOTE — ED Notes (Addendum)
PA at bedside speaking with pt and grandson

## 2015-01-26 NOTE — Discharge Instructions (Signed)
Take the prescribed medication as directed for pain. Follow-up with your neurologist. Also recommend to follow-up with your primary care physician. Return to the ED for new or worsening symptoms.

## 2015-01-26 NOTE — ED Provider Notes (Signed)
CSN: [id number]     Arrival date & time 01/26/15  1208 History   First MD Initiated Contact with Patient 01/26/15 1307     Chief Complaint  Patient presents with  . Fall  . Hip Pain     (Consider location/radiation/quality/duration/timing/severity/associated sxs/prior Treatment) HPI Comments: 64 year old female with CAD, temporal arteritis, diabetes, gait instability, lymphedema presents after fall. Patient recalls details she felt a cramp in her leg leading her to fall on the right side. Patient is mild tenderness with flexion palpation. Patient denies any head injury no loss of consciousness. Patient denies chest measures of breath. Past smoker. The son states she has had a few episodes of confusion and hallucinations the past few days including today however currently is doing well at her baseline. Patient was sent over for further workup of a fall from primary doctor. Patient not on any sterile currently per the son was on 2 months prior.  Patient is a 64 y.o. female presenting with fall and hip pain. The history is provided by the patient and a relative.  Fall Pertinent negatives include no chest pain, no abdominal pain, no headaches and no shortness of breath.  Hip Pain Pertinent negatives include no chest pain, no abdominal pain, no headaches and no shortness of breath.    Past Medical History  Diagnosis Date  . Uterine cancer   . DVT (deep venous thrombosis)   . Lymphedema of both lower extremities   . Obesity   . Tobacco abuse   . Arthritis   . Fractures     compression (2)  . Family history of adverse reaction to anesthesia     pt. states that brother and dad become aggressive when trying to wake up  . Diabetes mellitus without complication     pt. states that she no longer has diabetes  . Hypertension     pt. states history of  . COPD (chronic obstructive pulmonary disease)     pt. states that one doctor says she does but one doctor says no  . History of pneumonia    . History of bronchitis   . Urinary incontinence   . GERD (gastroesophageal reflux disease)    Past Surgical History  Procedure Laterality Date  . Tubal ligation    . Abdominal hysterectomy    . Back surgery    . Cystocele repair    . Colonoscopy    . Esophagogastroduodenoscopy    . Eye surgery Bilateral     cataracts  . Intraoperative arteriogram     Family History  Problem Relation Age of Onset  . Hypertension     History  Substance Use Topics  . Smoking status: Former Smoker -- 0.75 packs/day for 47 years    Types: Cigarettes    Quit date: 12/26/2014  . Smokeless tobacco: Never Used  . Alcohol Use: No   OB History    No data available     Review of Systems  Constitutional: Negative for fever and chills.  HENT: Negative for congestion.   Eyes: Negative for visual disturbance.  Respiratory: Negative for shortness of breath.   Cardiovascular: Negative for chest pain.  Gastrointestinal: Negative for vomiting and abdominal pain.  Genitourinary: Negative for dysuria and flank pain.  Musculoskeletal: Positive for arthralgias. Negative for back pain, neck pain and neck stiffness.  Skin: Negative for rash.  Neurological: Negative for light-headedness and headaches.      Allergies  Darvon; Percocet; and Valium  Home Medications   Prior to  Admission medications   Medication Sig Start Date End Date Taking? Authorizing Provider  atorvastatin (LIPITOR) 20 MG tablet Take 20 mg by mouth daily at 6 PM.  11/11/13  Yes Historical Provider, MD  DEXILANT 60 MG capsule Take 60 mg by mouth daily.  11/11/13  Yes Historical Provider, MD  ibuprofen (ADVIL,MOTRIN) 200 MG tablet Take 400 mg by mouth every 6 (six) hours as needed for moderate pain.   Yes Historical Provider, MD  montelukast (SINGULAIR) 10 MG tablet Take 10 mg by mouth at bedtime.  11/11/13  Yes Historical Provider, MD  cephALEXin (KEFLEX) 500 MG capsule Take 1 capsule (500 mg total) by mouth 4 (four) times daily.  01/26/15   Larene Pickett, PA-C  traMADol-acetaminophen (ULTRACET) 37.5-325 MG per tablet Take 1 tablet by mouth every 6 (six) hours as needed. 01/26/15   Larene Pickett, PA-C  Vitamin D, Ergocalciferol, (DRISDOL) 50000 UNITS CAPS capsule Take 1 capsule (50,000 Units total) by mouth every 7 (seven) days. Patient not taking: Reported on 01/20/2015 06/15/14   Dellia Nims, MD  warfarin (COUMADIN) 1 MG tablet Take 3.5 mg by mouth daily.    Historical Provider, MD   BP 163/67 mmHg  Pulse 95  Temp(Src) 98.1 F (36.7 C) (Oral)  Resp 16  SpO2 95% Physical Exam  Constitutional: She is oriented to person, place, and time. She appears well-developed and well-nourished.  HENT:  Head: Normocephalic and atraumatic.  Eyes: Right eye exhibits no discharge. Left eye exhibits no discharge.  Neck: Normal range of motion. Neck supple. No tracheal deviation present.  Cardiovascular: Normal rate and regular rhythm.   Pulmonary/Chest: Effort normal.  Abdominal: Soft. She exhibits no distension. There is no tenderness. There is no guarding.  Musculoskeletal: She exhibits tenderness. She exhibits no edema.  Mild tenderness with right hip flexion and palpation lateral difficult exam due to obesity. Full range of motion of hip. No tenderness left lower extremity. Deconditioned. No focal knee tenderness or ankle tenderness. No shoulder tenderness. No rib flank tenderness on the right where she fell. Neck supple no midline cervical tenderness full range of motion.  Neurological: She is alert and oriented to person, place, and time. No cranial nerve deficit.  Skin: Skin is warm. No rash noted.  Psychiatric: She has a normal mood and affect.  Nursing note and vitals reviewed.   ED Course  Procedures (including critical care time) Labs Review Labs Reviewed  BASIC METABOLIC PANEL - Abnormal; Notable for the following:    Glucose, Bld 132 (*)    All other components within normal limits  CBC WITH  DIFFERENTIAL/PLATELET - Abnormal; Notable for the following:    WBC 10.9 (*)    Neutrophils Relative % 79 (*)    Neutro Abs 8.6 (*)    All other components within normal limits  URINALYSIS, ROUTINE W REFLEX MICROSCOPIC (NOT AT [facility]) - Abnormal; Notable for the following:    APPearance TURBID (*)    All other components within normal limits  URINE MICROSCOPIC-ADD ON - Abnormal; Notable for the following:    Squamous Epithelial / LPF FEW (*)    Bacteria, UA MANY (*)    All other components within normal limits  URINE CULTURE    Imaging Review No results found. Ct Head Wo Contrast  01/10/2015   CLINICAL DATA:  Altered mental status  EXAM: CT HEAD WITHOUT CONTRAST  TECHNIQUE: Contiguous axial images were obtained from the base of the skull through the vertex without intravenous contrast.  COMPARISON:  September 10, 2014  FINDINGS: Mild diffuse atrophy is stable. There is no intracranial mass, hemorrhage, extra-axial fluid collection, or midline shift. There is patchy small vessel disease throughout the centra semiovale bilaterally. Elsewhere gray-white compartments are normal. No acute infarct is apparent. The bony calvarium appears intact. There is mild chronic thickening of several posterior inferior mastoid air cells bilaterally. Remainder the mastoid air cells bilaterally are clear in stable.  IMPRESSION: Stable atrophy with periventricular small vessel disease. Mild chronic mastoid thickening inferiorly on both sides. No intracranial mass, hemorrhage, or acute appearing infarct. No new mastoid disease.   Electronically Signed   By: Lowella Grip III M.D.   On: 01/10/2015 15:20   Ct Abdomen Pelvis W Contrast  01/08/2015   CLINICAL DATA:  Generalized abdominal pain and constipation.  EXAM: CT ABDOMEN AND PELVIS WITH CONTRAST  TECHNIQUE: Multidetector CT imaging of the abdomen and pelvis was performed using the standard protocol following bolus administration of intravenous contrast.  CONTRAST:   115mL OMNIPAQUE IOHEXOL 300 MG/ML SOLN, 7mL OMNIPAQUE IOHEXOL 300 MG/ML SOLN  COMPARISON:  12/04/2014  FINDINGS: Lower chest: The lung bases are clear of acute process. Minimal scarring and/or atelectasis. The heart is normal in size. No pericardial effusion. Coronary artery calcifications are noted. The distal esophagus is grossly normal.  Hepatobiliary: No focal hepatic lesions or intrahepatic biliary dilatation. The gallbladder is normal. No common bile duct dilatation.  Pancreas: Moderate atrophy but no mass or inflammation.  Spleen: Normal size.  No focal lesions.  Adrenals/Urinary Tract: Stable bilateral adrenal gland adenomas.  Bilateral renal calculi but no obstructing ureteral calculi or bladder calculi. Both kidneys demonstrate normal enhancement/ perfusion. No worrisome renal lesions. The delayed images do not demonstrate any significant collecting system abnormality.  Stomach/Bowel: The stomach, duodenum, small bowel and colon are unremarkable. No inflammatory changes, mass lesions or obstructive findings.  Vascular/Lymphatic: Advanced atherosclerotic calcifications involving the aorta and branch vessels but no focal aneurysm. The major venous structures are patent. Stable IVC filter.  Other: The uterus is surgically absent. There is a mild cystocele noted. No pelvic mass or adenopathy. No free pelvic fluid collections. No inguinal mass or adenopathy.  Musculoskeletal: Numerous thoracic and lumbar compression fractures. T8, T11 and L1 are chronic. The since the prior study there is a new compression fracture of L3 and further compression of L4.  IMPRESSION: 1. No acute abdominal/pelvic findings, mass lesions or adenopathy. 2. New compression fracture of L3 a since the prior CT scan and further compression of L4. Remote fractures of T8, T11 and L1. 3. Bilateral renal calculi but no obstructing ureteral calculi or bladder calculi. 4. Stable bilateral adrenal gland adenomas. 5. Stable advanced  atherosclerotic calcifications involving the aorta and branch vessels. 6. Mild cystocele. 7. Stable IVC filter.   Electronically Signed   By: Marijo Sanes M.D.   On: 01/08/2015 18:16   Ct Hip Right Wo Contrast  01/26/2015   CLINICAL DATA:  Fall.  RIGHT hip pain.  Bilateral leg edema.  EXAM: CT OF THE RIGHT HIP WITHOUT CONTRAST  TECHNIQUE: Multidetector CT imaging of the right hip was performed according to the standard protocol. Multiplanar CT image reconstructions were also generated.  COMPARISON:  None.  FINDINGS: Proximal RIGHT femur is intact. RIGHT hip joint space appears preserved. No marginal osteophytes at the RIGHT hip. RIGHT obturator ring appears normal. Lumbar spondylosis is partially visible. RIGHT sacroiliac joint osteoarthritis. Chondrocalcinosis at the pubic symphysis. Atherosclerosis. Hysterectomy incidentally noted.  If there is high clinical suspicion for  occult hip fracture or the patient refuses to weightbear, consider further evaluation with MRI. Although CT is expeditious, evidence is lacking regarding accuracy of CT over plain film radiography.  IMPRESSION: No displaced RIGHT hip fracture.   Electronically Signed   By: Dereck Ligas M.D.   On: 01/26/2015 16:03   US Abdomen Limited  01/09/2015   CLINICAL DATA:  Right upper quadrant abdominal pain. Hypertension. Diabetes.  EXAM: US ABDOMEN LIMITED - RIGHT UPPER QUADRANT  COMPARISON:  CT scan of 01/08/2015  FINDINGS: Gallbladder:  The small amount of sludge observed dependently in the gallbladder. No definite gallstone. No gallbladder wall thickening. Sonographic Murphy's sign absent.  Common bile duct:  Diameter: 4 mm, within normal limits.  Liver:  No focal lesion identified. Within normal limits in parenchymal echogenicity. Reduced sensitivity due to difficulty with sonic windows.  On focused review of recent CT scans, it is my impression that the patient has a healing lower sternal fracture.  IMPRESSION: 1. Small amount of sludge in  the gallbladder, without gallbladder wall thickening, pericholecystic fluid, or sonographic Murphy's sign. 2. Serial review of recent CT scans makes me suspect that the patient probably has a healing lower sternal fracture, which could possibly be contributing to pain in this vicinity. 3. Characterization of the liver is slightly limited due to poor sonic windows, but on yesterday's CT scan no specific hepatic abnormality was observed.   Electronically Signed   By: Van Clines M.D.   On: 01/09/2015 17:21   Dg Hip Unilat  With Pelvis 2-3 Views Right  01/26/2015   CLINICAL DATA:  Acute right hip pain after fall this morning. Initial encounter.  EXAM: DG HIP (WITH OR WITHOUT PELVIS) 2-3V RIGHT  COMPARISON:  None.  FINDINGS: There is no evidence of hip fracture or dislocation. There is no evidence of arthropathy or other focal bone abnormality.  IMPRESSION: Normal right hip.   Electronically Signed   By: Marijo Conception, M.D.   On: 01/26/2015 12:51     EKG Interpretation None      MDM   Final diagnoses:  Acute hip pain, right  Fall, initial encounter   Patient presents after ground-level fall, mechanical recalls all details. Plan for x-ray, reviewed no acute fracture seen. With persistent pain plan for CT scan of the right hip for further delineation. Mild white blood cell count elevation no fever. Plan for urinalysis with intermittent hallucinations recently.  Patient's final disposition signed out with plan a follow-up urinalysis and ambulate prior to discharge.      Elnora Morrison, MD 01/29/15 [phone]
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
## 2015-01-26 NOTE — ED Notes (Signed)
Pt here for fall this am and having right hip pain. sts she didn't hit head but the doctor that sent her here wants to make sure with a CT scan.

## 2015-01-26 NOTE — ED Notes (Signed)
Pt assisted on to bedpan, U/A collected. Rectal temp-- 98.7-- rechecked twice.

## 2015-01-27 LAB — C-REACTIVE PROTEIN: CRP: 15.2 mg/L — AB (ref 0.0–4.9)

## 2015-01-27 LAB — SEDIMENTATION RATE: SED RATE: 8 mm/h (ref 0–40)

## 2015-01-28 ENCOUNTER — Telehealth: Payer: Self-pay | Admitting: Neurology

## 2015-01-28 LAB — URINE CULTURE

## 2015-01-28 NOTE — Telephone Encounter (Signed)
Called patient. ESr and CRP are elevated. She declines treatment. She has not complied with medical management in the past for temporal artery biopsy. Advised her that I cannot tell if she has temporal arteritis, esr and crp are not specific, but that elevated values with her symptoms may indeed indicate this diagnosis.  But delaying treatment can cause permanent blindness. Spoke to grandson, told him the same. Discussed permanaent blindness. He says they have biopsy on Tuesday. Advised him that blindness can happen anytime even as they wait for biopsy. If vision declines, go immediately to the emergency room.

## 2015-01-31 ENCOUNTER — Emergency Department (HOSPITAL_COMMUNITY): Payer: Medicare Other

## 2015-01-31 ENCOUNTER — Observation Stay (HOSPITAL_COMMUNITY)
Admission: EM | Admit: 2015-01-31 | Discharge: 2015-02-01 | Disposition: A | Discharge status: Home / Self Care | Payer: Medicare Other | Attending: Internal Medicine | Admitting: Internal Medicine

## 2015-01-31 ENCOUNTER — Other Ambulatory Visit: Payer: Self-pay | Admitting: Radiology

## 2015-01-31 ENCOUNTER — Encounter (HOSPITAL_COMMUNITY): Payer: Self-pay | Admitting: Emergency Medicine

## 2015-01-31 DIAGNOSIS — F17210 Nicotine dependence, cigarettes, uncomplicated: Secondary | ICD-10-CM | POA: Insufficient documentation

## 2015-01-31 DIAGNOSIS — E1165 Type 2 diabetes mellitus with hyperglycemia: Secondary | ICD-10-CM | POA: Diagnosis not present

## 2015-01-31 DIAGNOSIS — IMO0002 Reserved for concepts with insufficient information to code with codable children: Secondary | ICD-10-CM | POA: Diagnosis present

## 2015-01-31 DIAGNOSIS — J449 Chronic obstructive pulmonary disease, unspecified: Secondary | ICD-10-CM | POA: Diagnosis not present

## 2015-01-31 DIAGNOSIS — M549 Dorsalgia, unspecified: Secondary | ICD-10-CM | POA: Diagnosis present

## 2015-01-31 DIAGNOSIS — M1990 Unspecified osteoarthritis, unspecified site: Secondary | ICD-10-CM | POA: Diagnosis not present

## 2015-01-31 DIAGNOSIS — R269 Unspecified abnormalities of gait and mobility: Secondary | ICD-10-CM | POA: Diagnosis not present

## 2015-01-31 DIAGNOSIS — E780 Pure hypercholesterolemia: Secondary | ICD-10-CM | POA: Diagnosis not present

## 2015-01-31 DIAGNOSIS — I10 Essential (primary) hypertension: Secondary | ICD-10-CM | POA: Diagnosis not present

## 2015-01-31 DIAGNOSIS — I82509 Chronic embolism and thrombosis of unspecified deep veins of unspecified lower extremity: Secondary | ICD-10-CM | POA: Insufficient documentation

## 2015-01-31 DIAGNOSIS — I2510 Atherosclerotic heart disease of native coronary artery without angina pectoris: Secondary | ICD-10-CM | POA: Insufficient documentation

## 2015-01-31 DIAGNOSIS — M545 Low back pain: Secondary | ICD-10-CM

## 2015-01-31 DIAGNOSIS — G9340 Encephalopathy, unspecified: Secondary | ICD-10-CM | POA: Diagnosis present

## 2015-01-31 DIAGNOSIS — M5489 Other dorsalgia: Secondary | ICD-10-CM | POA: Diagnosis present

## 2015-01-31 DIAGNOSIS — M25551 Pain in right hip: Secondary | ICD-10-CM | POA: Diagnosis present

## 2015-01-31 DIAGNOSIS — R519 Headache, unspecified: Secondary | ICD-10-CM | POA: Diagnosis present

## 2015-01-31 DIAGNOSIS — R2681 Unsteadiness on feet: Secondary | ICD-10-CM | POA: Diagnosis present

## 2015-01-31 DIAGNOSIS — I890 Lymphedema, not elsewhere classified: Secondary | ICD-10-CM | POA: Diagnosis not present

## 2015-01-31 DIAGNOSIS — R51 Headache: Principal | ICD-10-CM | POA: Insufficient documentation

## 2015-01-31 DIAGNOSIS — Z794 Long term (current) use of insulin: Secondary | ICD-10-CM | POA: Insufficient documentation

## 2015-01-31 HISTORY — DX: Claustrophobia: F40.240

## 2015-01-31 HISTORY — DX: Pure hypercholesterolemia, unspecified: E78.00

## 2015-01-31 HISTORY — DX: Type 2 diabetes mellitus without complications: E11.9

## 2015-01-31 HISTORY — DX: Other chronic pain: G89.29

## 2015-01-31 HISTORY — DX: Headache: R51

## 2015-01-31 HISTORY — DX: Headache, unspecified: R51.9

## 2015-01-31 HISTORY — DX: Unspecified chronic bronchitis: J42

## 2015-01-31 HISTORY — DX: Pneumonia, unspecified organism: J18.9

## 2015-01-31 HISTORY — DX: Personal history of peptic ulcer disease: Z87.11

## 2015-01-31 HISTORY — DX: Personal history of other diseases of the digestive system: Z87.19

## 2015-01-31 HISTORY — DX: Dorsalgia, unspecified: M54.9

## 2015-01-31 HISTORY — DX: Migraine, unspecified, not intractable, without status migrainosus: G43.909

## 2015-01-31 LAB — URINALYSIS, ROUTINE W REFLEX MICROSCOPIC
Bilirubin Urine: NEGATIVE
Glucose, UA: NEGATIVE mg/dL
Hgb urine dipstick: NEGATIVE
Ketones, ur: NEGATIVE mg/dL
Leukocytes, UA: NEGATIVE
Nitrite: NEGATIVE
Protein, ur: NEGATIVE mg/dL
Specific Gravity, Urine: 1.014 (ref 1.005–1.030)
Urobilinogen, UA: 1 mg/dL (ref 0.0–1.0)
pH: 7 (ref 5.0–8.0)

## 2015-01-31 LAB — CBC WITH DIFFERENTIAL/PLATELET
Basophils Absolute: 0 10*3/uL (ref 0.0–0.1)
Basophils Relative: 0 % (ref 0–1)
Eosinophils Absolute: 0.3 10*3/uL (ref 0.0–0.7)
Eosinophils Relative: 3 % (ref 0–5)
HCT: 37.2 % (ref 36.0–46.0)
Hemoglobin: 11.7 g/dL — ABNORMAL LOW (ref 12.0–15.0)
Lymphocytes Relative: 20 % (ref 12–46)
Lymphs Abs: 1.7 10*3/uL (ref 0.7–4.0)
MCH: 27.6 pg (ref 26.0–34.0)
MCHC: 31.5 g/dL (ref 30.0–36.0)
MCV: 87.7 fL (ref 78.0–100.0)
Monocytes Absolute: 0.8 10*3/uL (ref 0.1–1.0)
Monocytes Relative: 10 % (ref 3–12)
Neutro Abs: 5.9 10*3/uL (ref 1.7–7.7)
Neutrophils Relative %: 67 % (ref 43–77)
Platelets: 299 10*3/uL (ref 150–400)
RBC: 4.24 MIL/uL (ref 3.87–5.11)
RDW: 15.4 % (ref 11.5–15.5)
WBC: 8.7 10*3/uL (ref 4.0–10.5)

## 2015-01-31 LAB — PROTIME-INR
INR: 1.09 (ref 0.00–1.49)
Prothrombin Time: 14.3 seconds (ref 11.6–15.2)

## 2015-01-31 LAB — BASIC METABOLIC PANEL
Anion gap: 9 (ref 5–15)
BUN: 9 mg/dL (ref 6–20)
CO2: 26 mmol/L (ref 22–32)
Calcium: 8.8 mg/dL — ABNORMAL LOW (ref 8.9–10.3)
Chloride: 105 mmol/L (ref 101–111)
Creatinine, Ser: 0.62 mg/dL (ref 0.44–1.00)
GFR calc Af Amer: >60 mL/min (ref >60)
GFR calc non Af Amer: >60 mL/min (ref >60)
Glucose, Bld: 184 mg/dL — ABNORMAL HIGH (ref 65–99)
Potassium: 2.9 mmol/L — ABNORMAL LOW (ref 3.5–5.1)
Sodium: 140 mmol/L (ref 135–145)

## 2015-01-31 MED ORDER — ONDANSETRON HCL 4 MG PO TABS: 4.0000 mg | ORAL_TABLET | Freq: Four times a day (QID) | ORAL | Status: DC | PRN

## 2015-01-31 MED ORDER — BUTALBITAL-APAP-CAFFEINE 50-325-40 MG PO TABS: 1.0000 | ORAL_TABLET | ORAL | Status: DC | PRN

## 2015-01-31 MED ORDER — HYDROCODONE-ACETAMINOPHEN 7.5-325 MG PO TABS: 1.0000 | ORAL_TABLET | Freq: Four times a day (QID) | ORAL | Status: DC | PRN

## 2015-01-31 MED ORDER — CEPHALEXIN 500 MG PO CAPS
500.0000 mg | ORAL_CAPSULE | Freq: Three times a day (TID) | ORAL | Status: DC
Start: 2015-01-31 — End: 2015-02-01
  Administered 2015-01-31 – 2015-02-01 (×3): 500 mg via ORAL
  Filled 2015-01-31 (×3): qty 1

## 2015-01-31 MED ORDER — ONDANSETRON 4 MG PO TBDP: 4.0000 mg | ORAL_TABLET | Freq: Once | ORAL | Status: DC

## 2015-01-31 MED ORDER — ONDANSETRON HCL 4 MG/2ML IJ SOLN: 4.0000 mg | Freq: Once | INTRAMUSCULAR | Status: DC

## 2015-01-31 MED ORDER — PANTOPRAZOLE SODIUM 40 MG PO TBEC
40.0000 mg | DELAYED_RELEASE_TABLET | Freq: Two times a day (BID) | ORAL | Status: DC
  Administered 2015-01-31 – 2015-02-01 (×3): 40 mg via ORAL
  Filled 2015-01-31 (×3): qty 1

## 2015-01-31 MED ORDER — HYDROCODONE-ACETAMINOPHEN 5-325 MG PO TABS: 1.0000 | ORAL_TABLET | Freq: Four times a day (QID) | ORAL | Status: DC | PRN

## 2015-01-31 MED ORDER — METOCLOPRAMIDE HCL 5 MG/ML IJ SOLN
10.0000 mg | Freq: Once | INTRAMUSCULAR | Status: AC
  Administered 2015-01-31: 10 mg via INTRAVENOUS
  Filled 2015-01-31: qty 2

## 2015-01-31 MED ORDER — ONDANSETRON HCL 4 MG/2ML IJ SOLN
4.0000 mg | Freq: Once | INTRAMUSCULAR | Status: AC
  Administered 2015-01-31: 4 mg via INTRAVENOUS
  Filled 2015-01-31: qty 2

## 2015-01-31 MED ORDER — FENTANYL CITRATE (PF) 100 MCG/2ML IJ SOLN: 50.0000 ug | Freq: Once | INTRAMUSCULAR | Status: DC

## 2015-01-31 MED ORDER — ACETAMINOPHEN 325 MG PO TABS: 650.0000 mg | ORAL_TABLET | Freq: Four times a day (QID) | ORAL | Status: DC | PRN

## 2015-01-31 MED ORDER — ATORVASTATIN CALCIUM 20 MG PO TABS
20.0000 mg | ORAL_TABLET | Freq: Every day | ORAL | Status: DC
  Administered 2015-02-01: 20 mg via ORAL
  Filled 2015-01-31: qty 1

## 2015-01-31 MED ORDER — LORAZEPAM 0.5 MG PO TABS: 0.5000 mg | ORAL_TABLET | Freq: Three times a day (TID) | ORAL | Status: DC | PRN

## 2015-01-31 MED ORDER — HYDROMORPHONE HCL 1 MG/ML IJ SOLN
1.0000 mg | Freq: Once | INTRAMUSCULAR | Status: AC
  Administered 2015-01-31: 1 mg via INTRAVENOUS
  Filled 2015-01-31: qty 1

## 2015-01-31 MED ORDER — ENSURE ENLIVE PO LIQD: 237.0000 mL | Freq: Two times a day (BID) | ORAL | Status: DC

## 2015-01-31 MED ORDER — ONDANSETRON HCL 4 MG/2ML IJ SOLN: 4.0000 mg | Freq: Four times a day (QID) | INTRAMUSCULAR | Status: DC | PRN

## 2015-01-31 MED ORDER — PANTOPRAZOLE SODIUM 40 MG PO TBEC: 40.0000 mg | DELAYED_RELEASE_TABLET | Freq: Every day | ORAL | Status: DC

## 2015-01-31 MED ORDER — HYDRALAZINE HCL 20 MG/ML IJ SOLN: 10.0000 mg | INTRAMUSCULAR | Status: DC | PRN

## 2015-01-31 MED ORDER — TRAMADOL-ACETAMINOPHEN 37.5-325 MG PO TABS: 1.0000 | ORAL_TABLET | Freq: Four times a day (QID) | ORAL | Status: DC | PRN

## 2015-01-31 MED ORDER — HEPARIN SODIUM (PORCINE) 5000 UNIT/ML IJ SOLN
5000.0000 [IU] | Freq: Three times a day (TID) | INTRAMUSCULAR | Status: DC
  Filled 2015-01-31: qty 1

## 2015-01-31 MED ORDER — SODIUM CHLORIDE 0.9 % IJ SOLN
3.0000 mL | Freq: Two times a day (BID) | INTRAMUSCULAR | Status: DC
  Administered 2015-01-31 – 2015-02-01 (×2): 3 mL via INTRAVENOUS

## 2015-01-31 MED ORDER — TRAMADOL HCL 50 MG PO TABS
50.0000 mg | ORAL_TABLET | Freq: Four times a day (QID) | ORAL | Status: DC | PRN
  Administered 2015-01-31 – 2015-02-01 (×2): 50 mg via ORAL
  Filled 2015-01-31 (×3): qty 1

## 2015-01-31 MED ORDER — FENTANYL CITRATE (PF) 100 MCG/2ML IJ SOLN
50.0000 ug | Freq: Once | INTRAMUSCULAR | Status: AC
  Administered 2015-01-31: 50 ug via INTRAVENOUS
  Filled 2015-01-31: qty 2

## 2015-01-31 MED ORDER — ACETAMINOPHEN 650 MG RE SUPP: 650.0000 mg | Freq: Four times a day (QID) | RECTAL | Status: DC | PRN

## 2015-01-31 NOTE — Discharge Instructions (Signed)
Arthralgia °Your caregiver has diagnosed you as suffering from an arthralgia. Arthralgia means there is pain in a joint. This can come from many reasons including: °· Bruising the joint which causes soreness (inflammation) in the joint. °· Wear and tear on the joints which occur as we grow older (osteoarthritis). °· Overusing the joint. °· Various forms of arthritis. °· Infections of the joint. °Regardless of the cause of pain in your joint, most of these different pains respond to anti-inflammatory drugs and rest. The exception to this is when a joint is infected, and these cases are treated with antibiotics, if it is a bacterial infection. °HOME CARE INSTRUCTIONS  °· Rest the injured area for as long as directed by your caregiver. Then slowly start using the joint as directed by your caregiver and as the pain allows. Crutches as directed may be useful if the ankles, knees or hips are involved. If the knee was splinted or casted, continue use and care as directed. If an stretchy or elastic wrapping bandage has been applied today, it should be removed and re-applied every 3 to 4 hours. It should not be applied tightly, but firmly enough to keep swelling down. Watch toes and feet for swelling, bluish discoloration, coldness, numbness or excessive pain. If any of these problems (symptoms) occur, remove the ace bandage and re-apply more loosely. If these symptoms persist, contact your caregiver or return to this location. °· For the first 24 hours, keep the injured extremity elevated on pillows while lying down. °· Apply ice for 15-20 minutes to the sore joint every couple hours while awake for the first half day. Then 03-04 times per day for the first 48 hours. Put the ice in a plastic bag and place a towel between the bag of ice and your skin. °· Wear any splinting, casting, elastic bandage applications, or slings as instructed. °· Only take over-the-counter or prescription medicines for pain, discomfort, or fever as  directed by your caregiver. Do not use aspirin immediately after the injury unless instructed by your physician. Aspirin can cause increased bleeding and bruising of the tissues. °· If you were given crutches, continue to use them as instructed and do not resume weight bearing on the sore joint until instructed. °Persistent pain and inability to use the sore joint as directed for more than 2 to 3 days are warning signs indicating that you should see a caregiver for a follow-up visit as soon as possible. Initially, a hairline fracture (break in bone) may not be evident on X-rays. Persistent pain and swelling indicate that further evaluation, non-weight bearing or use of the joint (use of crutches or slings as instructed), or further X-rays are indicated. X-rays may sometimes not show a small fracture until a week or 10 days later. Make a follow-up appointment with your own caregiver or one to whom we have referred you. A radiologist (specialist in reading X-rays) may read your X-rays. Make sure you know how you are to obtain your X-ray results. Do not assume everything is normal if you do not hear from us. °SEEK MEDICAL CARE IF: °Bruising, swelling, or pain increases. °SEEK IMMEDIATE MEDICAL CARE IF:  °· Your fingers or toes are numb or blue. °· The pain is not responding to medications and continues to stay the same or get worse. °· The pain in your joint becomes severe. °· You develop a fever over 102° F (38.9° C). °· It becomes impossible to move or use the joint. °MAKE SURE YOU:  °·   Understand these instructions.  Will watch your condition.  Will get help right away if you are not doing well or get worse. Document Released: 06/18/2005 Document Revised: 09/10/2011 Document Reviewed: 02/04/2008 [facility] Patient Information 2015 [place], [place]. This information is not intended to replace advice given to you by your health care provider. Make sure you discuss any questions you have with your health care  provider.  Back Pain, Adult Back pain is very common. The pain often gets better over time. The cause of back pain is usually not dangerous. Most people can learn to manage their back pain on their own.  HOME CARE   Stay active. Start with short walks on flat ground if you can. Try to walk farther each day.  Do not sit, drive, or stand in one place for more than 30 minutes. Do not stay in bed.  Do not avoid exercise or work. Activity can help your back heal faster.  Be careful when you bend or lift an object. Bend at your knees, keep the object close to you, and do not twist.  Sleep on a firm mattress. Lie on your side, and bend your knees. If you lie on your back, put a pillow under your knees.  Only take medicines as told by your doctor.  Put ice on the injured area.  Put ice in a plastic bag.  Place a towel between your skin and the bag.  Leave the ice on for 15-20 minutes, 03-04 times a day for the first 2 to 3 days. After that, you can switch between ice and heat packs.  Ask your doctor about back exercises or massage.  Avoid feeling anxious or stressed. Find good ways to deal with stress, such as exercise. GET HELP RIGHT AWAY IF:   Your pain does not go away with rest or medicine.  Your pain does not go away in 1 week.  You have new problems.  You do not feel well.  The pain spreads into your legs.  You cannot control when you poop (bowel movement) or pee (urinate).  Your arms or legs feel weak or lose feeling (numbness).  You feel sick to your stomach (nauseous) or throw up (vomit).  You have belly (abdominal) pain.  You feel like you may pass out (faint). MAKE SURE YOU:   Understand these instructions.  Will watch your condition.  Will get help right away if you are not doing well or get worse. Document Released: 12/05/2007 Document Revised: 09/10/2011 Document Reviewed: 10/20/2013 [facility] Patient Information 2015 [place], [place]. This information is  not intended to replace advice given to you by your health care provider. Make sure you discuss any questions you have with your health care provider.

## 2015-01-31 NOTE — H&P (Signed)
[facility] History and Physical  Patient: Karen Waters  MRN: [id number]  DOB: [birth year]  DOS: the patient was seen and examined on 01/31/2015 PCP: Alvester Chou, NP  Referring physician: Dr. Wilson Singer Chief Complaint: Headache  HPI: Karen Waters is a 64 y.o. female with Past medical history of chronic headache, chronic DVT, GERD, hypertension, COPD, multiple vertebral fractures, chronic lymphedema, chronic gait instability. The patient is presenting with complaints of headache. Patient has chronic headache and was diagnosed with temporal arteritis was recommended to be on prednisone as per documentation that she refused. She tells me that she has refused because it causes her osteoporosis and compression fractures. He was recently admitted for confusion and was recommended to get MRI of the brain which she refused as she was unable to go through the procedure without an open MRI. She was recommended for temporal artery biopsy which she also refused. She had multiple falls due to gait instability and actually has a home health nurse that comes every day other than Sunday. Since last 2 days she has been complaining of more headache located on both side of the head without any vision changes. Since last one week she has been more confused than her baseline. Since last 3 days she has been more agitated and they have been using as needed Ativan to control her agitation. Today she was more agitated and therefore was brought to the hospital. She complains that her right hip has been hurting more lately and she was diagnosed with UTI and is supposed to be on Keflex.  The patient is coming from home.  At her baseline ambulates without any support And is independent for most of her ADL manages her medication on her own.  Review of Systems: as mentioned in the history of present illness.  A comprehensive review of the other systems is negative.  Past Medical History  Diagnosis Date  . DVT  (deep venous thrombosis) ? date right; 2008 on left    BLE  . Lymphedema of both lower extremities   . Obesity   . Tobacco abuse   . Fractures     compression (2)  . Urinary incontinence   . GERD (gastroesophageal reflux disease)   . Claustrophobia   . Family history of adverse reaction to anesthesia     pt. states that brother and dad become aggressive when trying to wake up  . Hypertension   . Hypercholesterolemia   . COPD (chronic obstructive pulmonary disease)     pt. states that one doctor says she does but one doctor says no  . Pneumonia "several times"  . Chronic bronchitis     "get it just about q yr" (01/31/2015)  . Type II diabetes mellitus     "just when she was on steroids; checks CBG qd; doesn't take RX" (01/31/2015)  . History of stomach ulcers   . Migraine     "haven't had one in a long time" (01/31/2015)  . Daily headache     "all day long" (01/31/2015)  . Arthritis     "knees, finger joints" (01/31/2015)  . Chronic back pain     "mid-back and all the way down" (01/31/2015)  . Uterine cancer    Past Surgical History  Procedure Laterality Date  . Back surgery    . Incontinence surgery    . Colonoscopy    . Esophagogastroduodenoscopy    . Cataract extraction w/ intraocular lens  implant, bilateral Bilateral   . Intraoperative arteriogram    .  Vaginal hysterectomy    . Lumbar disc surgery  X 2    "dislocated disc; ruptured disc" (01/31/2015)   Social History:  reports that she has been smoking Cigarettes.  She has a 5 pack-year smoking history. She has never used smokeless tobacco. She reports that she does not drink alcohol or use illicit drugs.  Allergies  Allergen Reactions  . Darvon [Propoxyphene] Rash and Other (See Comments)    REACTIONS: hallucinations  . Percocet [Oxycodone-Acetaminophen] Other (See Comments)    REACTION: hallucinations  . Valium [Diazepam] Other (See Comments)    Doesn't wanna take med    Family History  Problem Relation Age of Onset    . Hypertension      Prior to Admission medications   Medication Sig Start Date End Date Taking? Authorizing Provider  atorvastatin (LIPITOR) 20 MG tablet Take 20 mg by mouth daily at 6 PM.  11/11/13  Yes Historical Provider, MD  cephALEXin (KEFLEX) 500 MG capsule Take 1 capsule (500 mg total) by mouth 4 (four) times daily. Patient taking differently: Take 500 mg by mouth 3 (three) times daily.  01/26/15  Yes Larene Pickett, PA-C  DEXILANT 60 MG capsule Take 60 mg by mouth daily.  11/11/13  Yes Historical Provider, MD  LORazepam (ATIVAN) 0.5 MG tablet Take 0.5 mg by mouth every 8 (eight) hours as needed for anxiety.   Yes Historical Provider, MD  montelukast (SINGULAIR) 10 MG tablet Take 10 mg by mouth at bedtime.  11/11/13  Yes Historical Provider, MD  traMADol-acetaminophen (ULTRACET) 37.5-325 MG per tablet Take 1 tablet by mouth every 6 (six) hours as needed. Patient taking differently: Take 1 tablet by mouth every 6 (six) hours as needed for moderate pain.  01/26/15  Yes Larene Pickett, PA-C  Vitamin D, Ergocalciferol, (DRISDOL) 50000 UNITS CAPS capsule Take 1 capsule (50,000 Units total) by mouth every 7 (seven) days. Patient not taking: Reported on 01/20/2015 06/15/14   Dellia Nims, MD    Physical Exam: Filed Vitals:   01/31/15 2030 01/31/15 2045 01/31/15 2156 01/31/15 2259  BP: 184/71 180/68 195/76   Pulse: 72 78 76   Temp:   98.2 F (36.8 C)   TempSrc:   Oral   Resp:   18   Weight:      SpO2:  96% 100% 100%    General: Alert, Awake and Oriented to Time, Place and Person. Appear in mild distress Eyes: PERRL ENT: Oral Mucosa clear moist. Neck: no JVD Cardiovascular: S1 and S2 Present, no Murmur, Peripheral Pulses Present Respiratory: Bilateral Air entry equal and Decreased,  Clear to Auscultation, nio Crackles, no wheezes Abdomen: Bowel Sound present, Soft and non tender Skin: no Rash Extremities: Bilateral  Pedal edema, no calf tenderness Neurologic: Grossly no focal neuro  deficit other than generalized weakness and lower extremity   Labs on Admission:  CBC:  Recent Labs Lab 01/26/15 1429 01/31/15 1845  WBC 10.9* 8.7  NEUTROABS 8.6* 5.9  HGB 12.4 11.7*  HCT 38.6 37.2  MCV 86.9 87.7  PLT 282 299    CMP     Component Value Date/Time   NA 140 01/31/2015 1845   K 2.9* 01/31/2015 1845   CL 105 01/31/2015 1845   CO2 26 01/31/2015 1845   GLUCOSE 184* 01/31/2015 1845   BUN 9 01/31/2015 1845   CREATININE 0.62 01/31/2015 1845   CREATININE 0.75 06/21/2014 0928   CALCIUM 8.8* 01/31/2015 1845   PROT 6.0* 01/10/2015 0519   ALBUMIN 2.9* 01/10/2015  0519   AST 14* 01/10/2015 0519   ALT 9* 01/10/2015 0519   ALKPHOS 211* 01/10/2015 0519   BILITOT 0.8 01/10/2015 0519   GFRNONAA >60 01/31/2015 1845   GFRAA >60 01/31/2015 1845    No results for input(s): LIPASE, AMYLASE in the last 168 hours.  No results for input(s): CKTOTAL, CKMB, CKMBINDEX, TROPONINI in the last 168 hours. BNP (last 3 results) No results for input(s): BNP in the last 8760 hours.  ProBNP (last 3 results)  Recent Labs  06/12/14 1906  PROBNP 235.8*     Radiological Exams on Admission: Ct Head Wo Contrast  01/31/2015   CLINICAL DATA:  Status post fall, with chronic headaches. Worsening headache, jaw pain, right hip pain and back pain. Hypertension. Initial encounter.  EXAM: CT HEAD WITHOUT CONTRAST  TECHNIQUE: Contiguous axial images were obtained from the base of the skull through the vertex without intravenous contrast.  COMPARISON:  CT of the head performed 01/10/2015  FINDINGS: There is no evidence of acute infarction, mass lesion, or intra- or extra-axial hemorrhage on CT.  Prominence of the ventricles and sulci reflects mild cortical volume loss. Scattered periventricular and subcortical white matter change likely reflects small vessel ischemic microangiopathy.  The brainstem and fourth ventricle are within normal limits. The basal ganglia are unremarkable in appearance. The  cerebral hemispheres demonstrate grossly normal gray-white differentiation. No mass effect or midline shift is seen.  There is no evidence of fracture; visualized osseous structures are unremarkable in appearance. The orbits are within normal limits. The paranasal sinuses and mastoid air cells are well-aerated. No significant soft tissue abnormalities are seen.  IMPRESSION: 1. No evidence of traumatic intracranial injury or fracture. 2. Mild cortical volume loss and scattered small vessel ischemic microangiopathy.   Electronically Signed   By: Garald Balding M.D.   On: 01/31/2015 17:37   Assessment/Plan Principal Problem:   Headache Active Problems:   CAD, multiple vessel    Lymphedema   Diabetes type 2, uncontrolled   Acute encephalopathy   Gait instability   Back pain   1. Headache The patient is presenting with complaints of generalized weakness and headache. Her symptoms have been consistent with temporal arteritis. She denies having any vision changes. She has refused prednisone in the past multiple times due to osteoporosis. She thinks that she was scheduled for a temporal artery biopsy but she is actually scheduled for KYPHOPLASTY. She is not taking her Coumadin at present for the procedure. At present family is not willing to go for kyphoplasty due to need to rule out temporal arteritis for her.  Currently we will use when necessary pain medication to control her headache. If her symptom does not improve the patient will be seen by neurology. I will check MRI/MRA of brain to rule out any etiology of chronic headache as well as acute encephalopathy.  2.History of DVT. Patient had a history of prior DVT and has an IVC filter. Patient is not taking warfarin for the procedure. Currently holding off on resuming warfarin until MRI is checked.  3. Diabetes mellitus type 2. Recent A1c 9.2. Not on any medications at home. Monitor her on sliding scale.  4. chronic  lymphedema. Recurrent gait instability with frequent falls. He has been taken off of her Lasix for the surgery. Recent PT evaluation did not recommend any further workup. With that close to monitor.  5. Accelerated hypertension. As needed hydralazine.  Advance goals of care discussion: DNR/DNI as per my discussion with patient  DVT Prophylaxis: subcutaneous Heparin Nutrition: Regular diet  Family Communication: family was present at bedside, opportunity was given to ask question and all questions were answered satisfactorily at the time of interview. Disposition: Admitted as observation, Telemetry unit.  Author: Berle Mull, MD [facility] Pager: [phone] 01/31/2015  If 7PM-7AM, please contact night-coverage [email or web address] Password [email or web address]

## 2015-01-31 NOTE — ED Provider Notes (Signed)
CSN: [id number]     Arrival date & time 01/31/15  1527 History   First MD Initiated Contact with Patient 01/31/15 1536     Chief Complaint  Patient presents with  . Pain     (Consider location/radiation/quality/duration/timing/severity/associated sxs/prior Treatment) HPI   64yF with multiple pain complaints. Facial pain, back pain and R hip pain. Facial pain has been ongoing for weeks. Evaluated previously by neurology for the same. Elevated ESR/CRP. Concern for possible temporal arteritis. Had been taking steroids, but not currently. Reports is scheduled for biopsy tomorrow. She is not the greatest historian and not sure of specifics of procedure. It actually appears she has vertebral kyphoplasty scheduled per chart review, not temporal artery biopsy. HA is periorbital. Worse on L side. Sometimes feels in her jaw but not necessarily worse when eating. Denies acute visual change. Back pain is also more chronic in nature. Also R hip pain after recent fall which she was evaluated for in ED and had negative CT then. Can ambulate on it although with pain. Uses walker at baseline.   Past Medical History  Diagnosis Date  . Uterine cancer   . DVT (deep venous thrombosis)   . Lymphedema of both lower extremities   . Obesity   . Tobacco abuse   . Arthritis   . Fractures     compression (2)  . Family history of adverse reaction to anesthesia     pt. states that brother and dad become aggressive when trying to wake up  . Diabetes mellitus without complication     pt. states that she no longer has diabetes  . Hypertension     pt. states history of  . COPD (chronic obstructive pulmonary disease)     pt. states that one doctor says she does but one doctor says no  . History of pneumonia   . History of bronchitis   . Urinary incontinence   . GERD (gastroesophageal reflux disease)    Past Surgical History  Procedure Laterality Date  . Tubal ligation    . Abdominal hysterectomy    . Back  surgery    . Cystocele repair    . Colonoscopy    . Esophagogastroduodenoscopy    . Eye surgery Bilateral     cataracts  . Intraoperative arteriogram     Family History  Problem Relation Age of Onset  . Hypertension     History  Substance Use Topics  . Smoking status: Former Smoker -- 0.75 packs/day for 47 years    Types: Cigarettes    Quit date: 12/26/2014  . Smokeless tobacco: Never Used  . Alcohol Use: No   OB History    No data available     Review of Systems  All systems reviewed and negative, other than as noted in HPI.   Allergies  Darvon; Percocet; and Valium  Home Medications   Prior to Admission medications   Medication Sig Start Date End Date Taking? Authorizing Provider  atorvastatin (LIPITOR) 20 MG tablet Take 20 mg by mouth daily at 6 PM.  11/11/13   Historical Provider, MD  cephALEXin (KEFLEX) 500 MG capsule Take 1 capsule (500 mg total) by mouth 4 (four) times daily. 01/26/15   Larene Pickett, PA-C  DEXILANT 60 MG capsule Take 60 mg by mouth daily.  11/11/13   Historical Provider, MD  ibuprofen (ADVIL,MOTRIN) 200 MG tablet Take 400 mg by mouth every 6 (six) hours as needed for moderate pain.    Historical Provider, MD  montelukast (SINGULAIR) 10 MG tablet Take 10 mg by mouth at bedtime.  11/11/13   Historical Provider, MD  traMADol-acetaminophen (ULTRACET) 37.5-325 MG per tablet Take 1 tablet by mouth every 6 (six) hours as needed. 01/26/15   Larene Pickett, PA-C  Vitamin D, Ergocalciferol, (DRISDOL) 50000 UNITS CAPS capsule Take 1 capsule (50,000 Units total) by mouth every 7 (seven) days. Patient not taking: Reported on 01/20/2015 06/15/14   Dellia Nims, MD  warfarin (COUMADIN) 1 MG tablet Take 3.5 mg by mouth daily.    Historical Provider, MD   BP 177/76 mmHg  Temp(Src) 98.6 F (37 C) (Oral)  Resp 16  Wt 204 lb (92.534 kg)  SpO2 93% Physical Exam  Constitutional: She is oriented to person, place, and time. She appears well-developed and  well-nourished. No distress.  Laying in bed. NAD. Morbidly obese.   HENT:  Head: Normocephalic and atraumatic.  Eyes: Conjunctivae are normal. Pupils are equal, round, and reactive to light. Right eye exhibits no discharge. Left eye exhibits no discharge.  Neck: Neck supple.  No nuchal rigidity  Cardiovascular: Normal rate, regular rhythm and normal heart sounds.  Exam reveals no gallop and no friction rub.   No murmur heard. Pulmonary/Chest: Effort normal and breath sounds normal. No respiratory distress.  Abdominal: Soft. She exhibits no distension. There is no tenderness.  Musculoskeletal: She exhibits no edema or tenderness.  No apparent pain with ROM. Severe, symmetric LE edema.   Neurological: She is alert and oriented to person, place, and time. No cranial nerve deficit. She exhibits normal muscle tone. Coordination normal.  Skin: Skin is warm and dry.  Psychiatric: She has a normal mood and affect. Her behavior is normal. Thought content normal.  Nursing note and vitals reviewed.   ED Course  Procedures (including critical care time) Labs Review Labs Reviewed  CBC WITH DIFFERENTIAL/PLATELET - Abnormal; Notable for the following:    Hemoglobin 11.7 (*)    All other components within normal limits  BASIC METABOLIC PANEL - Abnormal; Notable for the following:    Potassium 2.9 (*)    Glucose, Bld 184 (*)    Calcium 8.8 (*)    All other components within normal limits  PROTIME-INR  URINALYSIS, ROUTINE W REFLEX MICROSCOPIC (NOT AT [facility])    Imaging Review No results found.   EKG Interpretation None      MDM   Final diagnoses:  Nonintractable headache, unspecified chronicity pattern, unspecified headache type  Right hip pain  Midline back pain, unspecified location    64yF with numerous pain complaints. They are more subacute to chronic in nature. Reiterated concerns that HA may be from temproal arteritis and if left untreated that can result in blindness. She  continues to decline steroids. Has medical decision capability and understands risks. Will treat symptoms. Will CT head with being on blood thinner and recent fall, although she reports this HA is consistent with the pain she has been having preceding the fall.   Imaging w/o acute abnormality. Son now at bedside. Requesting pt be admitted for "pain observation." Acknowledged concerns but that several of these issues have been ongoing for months and not something that would necessarily "be fixed" with an admission. He cites inability to adequately take care of her at home despite home health aide. Requesting that patient be transferred to [facility] so she can be evaluated there. Explained that there is no medical necessity to do this.     Virgel Manifold, MD 02/02/15 [phone]

## 2015-01-31 NOTE — ED Notes (Signed)
Pt fell last Wednesday and had right hip xrayed due to pain. Xray was negative. Pt has chronic generalized pain. She has chronic headaches. Today is complaining of headache, jaw pain, right hip pain, back pain. BP 160/88, HR 80. Pt is scheduled to have a biopsy tomorrow on (head?? Pt states arthritis in the head)

## 2015-02-01 ENCOUNTER — Ambulatory Visit (HOSPITAL_COMMUNITY)
Admission: RE | Admit: 2015-02-01 | Discharge: 2015-02-01 | Disposition: A | Discharge status: Home / Self Care | Payer: Medicare Other | Source: Ambulatory Visit | Attending: Interventional Radiology | Admitting: Interventional Radiology

## 2015-02-01 ENCOUNTER — Observation Stay (HOSPITAL_COMMUNITY): Payer: Medicare Other

## 2015-02-01 DIAGNOSIS — R51 Headache: Secondary | ICD-10-CM | POA: Diagnosis not present

## 2015-02-01 DIAGNOSIS — I890 Lymphedema, not elsewhere classified: Secondary | ICD-10-CM | POA: Diagnosis not present

## 2015-02-01 DIAGNOSIS — R519 Headache, unspecified: Secondary | ICD-10-CM | POA: Insufficient documentation

## 2015-02-01 DIAGNOSIS — E1165 Type 2 diabetes mellitus with hyperglycemia: Secondary | ICD-10-CM | POA: Diagnosis not present

## 2015-02-01 LAB — CBC WITH DIFFERENTIAL/PLATELET
Basophils Absolute: 0.1 10*3/uL (ref 0.0–0.1)
Basophils Relative: 1 % (ref 0–1)
Eosinophils Absolute: 0.2 10*3/uL (ref 0.0–0.7)
Eosinophils Relative: 2 % (ref 0–5)
HCT: 38.7 % (ref 36.0–46.0)
HEMOGLOBIN: 12.1 g/dL (ref 12.0–15.0)
LYMPHS ABS: 2 10*3/uL (ref 0.7–4.0)
LYMPHS PCT: 23 % (ref 12–46)
MCH: 27 pg (ref 26.0–34.0)
MCHC: 31.3 g/dL (ref 30.0–36.0)
MCV: 86.4 fL (ref 78.0–100.0)
Monocytes Absolute: 0.8 10*3/uL (ref 0.1–1.0)
Monocytes Relative: 9 % (ref 3–12)
Neutro Abs: 5.8 10*3/uL (ref 1.7–7.7)
Neutrophils Relative %: 65 % (ref 43–77)
Platelets: 313 10*3/uL (ref 150–400)
RBC: 4.48 MIL/uL (ref 3.87–5.11)
RDW: 15.1 % (ref 11.5–15.5)
WBC: 8.8 10*3/uL (ref 4.0–10.5)

## 2015-02-01 LAB — COMPREHENSIVE METABOLIC PANEL
ALT: 10 U/L — ABNORMAL LOW (ref 14–54)
AST: 16 U/L (ref 15–41)
Albumin: 2.8 g/dL — ABNORMAL LOW (ref 3.5–5.0)
Alkaline Phosphatase: 158 U/L — ABNORMAL HIGH (ref 38–126)
Anion gap: 9 (ref 5–15)
BILIRUBIN TOTAL: 0.6 mg/dL (ref 0.3–1.2)
BUN: 7 mg/dL (ref 6–20)
CHLORIDE: 103 mmol/L (ref 101–111)
CO2: 29 mmol/L (ref 22–32)
Calcium: 9 mg/dL (ref 8.9–10.3)
Creatinine, Ser: 0.57 mg/dL (ref 0.44–1.00)
GFR calc Af Amer: >60 mL/min (ref >60)
Glucose, Bld: 135 mg/dL — ABNORMAL HIGH (ref 65–99)
POTASSIUM: 3.4 mmol/L — AB (ref 3.5–5.1)
Sodium: 141 mmol/L (ref 135–145)
TOTAL PROTEIN: 5.9 g/dL — AB (ref 6.5–8.1)

## 2015-02-01 LAB — PROTIME-INR
INR: 1.16 (ref 0.00–1.49)
PROTHROMBIN TIME: 15 s (ref 11.6–15.2)

## 2015-02-01 MED ORDER — POTASSIUM CHLORIDE CRYS ER 20 MEQ PO TBCR
40.0000 meq | EXTENDED_RELEASE_TABLET | Freq: Once | ORAL | Status: AC
  Administered 2015-02-01: 40 meq via ORAL
  Filled 2015-02-01: qty 2

## 2015-02-01 MED ORDER — ADULT MULTIVITAMIN W/MINERALS CH
1.0000 | ORAL_TABLET | Freq: Every day | ORAL | Status: DC
  Administered 2015-02-01: 1 via ORAL
  Filled 2015-02-01: qty 1

## 2015-02-01 MED ORDER — HYDRALAZINE HCL 20 MG/ML IJ SOLN: 10.0000 mg | INTRAMUSCULAR | Status: DC | PRN

## 2015-02-01 MED ORDER — TRAMADOL HCL 50 MG PO TABS
50.0000 mg | ORAL_TABLET | Freq: Once | ORAL | Status: AC
Start: 2015-02-01 — End: 2015-02-01
  Administered 2015-02-01: 50 mg via ORAL

## 2015-02-01 MED ORDER — BOOST / RESOURCE BREEZE PO LIQD: 1.0000 | Freq: Three times a day (TID) | ORAL | Status: DC

## 2015-02-01 MED ORDER — TRAMADOL HCL 50 MG PO TABS: 50.0000 mg | ORAL_TABLET | Freq: Four times a day (QID) | ORAL | Status: AC | PRN

## 2015-02-01 MED ORDER — ASPIRIN 81 MG PO TBEC: 81.0000 mg | DELAYED_RELEASE_TABLET | Freq: Every day | ORAL | Status: AC

## 2015-02-01 MED ORDER — ASPIRIN EC 81 MG PO TBEC
81.0000 mg | DELAYED_RELEASE_TABLET | Freq: Every day | ORAL | Status: DC
  Filled 2015-02-01: qty 1

## 2015-02-01 MED ORDER — LORAZEPAM 2 MG/ML IJ SOLN
0.5000 mg | Freq: Once | INTRAMUSCULAR | Status: AC
  Administered 2015-02-01: 0.5 mg via INTRAVENOUS
  Filled 2015-02-01: qty 1

## 2015-02-01 NOTE — Discharge Summary (Signed)
Physician Discharge Summary  Karen Waters EXH:[id number] DOB: 09-Mar-1951 DOA: 01/31/2015  PCP: Alvester Chou, NP  Admit date: 01/31/2015 Discharge date: 02/01/2015  Time spent: 65 minutes  Recommendations for Outpatient Follow-up:  1. Follow-up with Dr. Harlow Asa, general surgery on 02/02/2015 for evaluation for temporal artery biopsy. 2. Follow-up with Dr. Jaynee Eagles of neurology as previously scheduled.  Discharge Diagnoses:  Principal Problem:   Headache Active Problems:   CAD, multiple vessel    Lymphedema   Diabetes type 2, uncontrolled   Acute encephalopathy   Gait instability   Back pain   Cephalalgia   Discharge Condition: Stable and improved.  Diet recommendation: Regular.  Filed Weights   01/31/15 1537 02/01/15 0020 02/01/15 0604  Weight: 92.534 kg (204 lb) 93.804 kg (206 lb 12.8 oz) 91.173 kg (201 lb)    History of present illness:  Per Dr Posey Pronto Karen Waters is a 64 y.o. female with Past medical history of chronic headache, chronic DVT, GERD, hypertension, COPD, multiple vertebral fractures, chronic lymphedema, chronic gait instability. The patient presented with complaints of headache. Patient has chronic headache and was diagnosed with temporal arteritis was recommended to be on prednisone as per documentation that she refused. She told admitting physician, that she had refused because it causes her osteoporosis and compression fractures. She was recently admitted for confusion and was recommended to get MRI of the brain which she refused as she was unable to go through the procedure without an open MRI. She was recommended for temporal artery biopsy which she also refused. She had multiple falls due to gait instability and actually has a home health nurse that comes every day other than Sunday. Since last 2 days prior to admission, she has been complaining of more headache located on both side of the head without any vision changes. Since last one week she has been more  confused than her baseline. Since last 3 days she has been more agitated and they have been using as needed Ativan to control her agitation. Today she was more agitated and therefore was brought to the hospital. She complained that her right hip has been hurting more lately and she was diagnosed with UTI and is supposed to be on Keflex.  The patient is coming from home.  At her baseline ambulates without any support And is independent for most of her ADL manages her medication on her own.  Hospital Course:  #1 headache Patient had presented with generalized weakness and headache. Patient's symptoms have been consistent with temporal arteritis. Patient denied any visual changes. Patient per admitting physician had refused prednisone in the past multiple times due to osteoporosis however patient stated to me that had been on prednisone for several months and was recently discontinued by her neurologist secondary to osteoporosis. Initially it was felt that patient also scheduled for temporary artery biopsy however she was actually scheduled for kyphoplasty that that has been postponed secondary to head need for temporal arteritis biopsy. Patient was admitted placed on Ultram which helped control her headaches better per patient. MRI MRA of the head was obtained that had no acute abnormalities. Patient did not have any signs or symptoms of infection. Patient did not have any nuchal rigidity. Spoke with general surgeon on call who had recommended outpatient evaluation for temporal arteritis biopsy. Patient has been scheduled to be seen by Dr. jerking of general surgery tomorrow 02/02/2015. Patient be discharged in stable and improved condition.  #2 history of DVT status post IVC filter Patient's Coumadin has been  on hold for procedure. We'll continue to hold patient's anticoagulation for now.  #3 type 2 diabetes Heme recent hemoglobin A1c was 9.2. Patient was maintained on a sliding scale insulin  during the hospitalization.  #4 chronic lymphedema Remained stable.  #5 hypertension Patient's blood pressure remained stable during the hospitalization. Patient was placed on hydralazine as needed.   Procedures:  MRI/MRA head 02/01/2015  CT head 01/31/2015    Consultations:  None  Discharge Exam: Filed Vitals:   02/01/15 1428  BP: 131/68  Pulse: 93  Temp: 99.1 F (37.3 C)  Resp: 20    General: NAD Cardiovascular: RRR Respiratory: CTAB  Discharge Instructions   Discharge Instructions    Diet general    Complete by:  As directed      Discharge instructions    Complete by:  As directed   Follow up with Dr Harlow Asa tomorrow for evaluation for temporal artery biopsy. Follow up with Dr Jaynee Eagles as scheduled.     Increase activity slowly    Complete by:  As directed           Current Discharge Medication List    START taking these medications   Details  aspirin EC 81 MG EC tablet Take 1 tablet (81 mg total) by mouth daily.    feeding supplement (BOOST / RESOURCE BREEZE) LIQD Take 1 Container by mouth 3 (three) times daily between meals. Refills: 0    traMADol (ULTRAM) 50 MG tablet Take 1 tablet (50 mg total) by mouth every 6 (six) hours as needed for severe pain. Qty: 30 tablet, Refills: 0      CONTINUE these medications which have NOT CHANGED   Details  atorvastatin (LIPITOR) 20 MG tablet Take 20 mg by mouth daily at 6 PM.     cephALEXin (KEFLEX) 500 MG capsule Take 1 capsule (500 mg total) by mouth 4 (four) times daily. Qty: 28 capsule, Refills: 0    DEXILANT 60 MG capsule Take 60 mg by mouth daily.     LORazepam (ATIVAN) 0.5 MG tablet Take 0.5 mg by mouth every 8 (eight) hours as needed for anxiety.    montelukast (SINGULAIR) 10 MG tablet Take 10 mg by mouth at bedtime.     Vitamin D, Ergocalciferol, (DRISDOL) 50000 UNITS CAPS capsule Take 1 capsule (50,000 Units total) by mouth every 7 (seven) days. Qty: 8 capsule, Refills: 0      STOP taking  these medications     traMADol-acetaminophen (ULTRACET) 37.5-325 MG per tablet        Allergies  Allergen Reactions  . Darvon [Propoxyphene] Rash and Other (See Comments)    REACTIONS: hallucinations  . Percocet [Oxycodone-Acetaminophen] Other (See Comments)    REACTION: hallucinations  . Valium [Diazepam] Other (See Comments)    Doesn't wanna take med   Follow-up Information    Follow up with Alvester Chou, NP.   Specialty:  Nurse Practitioner   Why:  As needed, If symptoms worsen   Contact information:   [address] [place] [address] [place] [address] [phone]       Follow up with Earnstine Regal, MD On 02/02/2015.   Specialty:  General Surgery   Why:  f/u at 11am for temporal artery biopsy consultation   Contact information:   [place] [address] [phone]       Follow up with Melvenia Beam, MD.   Specialty:  Neurology   Why:  f/u as shceduled  Contact information:   [address] [place] [address] [phone]        The results of significant diagnostics from this hospitalization (including imaging, microbiology, ancillary and laboratory) are listed below for reference.    Significant Diagnostic Studies: Ct Head Wo Contrast  01/31/2015   CLINICAL DATA:  Status post fall, with chronic headaches. Worsening headache, jaw pain, right hip pain and back pain. Hypertension. Initial encounter.  EXAM: CT HEAD WITHOUT CONTRAST  TECHNIQUE: Contiguous axial images were obtained from the base of the skull through the vertex without intravenous contrast.  COMPARISON:  CT of the head performed 01/10/2015  FINDINGS: There is no evidence of acute infarction, mass lesion, or intra- or extra-axial hemorrhage on CT.  Prominence of the ventricles and sulci reflects mild cortical volume loss. Scattered periventricular and subcortical white matter change likely reflects small vessel ischemic microangiopathy.  The  brainstem and fourth ventricle are within normal limits. The basal ganglia are unremarkable in appearance. The cerebral hemispheres demonstrate grossly normal gray-white differentiation. No mass effect or midline shift is seen.  There is no evidence of fracture; visualized osseous structures are unremarkable in appearance. The orbits are within normal limits. The paranasal sinuses and mastoid air cells are well-aerated. No significant soft tissue abnormalities are seen.  IMPRESSION: 1. No evidence of traumatic intracranial injury or fracture. 2. Mild cortical volume loss and scattered small vessel ischemic microangiopathy.   Electronically Signed   By: Garald Balding M.D.   On: 01/31/2015 17:37   Ct Head Wo Contrast  01/10/2015   CLINICAL DATA:  Altered mental status  EXAM: CT HEAD WITHOUT CONTRAST  TECHNIQUE: Contiguous axial images were obtained from the base of the skull through the vertex without intravenous contrast.  COMPARISON:  September 10, 2014  FINDINGS: Mild diffuse atrophy is stable. There is no intracranial mass, hemorrhage, extra-axial fluid collection, or midline shift. There is patchy small vessel disease throughout the centra semiovale bilaterally. Elsewhere gray-white compartments are normal. No acute infarct is apparent. The bony calvarium appears intact. There is mild chronic thickening of several posterior inferior mastoid air cells bilaterally. Remainder the mastoid air cells bilaterally are clear in stable.  IMPRESSION: Stable atrophy with periventricular small vessel disease. Mild chronic mastoid thickening inferiorly on both sides. No intracranial mass, hemorrhage, or acute appearing infarct. No new mastoid disease.   Electronically Signed   By: Lowella Grip III M.D.   On: 01/10/2015 15:20   Mr Jodene Nam Head Wo Contrast  02/01/2015   CLINICAL DATA:  Headache.  History of temporal arteritis.  EXAM: MRI HEAD WITHOUT CONTRAST  MRA HEAD WITHOUT CONTRAST  TECHNIQUE: Multiplanar, multiecho pulse  sequences of the brain and surrounding structures were obtained without intravenous contrast. Angiographic images of the head were obtained using MRA technique without contrast.  COMPARISON:  06/15/2014  FINDINGS: Motion degraded study which could obscure subtle pathology. Reportedly the patient is claustrophobic and this is best obtainable exam.  MRI HEAD FINDINGS  Stable appearance of cystic tight structures in the adenoid consistent with inclusion cysts.  Calvarium and upper cervical spine: No focal marrow signal abnormality.  Orbits: Cataract resections with chronic aspherical globe morphology. No acute finding or change.  Sinuses and Mastoids: Chronic fluid or mucosal thickening in the bilateral mastoids, mild.  Brain: No acute or remote infarct, hemorrhage, hydrocephalus, or mass lesion. No evidence of large vessel occlusion. There is moderate T2 and FLAIR hyperintensity throughout the bilateral cerebral white matter, confluent around the lateral ventricles -  stable from 2015.  MRA HEAD FINDINGS  Present anterior communicating and right posterior communicating arteries. The right posterior communicating artery is large in the setting of fetal type right PCA circulation. The left vertebral artery is dominant.  There is no major branch occlusion, flow limiting stenosis, or aneurysm. No evidence of vascular malformation.  IMPRESSION: 1. No acute finding. 2. Moderate chronic small vessel disease, stable from 2015. 3. Negative intracranial MRA.   Electronically Signed   By: Monte Fantasia M.D.   On: 02/01/2015 08:06   Mr Brain Wo Contrast  02/01/2015   CLINICAL DATA:  Headache.  History of temporal arteritis.  EXAM: MRI HEAD WITHOUT CONTRAST  MRA HEAD WITHOUT CONTRAST  TECHNIQUE: Multiplanar, multiecho pulse sequences of the brain and surrounding structures were obtained without intravenous contrast. Angiographic images of the head were obtained using MRA technique without contrast.  COMPARISON:  06/15/2014   FINDINGS: Motion degraded study which could obscure subtle pathology. Reportedly the patient is claustrophobic and this is best obtainable exam.  MRI HEAD FINDINGS  Stable appearance of cystic tight structures in the adenoid consistent with inclusion cysts.  Calvarium and upper cervical spine: No focal marrow signal abnormality.  Orbits: Cataract resections with chronic aspherical globe morphology. No acute finding or change.  Sinuses and Mastoids: Chronic fluid or mucosal thickening in the bilateral mastoids, mild.  Brain: No acute or remote infarct, hemorrhage, hydrocephalus, or mass lesion. No evidence of large vessel occlusion. There is moderate T2 and FLAIR hyperintensity throughout the bilateral cerebral white matter, confluent around the lateral ventricles - stable from 2015.  MRA HEAD FINDINGS  Present anterior communicating and right posterior communicating arteries. The right posterior communicating artery is large in the setting of fetal type right PCA circulation. The left vertebral artery is dominant.  There is no major branch occlusion, flow limiting stenosis, or aneurysm. No evidence of vascular malformation.  IMPRESSION: 1. No acute finding. 2. Moderate chronic small vessel disease, stable from 2015. 3. Negative intracranial MRA.   Electronically Signed   By: Monte Fantasia M.D.   On: 02/01/2015 08:06   Ct Abdomen Pelvis W Contrast  01/08/2015   CLINICAL DATA:  Generalized abdominal pain and constipation.  EXAM: CT ABDOMEN AND PELVIS WITH CONTRAST  TECHNIQUE: Multidetector CT imaging of the abdomen and pelvis was performed using the standard protocol following bolus administration of intravenous contrast.  CONTRAST:  190mL OMNIPAQUE IOHEXOL 300 MG/ML SOLN, 29mL OMNIPAQUE IOHEXOL 300 MG/ML SOLN  COMPARISON:  12/04/2014  FINDINGS: Lower chest: The lung bases are clear of acute process. Minimal scarring and/or atelectasis. The heart is normal in size. No pericardial effusion. Coronary artery  calcifications are noted. The distal esophagus is grossly normal.  Hepatobiliary: No focal hepatic lesions or intrahepatic biliary dilatation. The gallbladder is normal. No common bile duct dilatation.  Pancreas: Moderate atrophy but no mass or inflammation.  Spleen: Normal size.  No focal lesions.  Adrenals/Urinary Tract: Stable bilateral adrenal gland adenomas.  Bilateral renal calculi but no obstructing ureteral calculi or bladder calculi. Both kidneys demonstrate normal enhancement/ perfusion. No worrisome renal lesions. The delayed images do not demonstrate any significant collecting system abnormality.  Stomach/Bowel: The stomach, duodenum, small bowel and colon are unremarkable. No inflammatory changes, mass lesions or obstructive findings.  Vascular/Lymphatic: Advanced atherosclerotic calcifications involving the aorta and branch vessels but no focal aneurysm. The major venous structures are patent. Stable IVC filter.  Other: The uterus is surgically absent. There is a mild cystocele noted. No pelvic mass  or adenopathy. No free pelvic fluid collections. No inguinal mass or adenopathy.  Musculoskeletal: Numerous thoracic and lumbar compression fractures. T8, T11 and L1 are chronic. The since the prior study there is a new compression fracture of L3 and further compression of L4.  IMPRESSION: 1. No acute abdominal/pelvic findings, mass lesions or adenopathy. 2. New compression fracture of L3 a since the prior CT scan and further compression of L4. Remote fractures of T8, T11 and L1. 3. Bilateral renal calculi but no obstructing ureteral calculi or bladder calculi. 4. Stable bilateral adrenal gland adenomas. 5. Stable advanced atherosclerotic calcifications involving the aorta and branch vessels. 6. Mild cystocele. 7. Stable IVC filter.   Electronically Signed   By: Marijo Sanes M.D.   On: 01/08/2015 18:16   Ct Hip Right Wo Contrast  01/26/2015   CLINICAL DATA:  Fall.  RIGHT hip pain.  Bilateral leg edema.   EXAM: CT OF THE RIGHT HIP WITHOUT CONTRAST  TECHNIQUE: Multidetector CT imaging of the right hip was performed according to the standard protocol. Multiplanar CT image reconstructions were also generated.  COMPARISON:  None.  FINDINGS: Proximal RIGHT femur is intact. RIGHT hip joint space appears preserved. No marginal osteophytes at the RIGHT hip. RIGHT obturator ring appears normal. Lumbar spondylosis is partially visible. RIGHT sacroiliac joint osteoarthritis. Chondrocalcinosis at the pubic symphysis. Atherosclerosis. Hysterectomy incidentally noted.  If there is high clinical suspicion for occult hip fracture or the patient refuses to weightbear, consider further evaluation with MRI. Although CT is expeditious, evidence is lacking regarding accuracy of CT over plain film radiography.  IMPRESSION: No displaced RIGHT hip fracture.   Electronically Signed   By: Dereck Ligas M.D.   On: 01/26/2015 16:03   US Abdomen Limited  01/09/2015   CLINICAL DATA:  Right upper quadrant abdominal pain. Hypertension. Diabetes.  EXAM: US ABDOMEN LIMITED - RIGHT UPPER QUADRANT  COMPARISON:  CT scan of 01/08/2015  FINDINGS: Gallbladder:  The small amount of sludge observed dependently in the gallbladder. No definite gallstone. No gallbladder wall thickening. Sonographic Murphy's sign absent.  Common bile duct:  Diameter: 4 mm, within normal limits.  Liver:  No focal lesion identified. Within normal limits in parenchymal echogenicity. Reduced sensitivity due to difficulty with sonic windows.  On focused review of recent CT scans, it is my impression that the patient has a healing lower sternal fracture.  IMPRESSION: 1. Small amount of sludge in the gallbladder, without gallbladder wall thickening, pericholecystic fluid, or sonographic Murphy's sign. 2. Serial review of recent CT scans makes me suspect that the patient probably has a healing lower sternal fracture, which could possibly be contributing to pain in this vicinity. 3.  Characterization of the liver is slightly limited due to poor sonic windows, but on yesterday's CT scan no specific hepatic abnormality was observed.   Electronically Signed   By: Van Clines M.D.   On: 01/09/2015 17:21   Dg Hip Unilat  With Pelvis 2-3 Views Right  01/26/2015   CLINICAL DATA:  Acute right hip pain after fall this morning. Initial encounter.  EXAM: DG HIP (WITH OR WITHOUT PELVIS) 2-3V RIGHT  COMPARISON:  None.  FINDINGS: There is no evidence of hip fracture or dislocation. There is no evidence of arthropathy or other focal bone abnormality.  IMPRESSION: Normal right hip.   Electronically Signed   By: Marijo Conception, M.D.   On: 01/26/2015 12:51    Microbiology: Recent Results (from the past 240 hour(s))  Urine culture  Status: None   Collection Time: 01/26/15  3:30 PM  Result Value Ref Range Status   Specimen Description URINE, CLEAN CATCH  Final   Special Requests NONE  Final   Culture MULTIPLE SPECIES PRESENT, SUGGEST RECOLLECTION  Final   Report Status 01/28/2015 FINAL  Final     Labs: Basic Metabolic Panel:  Recent Labs Lab 01/26/15 1429 01/31/15 1845 02/01/15 0620  NA 139 140 141  K 4.2 2.9* 3.4*  CL 104 105 103  CO2 27 26 29   GLUCOSE 132* 184* 135*  BUN 11 9 7   CREATININE 0.69 0.62 0.57  CALCIUM 9.2 8.8* 9.0   Liver Function Tests:  Recent Labs Lab 02/01/15 0620  AST 16  ALT 10*  ALKPHOS 158*  BILITOT 0.6  PROT 5.9*  ALBUMIN 2.8*   No results for input(s): LIPASE, AMYLASE in the last 168 hours. No results for input(s): AMMONIA in the last 168 hours. CBC:  Recent Labs Lab 01/26/15 1429 01/31/15 1845 02/01/15 0620  WBC 10.9* 8.7 8.8  NEUTROABS 8.6* 5.9 5.8  HGB 12.4 11.7* 12.1  HCT 38.6 37.2 38.7  MCV 86.9 87.7 86.4  PLT 282 299 313   Cardiac Enzymes: No results for input(s): CKTOTAL, CKMB, CKMBINDEX, TROPONINI in the last 168 hours. BNP: BNP (last 3 results) No results for input(s): BNP in the last 8760 hours.  ProBNP  (last 3 results)  Recent Labs  06/12/14 1906  PROBNP 235.8*    CBG: No results for input(s): GLUCAP in the last 168 hours.     SignedIrine Seal MD [facility] 02/01/2015, 5:03 PM

## 2015-02-01 NOTE — Progress Notes (Signed)
Initial Nutrition Assessment  DOCUMENTATION CODES:   Obesity unspecified  INTERVENTION:   Boost Breeze po TID, each supplement provides 250 kcal and 9 grams of protein  MVI daily  NUTRITION DIAGNOSIS:   Increased nutrient needs related to wound healing as evidenced by estimated needs.  GOAL:   Patient will meet greater than or equal to 90% of their needs  MONITOR:   PO intake, Weight trends, Supplement acceptance, Labs, Skin, I & O's  REASON FOR ASSESSMENT:   Malnutrition Screening Tool    ASSESSMENT:   Karen Waters is a 64 y.o. female with Past medical history of chronic headache, chronic DVT, GERD, hypertension, COPD, multiple vertebral fractures, chronic lymphedema, chronic gait instability.  Pt admitted with headache.   Attempted to visit pt x 2, however, pt was using bathroom at times of visit.   Appetite is variable; noted 15-85% meal completion (trending up. Per MAR, pt is refusing Ensure supplements.   Per wt hx, UBW 210#. No significant wt changes noted.   Pt with chronic stage II wound on buttock; RD will add supplement to support wound healing.   Per MD notes, plan for MRI of brain.  Labs reviewed: K: 3.4.  Diet Order:  Diet regular Room service appropriate?: Yes; Fluid consistency:: Thin  Skin:  Wound (see comment) (stage II buttocks, skin tear upper arm)  Last BM:  01/31/15  Height:   Ht Readings from Last 1 Encounters:  02/01/15 5\' 4"  (1.626 m)    Weight:   Wt Readings from Last 1 Encounters:  02/01/15 201 lb (91.173 kg)    Ideal Body Weight:  54.2 kg  BMI:  Body mass index is 34.48 kg/(m^2).  Estimated Nutritional Needs:   Kcal:  1900-2100  Protein:  85-100 grams  Fluid:  1.9-2.1 L  EDUCATION NEEDS:   No education needs identified at this time  [facility] Touch A. Jimmye Norman, RD, LDN, CDE Pager: [phone] After hours Pager: [phone]

## 2015-02-01 NOTE — Progress Notes (Signed)
Patient received to room 5W12 from ED, report received from [facility], transferred from stretcher to bed, patient incontinent, bathed, new gown on. Noted skin tear to right arm from fall at home, cleansed and nonadaptic pad, kerlix applied, noted bottom to be red but blanchable. Explained need to turn every 2 hours, patient refuses at this time. Noted lower ext to be discolored but no open areas. O2 in use, IV to RAC flushes without problems, site clear. Assessment complete, patient oriented to room, call bell, bed controls, patient guide, fall prevention protocol and orders that I have at this time with verbal understanding. SR up, call bell in reach, bed alarm on and working properly. Will monitor.

## 2015-02-02 ENCOUNTER — Ambulatory Visit: Payer: Self-pay | Admitting: Surgery

## 2015-02-02 ENCOUNTER — Telehealth: Payer: Self-pay | Admitting: Neurology

## 2015-02-02 NOTE — Telephone Encounter (Signed)
Left VM to let Karen Waters know I wanted to send referral to [facility] and not [facility]. Told her to disregard referral and to call back with any questions. Gave [facility] phone number.

## 2015-02-02 NOTE — Telephone Encounter (Signed)
Kim with [facility] in [place] is calling regarding the patient.  Kim received a referral for the patient and wanted to be sure it was sent to the right office.  Please call and advise. Thank you.

## 2015-02-03 ENCOUNTER — Telehealth: Payer: Self-pay | Admitting: Neurology

## 2015-02-03 ENCOUNTER — Encounter (HOSPITAL_COMMUNITY): Payer: Self-pay | Admitting: *Deleted

## 2015-02-03 NOTE — Telephone Encounter (Signed)
Pt called and wanted to let Dr. Jaynee Eagles know she will be having surgery Aug. 9th at 7:30. May call pt at [phone]

## 2015-02-03 NOTE — Telephone Encounter (Signed)
Returning pt call. Left VM for her to call back. If she calls back, please let her know Dr. Jaynee Eagles is out of the office until Monday and I will give her the message. Thank you!

## 2015-02-03 NOTE — Telephone Encounter (Signed)
Spoke w/ Rip Harbour and she stated she already saw that pt was scheduled for surgery. She stated referral would need to be placed to them specifically at [facility] in [place] if we would like her to go there. I told her I will check w. Dr Jaynee Eagles when she gets back in the office but to disregard referral for right now. She verbalized understanding.

## 2015-02-03 NOTE — Telephone Encounter (Signed)
Melinda [facility] called to speak with Terrence Dupont about a faxed referral. She did not know why the pt was being referred. Please call Rip Harbour back at [phone]. Thank you

## 2015-02-03 NOTE — Telephone Encounter (Signed)
Returning [place] phone call. Told her to call me back at [phone].

## 2015-02-06 NOTE — Telephone Encounter (Signed)
We just need a temporal artery biopsy, don't care where as long as she gets it. thanks

## 2015-02-07 NOTE — Anesthesia Preprocedure Evaluation (Addendum)
Anesthesia Evaluation  Patient identified by MRN, date of birth, ID band Patient awake    Reviewed: Allergy & Precautions, NPO status , Patient's Chart, lab work & pertinent test results  History of Anesthesia Complications (+) Family history of anesthesia reaction and history of anesthetic complications (reports brother and father are aggressive after anesthesia)  Airway Mallampati: II  TM Distance: >3 FB Neck ROM: Full    Dental no notable dental hx. (+) Dental Advisory Given   Pulmonary COPDformer smoker,  breath sounds clear to auscultation  Pulmonary exam normal       Cardiovascular hypertension, + CAD Normal cardiovascular examRhythm:Regular Rate:Normal     Neuro/Psych  Headaches, PSYCHIATRIC DISORDERS Anxiety    GI/Hepatic Neg liver ROS, GERD-  ,  Endo/Other  diabetes  Renal/GU negative Renal ROS  negative genitourinary   Musculoskeletal  (+) Arthritis -, Osteoarthritis,    Abdominal   Peds negative pediatric ROS (+)  Hematology negative hematology ROS (+)   Anesthesia Other Findings   Reproductive/Obstetrics negative OB ROS                            Anesthesia Physical Anesthesia Plan  ASA: III  Anesthesia Plan: General   Post-op Pain Management:    Induction: Intravenous  Airway Management Planned: LMA  Additional Equipment:   Intra-op Plan:   Post-operative Plan: Extubation in OR  Informed Consent: I have reviewed the patients History and Physical, chart, labs and discussed the procedure including the risks, benefits and alternatives for the proposed anesthesia with the patient or authorized representative who has indicated his/her understanding and acceptance.   Dental advisory given  Plan Discussed with: CRNA  Anesthesia Plan Comments:        Anesthesia Quick Evaluation

## 2015-02-08 ENCOUNTER — Ambulatory Visit (HOSPITAL_COMMUNITY): Payer: Medicare Other | Admitting: Anesthesiology

## 2015-02-08 ENCOUNTER — Encounter (HOSPITAL_COMMUNITY): Payer: Self-pay

## 2015-02-08 ENCOUNTER — Encounter (HOSPITAL_COMMUNITY)
Admission: RE | Disposition: A | Discharge status: Home / Self Care | Payer: Self-pay | Source: Ambulatory Visit | Attending: Surgery

## 2015-02-08 ENCOUNTER — Ambulatory Visit (HOSPITAL_COMMUNITY)
Admission: RE | Admit: 2015-02-08 | Discharge: 2015-02-08 | Disposition: A | Discharge status: Home / Self Care | Payer: Medicare Other | Source: Ambulatory Visit | Attending: Surgery | Admitting: Surgery

## 2015-02-08 DIAGNOSIS — Z86711 Personal history of pulmonary embolism: Secondary | ICD-10-CM | POA: Insufficient documentation

## 2015-02-08 DIAGNOSIS — F419 Anxiety disorder, unspecified: Secondary | ICD-10-CM | POA: Insufficient documentation

## 2015-02-08 DIAGNOSIS — K219 Gastro-esophageal reflux disease without esophagitis: Secondary | ICD-10-CM | POA: Diagnosis not present

## 2015-02-08 DIAGNOSIS — I2510 Atherosclerotic heart disease of native coronary artery without angina pectoris: Secondary | ICD-10-CM | POA: Insufficient documentation

## 2015-02-08 DIAGNOSIS — Z7982 Long term (current) use of aspirin: Secondary | ICD-10-CM | POA: Diagnosis not present

## 2015-02-08 DIAGNOSIS — M1990 Unspecified osteoarthritis, unspecified site: Secondary | ICD-10-CM | POA: Diagnosis not present

## 2015-02-08 DIAGNOSIS — J449 Chronic obstructive pulmonary disease, unspecified: Secondary | ICD-10-CM | POA: Diagnosis not present

## 2015-02-08 DIAGNOSIS — E119 Type 2 diabetes mellitus without complications: Secondary | ICD-10-CM | POA: Insufficient documentation

## 2015-02-08 DIAGNOSIS — Z792 Long term (current) use of antibiotics: Secondary | ICD-10-CM | POA: Insufficient documentation

## 2015-02-08 DIAGNOSIS — R519 Headache, unspecified: Secondary | ICD-10-CM | POA: Diagnosis present

## 2015-02-08 DIAGNOSIS — F17200 Nicotine dependence, unspecified, uncomplicated: Secondary | ICD-10-CM | POA: Insufficient documentation

## 2015-02-08 DIAGNOSIS — I10 Essential (primary) hypertension: Secondary | ICD-10-CM | POA: Diagnosis not present

## 2015-02-08 DIAGNOSIS — E780 Pure hypercholesterolemia: Secondary | ICD-10-CM | POA: Insufficient documentation

## 2015-02-08 DIAGNOSIS — R51 Headache: Secondary | ICD-10-CM | POA: Diagnosis present

## 2015-02-08 DIAGNOSIS — Z79899 Other long term (current) drug therapy: Secondary | ICD-10-CM | POA: Insufficient documentation

## 2015-02-08 HISTORY — PX: ARTERY BIOPSY: SHX891

## 2015-02-08 SURGERY — BIOPSY TEMPORAL ARTERY
Anesthesia: General | Laterality: Left

## 2015-02-08 MED ORDER — PHENYLEPHRINE HCL 10 MG/ML IJ SOLN
INTRAMUSCULAR | Status: DC | PRN
  Administered 2015-02-08 (×3): 80 ug via INTRAVENOUS

## 2015-02-08 MED ORDER — HYDROCODONE-ACETAMINOPHEN 5-325 MG PO TABS: 1.0000 | ORAL_TABLET | ORAL | Status: AC | PRN

## 2015-02-08 MED ORDER — TRAMADOL-ACETAMINOPHEN 37.5-325 MG PO TABS
1.0000 | ORAL_TABLET | ORAL | Status: DC | PRN
  Administered 2015-02-08: 1 via ORAL
  Filled 2015-02-08: qty 1

## 2015-02-08 MED ORDER — LIDOCAINE HCL (CARDIAC) 20 MG/ML IV SOLN
INTRAVENOUS | Status: DC | PRN
  Administered 2015-02-08: 50 mg via INTRAVENOUS

## 2015-02-08 MED ORDER — MIDAZOLAM HCL 5 MG/5ML IJ SOLN
INTRAMUSCULAR | Status: DC | PRN
  Administered 2015-02-08: 1 mg via INTRAVENOUS

## 2015-02-08 MED ORDER — PROPOFOL 10 MG/ML IV BOLUS
INTRAVENOUS | Status: AC
  Filled 2015-02-08: qty 20

## 2015-02-08 MED ORDER — CEFAZOLIN SODIUM-DEXTROSE 2-3 GM-% IV SOLR
INTRAVENOUS | Status: AC
  Filled 2015-02-08: qty 50

## 2015-02-08 MED ORDER — ONDANSETRON HCL 4 MG/2ML IJ SOLN
INTRAMUSCULAR | Status: AC
  Filled 2015-02-08: qty 2

## 2015-02-08 MED ORDER — SODIUM CHLORIDE 0.9 % IJ SOLN
INTRAMUSCULAR | Status: AC
  Filled 2015-02-08: qty 40

## 2015-02-08 MED ORDER — ONDANSETRON HCL 4 MG/2ML IJ SOLN: 4.0000 mg | Freq: Once | INTRAMUSCULAR | Status: DC | PRN

## 2015-02-08 MED ORDER — LACTATED RINGERS IV SOLN
INTRAVENOUS | Status: DC | PRN
  Administered 2015-02-08: 07:00:00 via INTRAVENOUS
  Administered 2015-02-08: 1000 mL

## 2015-02-08 MED ORDER — GLYCOPYRROLATE 0.2 MG/ML IJ SOLN
INTRAMUSCULAR | Status: AC
  Filled 2015-02-08: qty 2

## 2015-02-08 MED ORDER — LIDOCAINE HCL (CARDIAC) 20 MG/ML IV SOLN
INTRAVENOUS | Status: AC
  Filled 2015-02-08: qty 5

## 2015-02-08 MED ORDER — PROPOFOL 10 MG/ML IV BOLUS
INTRAVENOUS | Status: DC | PRN
  Administered 2015-02-08: 190 mg via INTRAVENOUS

## 2015-02-08 MED ORDER — CEFAZOLIN SODIUM-DEXTROSE 2-3 GM-% IV SOLR
2.0000 g | INTRAVENOUS | Status: AC
  Administered 2015-02-08: 2 g via INTRAVENOUS

## 2015-02-08 MED ORDER — FENTANYL CITRATE (PF) 100 MCG/2ML IJ SOLN
INTRAMUSCULAR | Status: AC
  Filled 2015-02-08: qty 2

## 2015-02-08 MED ORDER — BUPIVACAINE HCL (PF) 0.5 % IJ SOLN
INTRAMUSCULAR | Status: DC | PRN
  Administered 2015-02-08: 6 mL

## 2015-02-08 MED ORDER — PHENYLEPHRINE 40 MCG/ML (10ML) SYRINGE FOR IV PUSH (FOR BLOOD PRESSURE SUPPORT)
PREFILLED_SYRINGE | INTRAVENOUS | Status: AC
  Filled 2015-02-08: qty 10

## 2015-02-08 MED ORDER — FENTANYL CITRATE (PF) 100 MCG/2ML IJ SOLN
INTRAMUSCULAR | Status: DC | PRN
  Administered 2015-02-08 (×4): 50 ug via INTRAVENOUS

## 2015-02-08 MED ORDER — ARTIFICIAL TEARS OP OINT
TOPICAL_OINTMENT | OPHTHALMIC | Status: AC
  Filled 2015-02-08: qty 3.5

## 2015-02-08 MED ORDER — EPHEDRINE SULFATE 50 MG/ML IJ SOLN
INTRAMUSCULAR | Status: AC
  Filled 2015-02-08: qty 3

## 2015-02-08 MED ORDER — ROCURONIUM BROMIDE 100 MG/10ML IV SOLN
INTRAVENOUS | Status: AC
  Filled 2015-02-08: qty 1

## 2015-02-08 MED ORDER — MIDAZOLAM HCL 2 MG/2ML IJ SOLN
INTRAMUSCULAR | Status: AC
  Filled 2015-02-08: qty 4

## 2015-02-08 MED ORDER — BUPIVACAINE HCL (PF) 0.5 % IJ SOLN
INTRAMUSCULAR | Status: AC
  Filled 2015-02-08: qty 30

## 2015-02-08 MED ORDER — FENTANYL CITRATE (PF) 250 MCG/5ML IJ SOLN
INTRAMUSCULAR | Status: AC
  Filled 2015-02-08: qty 25

## 2015-02-08 MED ORDER — FENTANYL CITRATE (PF) 100 MCG/2ML IJ SOLN
25.0000 ug | INTRAMUSCULAR | Status: DC | PRN
  Administered 2015-02-08 (×2): 25 ug via INTRAVENOUS

## 2015-02-08 MED ORDER — ONDANSETRON HCL 4 MG/2ML IJ SOLN
INTRAMUSCULAR | Status: DC | PRN
  Administered 2015-02-08: 4 mg via INTRAVENOUS

## 2015-02-08 MED ORDER — NEOSTIGMINE METHYLSULFATE 10 MG/10ML IV SOLN
INTRAVENOUS | Status: AC
  Filled 2015-02-08: qty 1

## 2015-02-08 SURGICAL SUPPLY — 43 items
ATTRACTOMAT 16X20 MAGNETIC DRP (DRAPES) ×3 IMPLANT
BENZOIN TINCTURE PRP APPL 2/3 (GAUZE/BANDAGES/DRESSINGS) ×3 IMPLANT
BLADE HEX COATED 2.75 (ELECTRODE) ×3 IMPLANT
BLADE SURG 15 STRL LF DISP TIS (BLADE) ×2 IMPLANT
BLADE SURG 15 STRL SS (BLADE) ×4
BLADE SURG SZ10 CARB STEEL (BLADE) ×6 IMPLANT
CLIP TI WIDE RED SMALL 6 (CLIP) ×18 IMPLANT
CLOSURE WOUND 1/2 X4 (GAUZE/BANDAGES/DRESSINGS) ×1
COVER SURGICAL LIGHT HANDLE (MISCELLANEOUS) ×3 IMPLANT
DISSECTOR ROUND CHERRY 3/8 STR (MISCELLANEOUS) ×6 IMPLANT
DRAPE LAPAROTOMY T 98X78 PEDS (DRAPES) ×3 IMPLANT
ELECT NEEDLE TIP 2.8 STRL (NEEDLE) ×3 IMPLANT
ELECT PENCIL ROCKER SW 15FT (MISCELLANEOUS) ×3 IMPLANT
ELECT REM PT RETURN 9FT ADLT (ELECTROSURGICAL) ×3
ELECTRODE REM PT RTRN 9FT ADLT (ELECTROSURGICAL) ×1 IMPLANT
GAUZE SPONGE 4X4 12PLY STRL (GAUZE/BANDAGES/DRESSINGS) ×3 IMPLANT
GAUZE SPONGE 4X4 16PLY XRAY LF (GAUZE/BANDAGES/DRESSINGS) ×6 IMPLANT
GLOVE BIOGEL PI IND STRL 7.0 (GLOVE) ×1 IMPLANT
GLOVE BIOGEL PI INDICATOR 7.0 (GLOVE) ×2
GLOVE ECLIPSE 8.0 STRL XLNG CF (GLOVE) ×3 IMPLANT
GLOVE INDICATOR 8.0 STRL GRN (GLOVE) ×6 IMPLANT
GOWN STRL REUS W/TWL LRG LVL3 (GOWN DISPOSABLE) ×3 IMPLANT
GOWN STRL REUS W/TWL XL LVL3 (GOWN DISPOSABLE) ×6 IMPLANT
HEMOSTAT SURGICEL 2X14 (HEMOSTASIS) ×3 IMPLANT
KIT BASIN OR (CUSTOM PROCEDURE TRAY) ×3 IMPLANT
LIQUID BAND (GAUZE/BANDAGES/DRESSINGS) ×3 IMPLANT
NS IRRIG 1000ML POUR BTL (IV SOLUTION) ×3 IMPLANT
PACK BASIC VI WITH GOWN DISP (CUSTOM PROCEDURE TRAY) ×3 IMPLANT
PEN SKIN MARKING BROAD (MISCELLANEOUS) ×3 IMPLANT
STAPLER VISISTAT 35W (STAPLE) ×3 IMPLANT
STRIP CLOSURE SKIN 1/2X4 (GAUZE/BANDAGES/DRESSINGS) ×2 IMPLANT
SUT SILK 2 0 (SUTURE)
SUT SILK 2-0 18XBRD TIE 12 (SUTURE) IMPLANT
SUT SILK 3 0 (SUTURE) ×2
SUT SILK 3-0 18XBRD TIE 12 (SUTURE) ×1 IMPLANT
SUT VIC AB 3-0 SH 27 (SUTURE)
SUT VIC AB 3-0 SH 27XBRD (SUTURE) IMPLANT
SUT VIC AB 4-0 PS2 27 (SUTURE) ×6 IMPLANT
SYR BULB IRRIGATION 50ML (SYRINGE) IMPLANT
SYR CONTROL 10ML LL (SYRINGE) ×3 IMPLANT
TOWEL OR 17X26 10 PK STRL BLUE (TOWEL DISPOSABLE) ×3 IMPLANT
WATER STERILE IRR 1500ML POUR (IV SOLUTION) ×3 IMPLANT
YANKAUER SUCT BULB TIP 10FT TU (MISCELLANEOUS) ×3 IMPLANT

## 2015-02-08 NOTE — Progress Notes (Signed)
PACU note---- update to Dr. Jillyn Hidden; SAO2 89-94; BP better; pt more alert; OK to go to short stay Stage 2 for continued observation to go home

## 2015-02-08 NOTE — Op Note (Signed)
Karen Waters, Karen Waters             ACCOUNT NO.:  [id number]  MEDICAL RECORD NO.:  [id number]  LOCATION:  [facility]                         FACILITY:  [facility]  PHYSICIAN:  Earnstine Regal, MD      DATE OF BIRTH:  [birth year]  DATE OF PROCEDURE:  02/08/2015                              OPERATIVE REPORT   PREOPERATIVE DIAGNOSIS:  Facial pain, headache.  POSTOPERATIVE DIAGNOSIS:  Facial pain, headache.  PROCEDURE:  Left temporal artery biopsy.  SURGEON:  Earnstine Regal, MD, FACS  ANESTHESIA:  General.  ESTIMATED BLOOD LOSS:  Minimal.  PREPARATION:  ChloraPrep.  COMPLICATIONS:  None.  INDICATIONS:  The patient is a 64 year old female being evaluated for facial pain and headache.  She is referred by her neurologist, Dr. Sarina Ill, for temporal artery biopsy.  The patient is symptomatic on the left.  BODY OF REPORT:  Procedure was done in OR #2 at the [facility].  The patient was brought to the operating room, placed in supine position on the operating room table.  Following induction of general anesthesia, the patient was positioned and then prepped and draped in the usual aseptic fashion.  After ascertaining an adequate level of anesthesia had been achieved, a 2-cm incision was made vertically just anterior to the left ear over a palpable branch of the temporal artery.  Dissection was carried through subcutaneous tissues. Artery was identified.  It was running parallel with a moderate-sized vein.  The entire vascular bundle was dissected out for 2 cm along its length.  It was ligated proximally and distally with 3-0 silk ties and sharply excised.  The 1.5-cm segment of artery and vein was submitted together to pathology in saline for review.  Good hemostasis was noted throughout the wound.  Subcutaneous tissues were closed with interrupted 4-0 Vicryl sutures.  Skin was anesthetized with local anesthetic.  Skin edges were reapproximated with a running 4- 0  Monocryl subcuticular suture.  Wound was washed and dried.  Dermabond was placed as dressing.  The patient was awakened from anesthesia and brought to the recovery room.  The patient tolerated the procedure well.   Earnstine Regal, MD, [facility] Office: [phone]    TMG/MEDQ  D:  02/08/2015  T:  02/08/2015  Job:  [id number]  cc:   Heide Spark, MD [facility]

## 2015-02-08 NOTE — Anesthesia Procedure Notes (Signed)
Procedure Name: Intubation Date/Time: 02/08/2015 7:40 AM Performed by: Cordon Gassett, Virgel Gess Pre-anesthesia Checklist: Patient identified, Emergency Drugs available, Suction available, Patient being monitored and Timeout performed Patient Re-evaluated:Patient Re-evaluated prior to inductionOxygen Delivery Method: Circle system utilized Preoxygenation: Pre-oxygenation with 100% oxygen Intubation Type: IV induction Ventilation: Mask ventilation without difficulty Laryngoscope Size: Mac and 4 Grade View: Grade I Tube type: Oral Tube size: 7.5 mm Number of attempts: 1 Airway Equipment and Method: Stylet Placement Confirmation: ETT inserted through vocal cords under direct vision,  positive ETCO2,  CO2 detector and breath sounds checked- equal and bilateral Secured at: 22 cm Tube secured with: Tape Dental Injury: Teeth and Oropharynx as per pre-operative assessment  Comments: LMA would not seat well, too much leak.

## 2015-02-08 NOTE — Progress Notes (Signed)
Dozing at present.

## 2015-02-08 NOTE — Discharge Instructions (Signed)
PATIENT INSTRUCTIONS  POST-ANESTHESIA    IMMEDIATELY FOLLOWING SURGERY:  Do not drive or operate machinery for the first twenty four hours after surgery.  Do not make any important decisions for twenty four hours after surgery or while taking narcotic pain medications or sedatives.  If you develop intractable nausea and vomiting or a severe headache please notify your doctor immediately.    FOLLOW-UP:  Please make an appointment with your surgeon as instructed. You do not need to follow up with anesthesia unless specifically instructed to do so.

## 2015-02-08 NOTE — Anesthesia Postprocedure Evaluation (Signed)
  Anesthesia Post-op Note  Patient: Karen Waters  Procedure(s) Performed: Procedure(s) (LRB): LEFT BIOPSY TEMPORAL ARTERY (Left)  Patient Location: PACU  Anesthesia Type: General  Level of Consciousness: awake and alert   Airway and Oxygen Therapy: Patient Spontanous Breathing  Post-op Pain: mild  Post-op Assessment: Post-op Vital signs reviewed, Patient's Cardiovascular Status Stable, Respiratory Function Stable, Patent Airway and No signs of Nausea or vomiting  Last Vitals:  Filed Vitals:   02/08/15 1030  BP: 169/86  Pulse: 92  Temp: 36.3 C  Resp: 16    Post-op Vital Signs: stable   Complications: No apparent anesthesia complications. Oxygen saturations back to baseline 95% on RA and awake and alert upon exiting the PACU.

## 2015-02-08 NOTE — Transfer of Care (Signed)
Immediate Anesthesia Transfer of Care Note  Patient: Karen Waters  Procedure(s) Performed: Procedure(s): LEFT BIOPSY TEMPORAL ARTERY (Left)  Patient Location: PACU  Anesthesia Type:General  Level of Consciousness:  sedated, patient cooperative and responds to stimulation  Airway & Oxygen Therapy:Patient Spontanous Breathing and Patient connected to face mask oxgen  Post-op Assessment:  Report given to PACU RN and Post -op Vital signs reviewed and stable  Post vital signs:  Reviewed and stable  Last Vitals:  Filed Vitals:   02/08/15 0628  BP: 161/66  Pulse: 83  Temp: 36.7 C  Resp: 18    Complications: No apparent anesthesia complications

## 2015-02-08 NOTE — Progress Notes (Signed)
Asst w bedside commode. Patient vomited when returned to stretcher

## 2015-02-08 NOTE — Progress Notes (Signed)
PACU note---spoke with Dr. Jillyn Hidden, wants pt to continue with IS , coughing, and deep breathing and remain in PACU for continued observation; will continue to encourage pt with respiratory effort

## 2015-02-08 NOTE — Progress Notes (Signed)
Pt c/o difficulty breathing; SAO2 94 on nasal O2, SAO2 90 on room air; pt does not want cannula in or O2 mask; attempting to help pt with IS with poor effort; pt also refuses ventolin treatment by mask; wil continue to observe

## 2015-02-08 NOTE — Interval H&P Note (Signed)
History and Physical Interval Note:  02/08/2015 7:14 AM  Karen Waters  has presented today for surgery, with the diagnosis of left head pain and headaches. The various methods of treatment have been discussed with the patient and family. After consideration of risks, benefits and other options for treatment, the patient has consented to    Procedure(s): LEFT BIOPSY TEMPORAL ARTERY (Left) as a surgical intervention .    The patient's history has been reviewed, patient examined, no change in status, stable for surgery.  I have reviewed the patient's chart and labs.  Questions were answered to the patient's satisfaction.    Earnstine Regal, MD, [facility] Office: [place]

## 2015-02-08 NOTE — H&P (Signed)
General Surgery [facility]  Karen Waters DOB: 1951/05/31 Divorced / Language: Cleophus Molt / Race: White Female  History of Present Illness Patient words: temporal artery bx.  The patient is a 64 year old female presenting to discuss diagnostic procedure results. Patient presents to discuss temporal artery biopsy on referral from her neurologist and her medical team. Patient was seen in December 2015 by my partner but biopsy was not performed for a variety of reasons. Patient continues to have left-sided facial pain and headache. She is no longer having symptoms on the right side. She presents today to discuss left temporal artery biopsy. Patient is currently not taking anticoagulation.   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 Atorvastatin Calcium (20MG  Tablet, Oral) Active. Montelukast Sodium (10MG  Tablet, Oral) Active. Aspirin (81MG  Tablet, Oral) Active. Keflex (500MG  Capsule, Oral) Active. LORazepam (0.5MG  Tablet, Oral as needed) Active. Ultram (50MG  Tablet, Oral as needed) Active. Vitamin D (50000UNIT Capsule, Oral) Active. Medications Reconciled  Social History Caffeine use Carbonated beverages. No alcohol use No drug use Tobacco use Current every day smoker.  Family History Anesthetic complications Brother, Father. Arthritis Brother, Father,  Mother, Sister. Cerebrovascular Accident Brother. Colon Polyps Mother. Depression Brother, Father, Sister. Diabetes Mellitus Brother, Mother, Sister. Heart Disease Father, Sister. Hypertension Brother, Mother, Sister. Prostate Cancer Father.  Pregnancy / Birth History Age at menarche 89 years. Age of menopause <43 Gravida 2 Irregular periods Maternal age 68-25 Para 2  Review of Systems General Present- Weight Gain. Not Present- Appetite Loss, Chills, Fatigue, Fever, Night Sweats and Weight Loss. Skin Not Present- Change in Wart/Mole, Dryness, Hives, Jaundice, New Lesions, Non-Healing Wounds, Rash and Ulcer. HEENT Present- Visual Disturbances. Not Present- Earache, Hearing Loss, Hoarseness, Nose Bleed, Oral Ulcers, Ringing in the Ears, Seasonal Allergies, Sinus Pain, Sore Throat, Wears glasses/contact lenses and Yellow Eyes. Breast Not Present- Breast Mass, Breast Pain, Nipple Discharge and Skin Changes. Cardiovascular Present- Swelling of Extremities. Not Present- Chest Pain, Difficulty Breathing Lying Down, Leg Cramps, Palpitations, Rapid Heart Rate and Shortness of Breath. Gastrointestinal Not Present- Abdominal Pain, Bloating, Bloody Stool, Change in Bowel Habits, Chronic diarrhea, Constipation, Difficulty Swallowing, Excessive gas, Gets full quickly at meals, Hemorrhoids, Indigestion, Nausea, Rectal Pain and Vomiting. Female Genitourinary Not Present- Frequency, Nocturia, Painful Urination, Pelvic Pain and Urgency. Musculoskeletal Present- Joint Pain, Joint Stiffness, Muscle Weakness and Swelling of Extremities. Not Present- Back Pain and Muscle Pain. Neurological Present- Decreased Memory, Tremor and Weakness. Not Present- Fainting, Headaches, Numbness, Seizures, Tingling and Trouble walking. Psychiatric Present- Anxiety. Not Present- Bipolar, Change in Sleep Pattern, Depression, Fearful and Frequent crying. Endocrine Not Present- Cold Intolerance, Excessive Hunger, Hair  Changes, Heat Intolerance, Hot flashes and New Diabetes. Hematology Present- Easy Bruising. Not Present- Excessive bleeding, Gland problems, HIV and Persistent Infections.   Vitals Weight: 202 lb Height: 64in Weight was reported by patient. Height was reported by patient. Body Surface Area: 2.03 m Body Mass Index: 34.67 kg/m Temp.: 98.62F(Oral)  Pulse: 91 (Regular)  BP: 124/82 (Sitting, Left Arm, Standard)  Physical Exam  General - appears comfortable, no distress;  not diaphorectic  HEENT - normocephalic; sclerae clear, gaze conjugate; mucous membranes moist, dentition poor; voice normal; palpable pulse anterior and superior to left ear, nontender  Neck - symmetric on extension; no palpable anterior or posterior cervical adenopathy; no palpable masses in the thyroid bed  Chest - scattered bilateral rhonchi  Cor - regular rhythm with normal rate; no significant murmur  Neuro - grossly intact; no tremor    Assessment & Plan  LEFT FACIAL PRESSURE AND PAIN (784.0  R51)  Patient returns to our practice to discuss temporal artery biopsy. This has been recommended by her neurologist and her medical team. She has persistent left facial pain and left-sided headache.  I again discussed the procedure of temporal artery biopsy. This will be performed as an outpatient procedure. We discussed the location of the surgical incision. We discussed the fact that we would remove a small segment of one of the branches of the artery. Patient understands and wishes to proceed with surgery in the near future.  The risks and benefits of the procedure have been discussed at length with the patient. The patient understands the proposed procedure, potential alternative treatments, and the course of recovery to be expected. All of the patient's questions have been answered at this time. The patient wishes to proceed with surgery.  Earnstine Regal, MD, [facility] Office: [phone]

## 2015-02-08 NOTE — Progress Notes (Signed)
patient states she can not take hydrocodone, Wants ultracet. Magda Paganini RN called Dr Harlow Asa for new RX. He will bring it to patient prior to DC

## 2015-02-08 NOTE — Brief Op Note (Signed)
02/08/2015  8:28 AM  PATIENT:  Karen Waters  64 y.o. female  PRE-OPERATIVE DIAGNOSIS:  left facial pain and headache  POST-OPERATIVE DIAGNOSIS:  same  PROCEDURE:  Procedure(s): LEFT BIOPSY TEMPORAL ARTERY (Left)  SURGEON:  Surgeon(s) and Role:    * Armandina Gemma, MD - Primary  ANESTHESIA:   general  EBL:     BLOOD ADMINISTERED:none  DRAINS: none   LOCAL MEDICATIONS USED:  MARCAINE     SPECIMEN:  Excision  DISPOSITION OF SPECIMEN:  PATHOLOGY  COUNTS:  YES  TOURNIQUET:  * No tourniquets in log *  DICTATION: .Other Dictation: Dictation Number [id number]  PLAN OF CARE: Discharge to home after PACU  PATIENT DISPOSITION:  PACU - hemodynamically stable.   Delay start of Pharmacological VTE agent (>24hrs) due to surgical blood loss or risk of bleeding: yes  Earnstine Regal, MD, [facility] Office: [phone]

## 2015-02-09 ENCOUNTER — Telehealth: Payer: Self-pay | Admitting: Neurology

## 2015-02-09 ENCOUNTER — Other Ambulatory Visit: Payer: Self-pay | Admitting: Radiology

## 2015-02-09 ENCOUNTER — Encounter (HOSPITAL_COMMUNITY): Payer: Self-pay | Admitting: Surgery

## 2015-02-09 NOTE — Telephone Encounter (Signed)
Pt was told by Dr Harlow Asa recommended patient be seen in 2-3 days following biopsy yesterday

## 2015-02-09 NOTE — Telephone Encounter (Signed)
Biopsy was negative for temporal arteritis.

## 2015-02-09 NOTE — Telephone Encounter (Signed)
Spoke w/ pt and she stated she needed to f/u with Dr. Jaynee Eagles in a 2-3 days in the office. Then spoke with Gwyndolyn Saxon who clarified that pt was told to contact Dr. Jaynee Eagles in a few days about results. She did not need to f/u in the office. Told them I will call back about results. He verbalized understanding.

## 2015-02-10 ENCOUNTER — Other Ambulatory Visit: Payer: Self-pay | Admitting: Radiology

## 2015-02-10 NOTE — Telephone Encounter (Signed)
Left detailed message for daughter about biopsy results were normal, negative for temporal arteritis. Gave [facility] phone number and office hours if she had further questions.

## 2015-02-10 NOTE — Telephone Encounter (Signed)
Spoke w/ pt about negative biopsy for temporal arteritis. She verbalized understanding but wanted a f/u appt because she is still having headache. Made appt for 02/17/15 at 2:00pm for check in at 1:45pm. Pt verbalized understanding.

## 2015-02-11 ENCOUNTER — Ambulatory Visit (HOSPITAL_COMMUNITY): Admission: RE | Admit: 2015-02-11 | Payer: Medicare Other | Source: Ambulatory Visit

## 2015-02-17 ENCOUNTER — Ambulatory Visit: Payer: Self-pay | Admitting: Neurology

## 2015-02-21 ENCOUNTER — Telehealth: Payer: Self-pay | Admitting: Neurology

## 2015-02-21 NOTE — Telephone Encounter (Signed)
Spoke w/ pt to let her know Dr. Jaynee Eagles has cleared her from a neurological standpoint but needs to be cleared by her Primary care/surgeon to make sure she does not need to go off of some of her medications. She verbalized understanding.

## 2015-02-21 NOTE — Telephone Encounter (Signed)
Pt called and was wondering when and if it is ok to have back surgery. Please call and advise [phone]

## 2015-02-28 ENCOUNTER — Ambulatory Visit: Payer: Medicare Other | Admitting: Neurology

## 2015-03-01 ENCOUNTER — Ambulatory Visit: Payer: Medicare Other | Admitting: Neurology

## 2015-03-08 ENCOUNTER — Ambulatory Visit: Payer: Medicare Other | Admitting: Neurology

## 2015-03-14 ENCOUNTER — Ambulatory Visit: Payer: Medicare Other | Admitting: Neurology

## 2015-03-16 ENCOUNTER — Telehealth: Payer: Self-pay | Admitting: Adult Health

## 2015-03-16 NOTE — Telephone Encounter (Signed)
Please call pt , she did not follow up for CT chest to follow lung nodule She was called in march and daughter said they would reschedule. Please call her and let her know she need repeat CT chest to follow lung nodule. This is important -let her know this is highly recommended Make sure she has ov with Dr. Chase Caller to discuss CT results.

## 2015-03-16 NOTE — Telephone Encounter (Signed)
Called and spoke with pt Reviewed TP's recs and importance Pt stated she was moving to [facility] this Friday 03/18/15 and would not be able to schedule ov or CT I offered to work her CT in this week and she refused stating she was packing for the move and had no ride there I explained to her the importance of the CT and establishing with a pulmonologist in [facility]  I asked her to inform us when she establishes with a pulmonologist so we can send records  and that if she is able to find a ride this week to call the office so that we could get her worked in for her [place].  Pt voiced understanding and had no further questions.

## 2015-03-17 NOTE — Telephone Encounter (Signed)
Okay. Pt aware to get established asap and will transfer records so she can get CT chest to follow lung nodule .

## 2015-03-18 ENCOUNTER — Ambulatory Visit (HOSPITAL_COMMUNITY): Payer: Medicare Other

## 2015-03-24 ENCOUNTER — Ambulatory Visit: Payer: Medicare Other | Admitting: Neurology

## 2015-03-24 ENCOUNTER — Telehealth: Payer: Self-pay | Admitting: *Deleted

## 2015-03-24 NOTE — Telephone Encounter (Signed)
No showed f/u appointment

## 2015-03-28 ENCOUNTER — Encounter: Payer: Self-pay | Admitting: Neurology

## 2015-03-31 ENCOUNTER — Ambulatory Visit: Payer: Medicare Other | Admitting: Neurology

## 2015-04-29 NOTE — Telephone Encounter (Signed)
Error

## 2015-06-06 IMAGING — CT CT ABD-PELV W/O CM
2 of 3 series · 17 of 36 positions shown, 19 images · non-contrast
Comparison: CT of the abdomen and pelvis 11/17/2014.

CLINICAL DATA: Right-sided flank pain for 1-1/2 weeks. Chronic
constipation.

EXAM:
CT ABDOMEN AND PELVIS WITHOUT CONTRAST
TECHNIQUE: Multidetector CT imaging of the abdomen and pelvis was performed
following the standard protocol without IV contrast.

[Series 4: lung windows · axial · 0.80mm/px · z∈[-70,+24]mm · 14 of 22 slices shown, 16 images]
[im 2/22  soft-tissue]
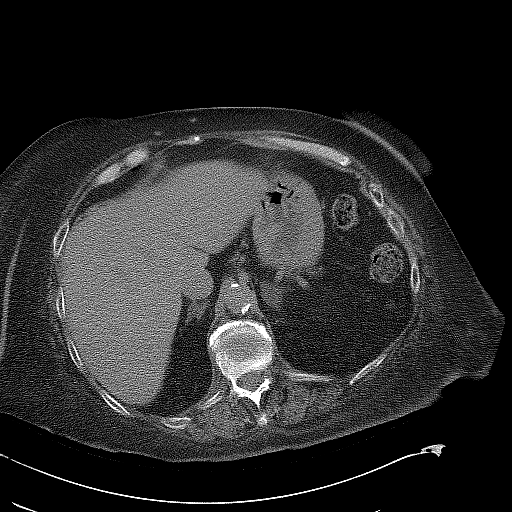
[im 2/22  bone]
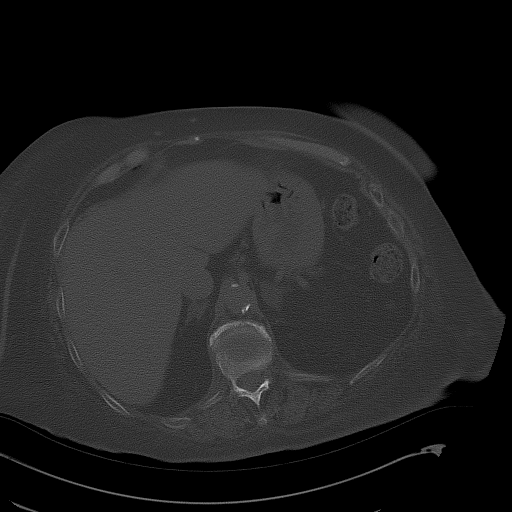
[im 4/22  soft-tissue]
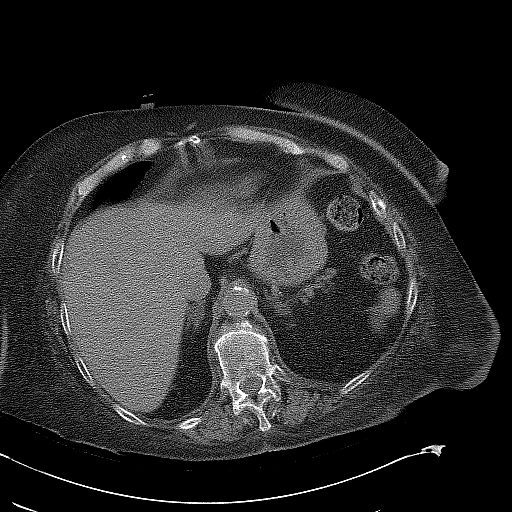
[im 5/22  soft-tissue]
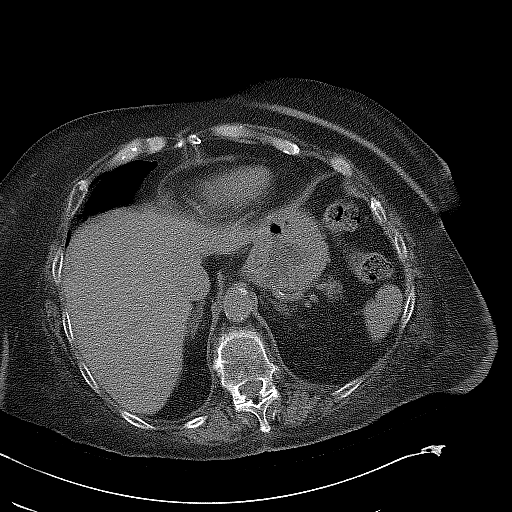
[im 7/22  soft-tissue]
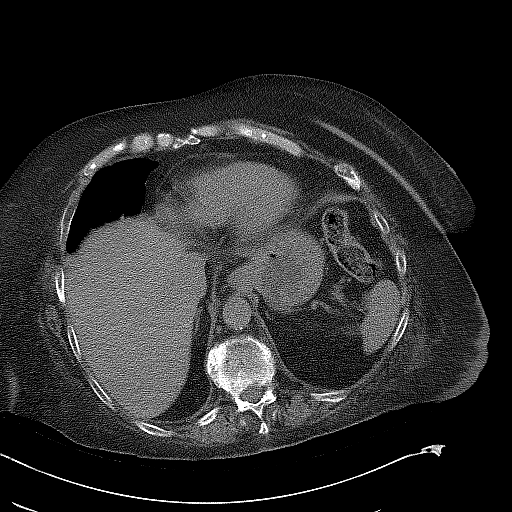
[im 8/22  soft-tissue]
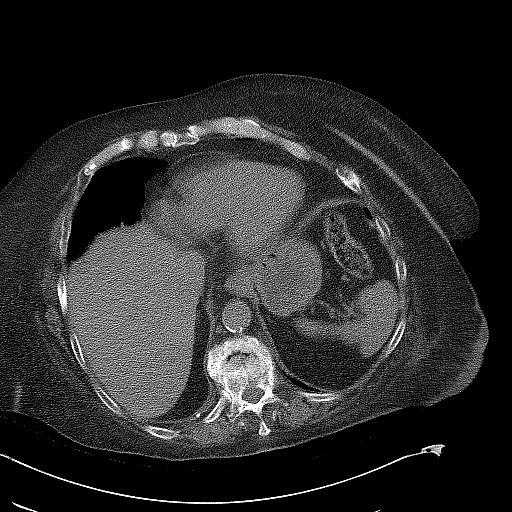
[im 9/22  soft-tissue]
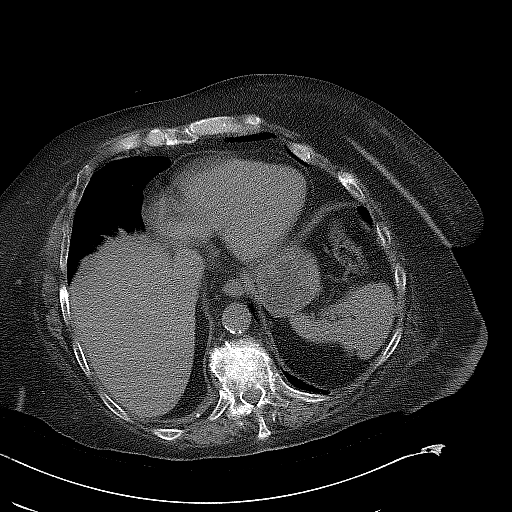
[im 11/22  soft-tissue]
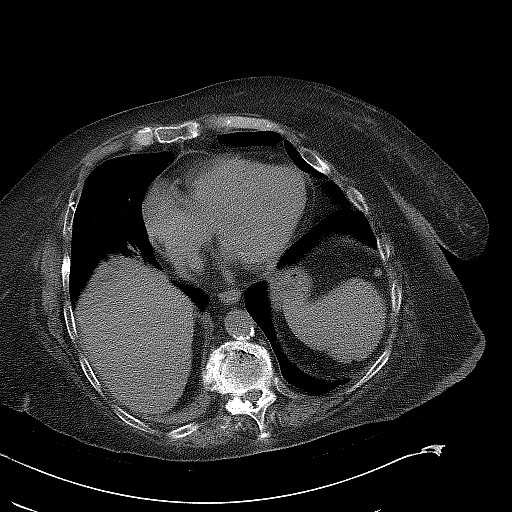
[im 12/22  soft-tissue]
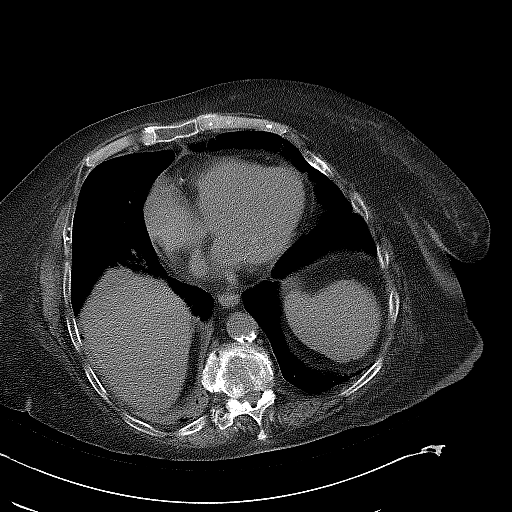
[im 14/22  soft-tissue]
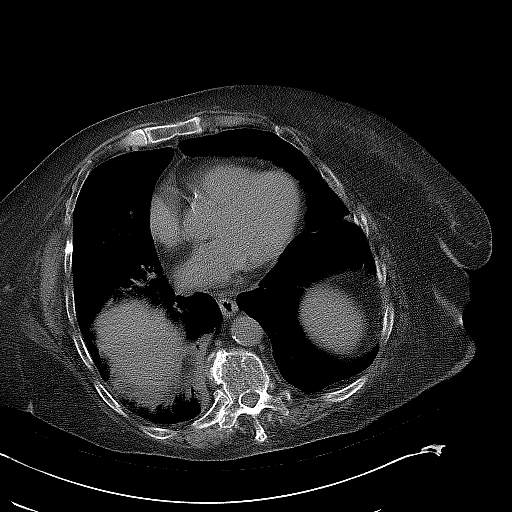
[im 14/22  bone]
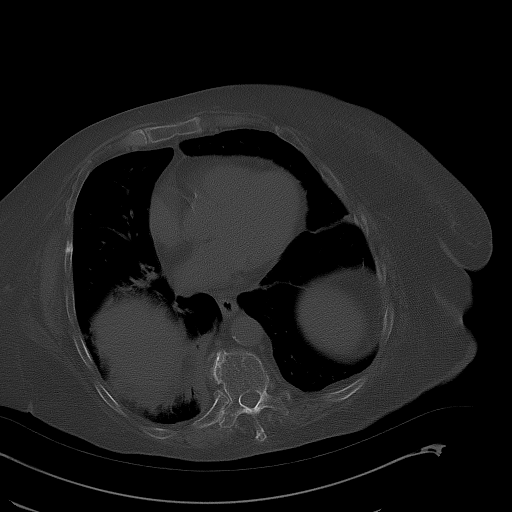
[im 15/22  soft-tissue]
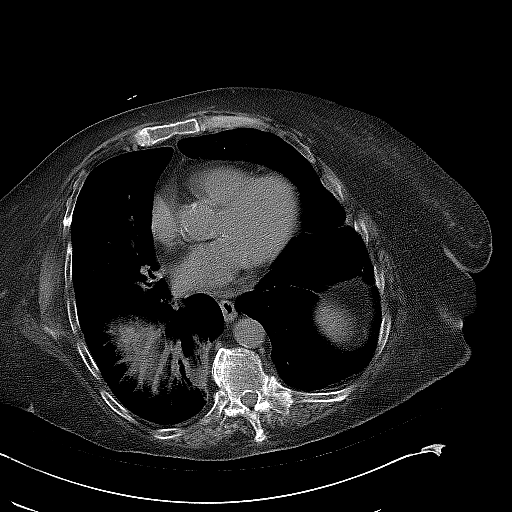
[im 17/22  soft-tissue]
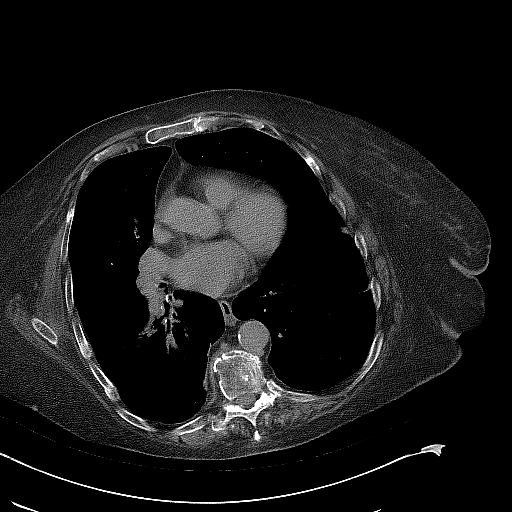
[im 18/22  soft-tissue]
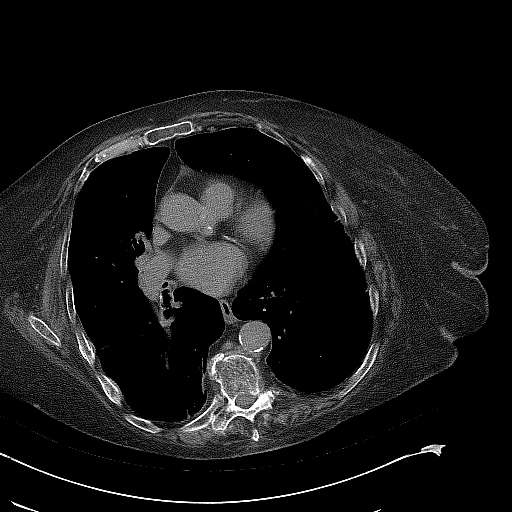
[im 19/22  soft-tissue]
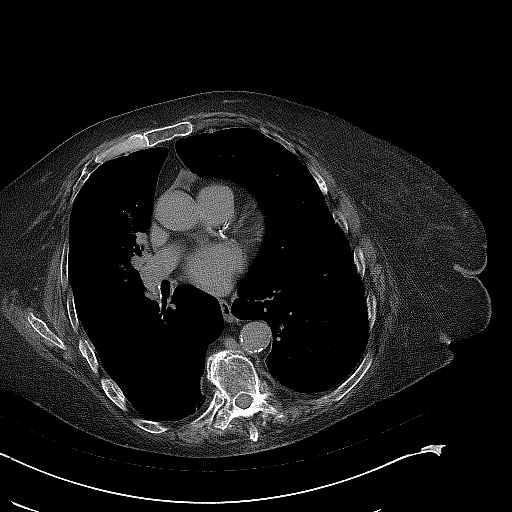
[im 21/22  soft-tissue]
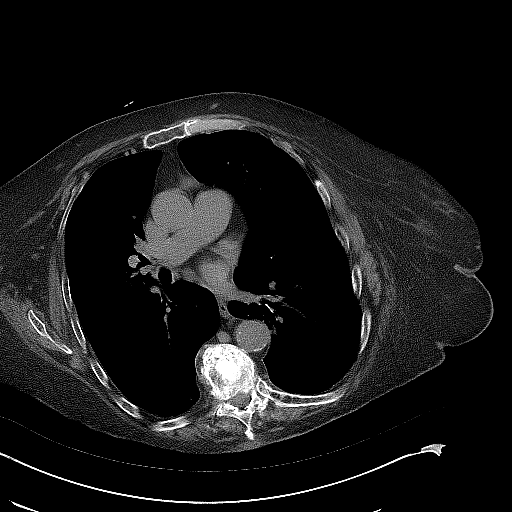

[Series 602: coronal images · coronal · 0.85mm/px · 3 of 85 slices shown]
[im 29/85  soft-tissue]
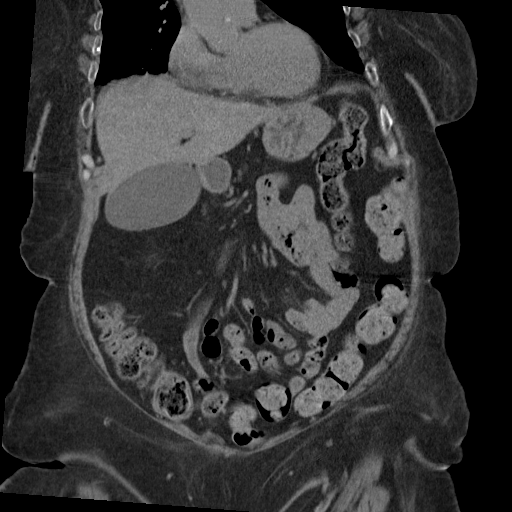
[im 38/85  soft-tissue]
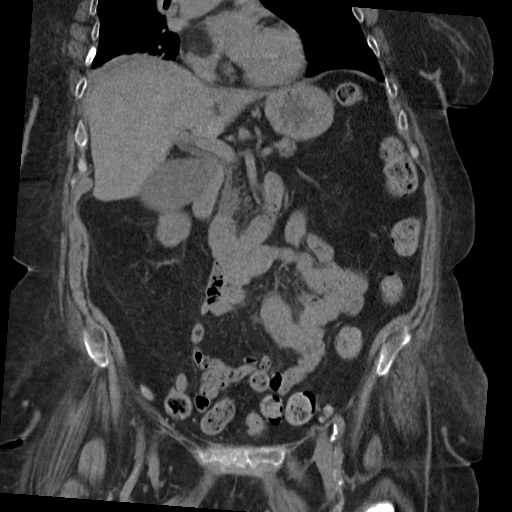
[im 47/85  soft-tissue]
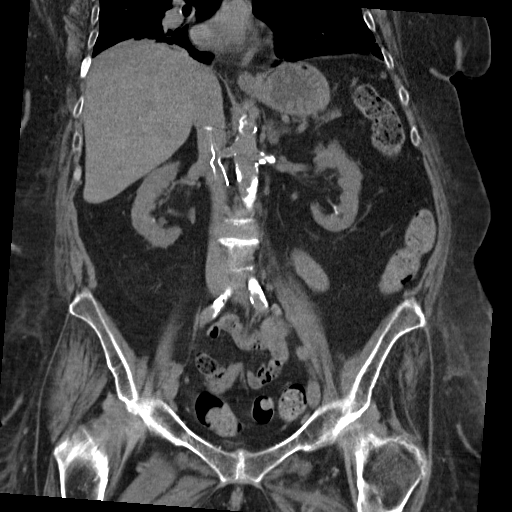

[17 of 36 positions shown; findings below may reference images not displayed]

FINDINGS: Right lower lobe peribronchial thickening and mild airspace
consolidation is evident. The lungs are otherwise clear. Coronary
artery calcifications are again noted. The heart size is normal. No
significant pleural or pericardial effusion is present.

The liver and spleen are within normal limits. The stomach,
duodenum, and pancreas are normal. The common bile duct is within
normal limits. The gallbladder is dilated. No inflammatory changes
are present to suggest cholecystitis. Bilateral adrenal adenomas are
stable. Two punctate nonobstructing stones in the right kidney are
again seen. There 2 punctate nonobstructing stones at the lower pole
of the left kidney. The ureters are within normal limits
bilaterally. Urinary bladder is mostly collapsed.

The rectosigmoid colon is within normal limits. The remainder the
colon is unremarkable. The appendix is visualized and normal. Small
bowel is within normal limits an IVC filter is in place. Extensive
atherosclerotic calcifications are present in the aorta and branch
vessels without aneurysm.

Compression fractures at T11, L1, and L4 are stable. No new
fractures are present. Moderate facet degenerative changes are
present in the lower lumbar spine.
IMPRESSION: 1. Bilateral nonobstructing renal calculi are stable.
2. Bilateral adrenal adenomas.
3. Stable compression fractures at T11, L4, and L1.
4. Extensive atherosclerotic changes without aneurysm.

## 2020-08-30 DEATH — deceased
